# Patient Record
Sex: Male | Born: 1955
Health system: Southern US, Community
[De-identification: ages and names within clinical notes are randomized; demographics above are authoritative.]

## PROBLEM LIST (undated history)

## (undated) DIAGNOSIS — F32A Depression, unspecified: Secondary | ICD-10-CM

## (undated) DIAGNOSIS — R569 Unspecified convulsions: Secondary | ICD-10-CM

## (undated) DIAGNOSIS — I639 Cerebral infarction, unspecified: Secondary | ICD-10-CM

## (undated) DIAGNOSIS — G4730 Sleep apnea, unspecified: Secondary | ICD-10-CM

## (undated) DIAGNOSIS — F329 Major depressive disorder, single episode, unspecified: Secondary | ICD-10-CM

## (undated) DIAGNOSIS — I10 Essential (primary) hypertension: Secondary | ICD-10-CM

## (undated) DIAGNOSIS — M1990 Unspecified osteoarthritis, unspecified site: Secondary | ICD-10-CM

## (undated) DIAGNOSIS — E7800 Pure hypercholesterolemia, unspecified: Secondary | ICD-10-CM

## (undated) HISTORY — PX: BICEPS TENDON REPAIR: SHX566

## (undated) HISTORY — PX: KNEE ARTHROSCOPY: SUR90

## (undated) HISTORY — PX: ANTERIOR CRUCIATE LIGAMENT REPAIR: SHX115

## (undated) HISTORY — PX: NEPHRECTOMY: SHX65

## (undated) HISTORY — PX: CARPAL TUNNEL RELEASE: SHX101

---

## 2000-12-29 ENCOUNTER — Ambulatory Visit (HOSPITAL_BASED_OUTPATIENT_CLINIC_OR_DEPARTMENT_OTHER): Admission: RE | Admit: 2000-12-29 | Discharge: 2000-12-29 | Payer: Self-pay | Admitting: Orthopedic Surgery

## 2002-06-21 ENCOUNTER — Ambulatory Visit (HOSPITAL_BASED_OUTPATIENT_CLINIC_OR_DEPARTMENT_OTHER): Admission: RE | Admit: 2002-06-21 | Discharge: 2002-06-22 | Payer: Self-pay | Admitting: Orthopedic Surgery

## 2003-02-23 ENCOUNTER — Emergency Department (HOSPITAL_COMMUNITY): Admission: EM | Admit: 2003-02-23 | Discharge: 2003-02-23 | Payer: Self-pay | Admitting: Emergency Medicine

## 2004-05-21 ENCOUNTER — Emergency Department (HOSPITAL_COMMUNITY): Admission: EM | Admit: 2004-05-21 | Discharge: 2004-05-21 | Payer: Self-pay | Admitting: Emergency Medicine

## 2004-05-24 ENCOUNTER — Ambulatory Visit (HOSPITAL_COMMUNITY): Admission: RE | Admit: 2004-05-24 | Discharge: 2004-05-24 | Payer: Self-pay | Admitting: Urology

## 2005-01-07 ENCOUNTER — Encounter (INDEPENDENT_AMBULATORY_CARE_PROVIDER_SITE_OTHER): Payer: Self-pay | Admitting: *Deleted

## 2005-01-07 ENCOUNTER — Ambulatory Visit (HOSPITAL_COMMUNITY): Admission: RE | Admit: 2005-01-07 | Discharge: 2005-01-07 | Payer: Self-pay | Admitting: Gastroenterology

## 2007-12-30 ENCOUNTER — Inpatient Hospital Stay
Admission: EM | Admit: 2007-12-30 | Attending: Orthopaedic Surgery of the Spine | Admitting: Orthopaedic Surgery of the Spine

## 2007-12-30 DIAGNOSIS — S52509A Unspecified fracture of the lower end of unspecified radius, initial encounter for closed fracture: Secondary | ICD-10-CM | POA: Insufficient documentation

## 2007-12-30 MED ORDER — BISACODYL 10 MG RECTAL SUPPOSITORY
10.0000 mg | Freq: Two times a day (BID) | RECTAL | Status: DC | PRN
Start: 2007-12-30 — End: 2008-01-01

## 2007-12-30 MED ORDER — ACETAMINOPHEN 325 MG TABLET
650.0000 mg | ORAL_TABLET | ORAL | Status: DC | PRN
Start: 2007-12-30 — End: 2008-01-01

## 2007-12-30 MED ORDER — DOCUSATE SODIUM 100 MG CAPSULE
100.0000 mg | ORAL_CAPSULE | Freq: Two times a day (BID) | ORAL | Status: DC
Start: 2007-12-30 — End: 2008-01-01
  Administered 2007-12-31 – 2008-01-01 (×2): 100 mg via ORAL
  Filled 2007-12-30 (×2): qty 1

## 2007-12-30 MED ORDER — VANCOMYCIN 500 MG INTRAVENOUS SOLUTION
1000.0000 mg | INTRAVENOUS | Status: AC
Start: 2007-12-30 — End: 2007-12-31
  Administered 2007-12-31: 1000 mg via INTRAVENOUS

## 2007-12-30 MED ORDER — LORAZEPAM 2 MG/ML INJECTION SOLUTION
1.0000 mg | INTRAMUSCULAR | Status: DC | PRN
Start: 2007-12-30 — End: 2008-01-01

## 2007-12-30 MED ORDER — CEFAZOLIN 1 GRAM SOLUTION FOR INJECTION
1.0000 g | INTRAMUSCULAR | Status: AC
Start: 2007-12-30 — End: 2007-12-31
  Administered 2007-12-31: 1 g via INTRAVENOUS

## 2007-12-30 MED ORDER — THIAMINE HCL (VITAMIN B1) 100 MG/ML INJECTION SOLUTION
Freq: Once | INTRAMUSCULAR | Status: AC
Start: 2007-12-30 — End: 2007-12-30
  Administered 2007-12-30: 07:00:00 via INTRAVENOUS

## 2007-12-30 MED ORDER — THIAMINE HCL (VITAMIN B1) 100 MG/ML INJECTION SOLUTION
100.0000 mg | Freq: Once | INTRAMUSCULAR | Status: DC
Start: 2007-12-30 — End: 2007-12-30

## 2007-12-30 MED ORDER — MAGNESIUM HYDROXIDE 400 MG/5 ML ORAL SUSPENSION
30.0000 mL | Freq: Two times a day (BID) | ORAL | Status: DC | PRN
Start: 2007-12-30 — End: 2008-01-01

## 2007-12-30 MED ORDER — ONDANSETRON HCL (PF) 4 MG/2 ML INJECTION SOLUTION
4.0000 mg | Freq: Two times a day (BID) | INTRAMUSCULAR | Status: DC | PRN
Start: 2007-12-30 — End: 2008-01-01

## 2007-12-30 MED ORDER — MORPHINE 2 MG/ML INJECTION SYRINGE
4.0000 mg | INJECTION | Freq: Once | INTRAMUSCULAR | Status: AC
Start: 2007-12-30 — End: 2007-12-30
  Administered 2007-12-30: 4 mg via INTRAVENOUS

## 2007-12-30 MED ORDER — DIPHENHYDRAMINE 50 MG/ML INJECTION SOLUTION
25.0000 mg | Freq: Four times a day (QID) | INTRAMUSCULAR | Status: DC | PRN
Start: 2007-12-30 — End: 2008-01-01

## 2007-12-30 MED ORDER — MULTIVITAMIN INTRAVENOUS
10.0000 mL | INJECTION | Freq: Once | INTRAVENOUS | Status: DC
Start: 2007-12-30 — End: 2007-12-30

## 2007-12-30 MED ORDER — MORPHINE 2 MG/ML INJECTION SYRINGE
2.0000 mg | INJECTION | Freq: Once | INTRAMUSCULAR | Status: AC
Start: 2007-12-30 — End: 2007-12-30
  Administered 2007-12-30 (×2): 4 mg via INTRAVENOUS
  Administered 2007-12-30: 2 mg via INTRAVENOUS

## 2007-12-30 MED ORDER — HYDROMORPHONE (PF) 1 MG/ML INJECTION SYRINGE
0.4000 mg | INJECTION | INTRAMUSCULAR | Status: DC | PRN
Start: 2007-12-30 — End: 2008-01-01
  Administered 2007-12-30: 2 mg via INTRAVENOUS
  Administered 2007-12-31 (×4): 1 mg via INTRAVENOUS
  Filled 2007-12-30 (×5): qty 1

## 2007-12-30 MED ORDER — D5 / 0.45% NACL W KCL 20 MEQ/L IV SOLUTION
INTRAVENOUS | Status: DC
Start: 2007-12-30 — End: 2008-01-01
  Administered 2007-12-30 – 2007-12-31 (×3): via INTRAVENOUS

## 2007-12-30 MED ORDER — OXYCODONE-ACETAMINOPHEN 5 MG-325 MG TABLET
1.0000 | ORAL_TABLET | ORAL | Status: DC | PRN
Start: 2007-12-30 — End: 2008-01-01
  Administered 2007-12-30 – 2008-01-01 (×8): 2 via ORAL
  Filled 2007-12-30 (×8): qty 2

## 2007-12-30 MED ORDER — FOLIC ACID 5 MG/ML INJECTION SOLUTION
1.0000 mg | Freq: Once | INTRAMUSCULAR | Status: DC
Start: 2007-12-30 — End: 2007-12-30

## 2007-12-30 NOTE — ED Nursing Note (Signed)
Pt resting more comfortably at present, awaiting ortho consult.  Wife at [facility], cont to monitor, CSM rem intact.

## 2007-12-30 NOTE — Progress Notes (Signed)
PATIENT: Jeremiah Glover, Jeremiah Glover LOCATIONCoy [place]as  MR #: [id number] SEX: M AGE: 52  DATE OF SERVICE: 12/30/2007 DOB: February 15, 1956      EMERGENCY DEPARTMENT NOTE    This patient was seen and examined concurrently with Dr. Cyndra Numbers. The history, past medical history, review of systems, and physical exam were reviewed and discussed, and a care plan was developed together.     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 He had no loss of consciousness or any other injuries. He complains of severe, sharp, nonradiating, right wrist pain that hurts worse with any palpation or movement.    PAST MEDICAL HISTORY:    Hyperthyroidism. SURGICAL HISTORY: None. MEDICATIONS: None. ALLERGIES: None.    FAMILY HISTORY: Noncontributory.    SOCIAL HISTORY:    Positive alcohol.     REVIEW OF SYSTEMS:     A complete ROS is negative for everything except for right wrist pain.    PHYSICAL EXAM:     Vital signs: Heart rate 96, blood pressure 130/84, respirations 16, temperature 36.6. General: He is comfortable and conversational. HEENT: Conjunctivae pink, anicteric. Oropharynx normal. Neck: Supple. Chest: Clear. Heart: Regular rate and rhythm. Abdomen: Soft, nontender. Pelvis nontender. Extremities: Atraumatic, nontender except for the right wrist, which has a visible dorsal deformity and is tender over the entire wrist. His elbow and hand are nontender. He has no shoulder tenderness. He has intact distal pulses and compartments are soft. Neuro: GCS 15. Sensation: Motor exam normal all extremities, including median, radial, and ulnar nerves of the right hand.     ASSESSMENT AND PLAN:    Right wrist fracture. We will evaluate him with x-rays and consult orthopaedics for reduction and followup.    ED COURSE:    His x-ray showed a comminuted, intraarticular, distal radius fracture with significant dorsal displacement and angulation. Orthopaedics was consulted and initially did a hematoma  block and hung him for 15 minutes of traction. We then did procedural sedation with Brevital without complications and orthopaedics reduced the fracture and splinted him with a reverse sugar-tong splint. He was admitted to orthopaedics for further management and operative care.    FINAL ED DIAGNOSIS:    Right distal radius intraarticular, comminuted, displaced, and angulated fracture.            THIS WAS ELECTRONICALLY SIGNED - 12/30/2007 6:59 AM PST BY: Diego Cory, MD  ASSOCIATE CLINICAL [facility]  EMERGENCY MEDICINE DEPARTMENT              [id number])    D: 12/30/2007 06:26 AM  T: 12/30/2007 06:33 AM  C#: [id number]

## 2007-12-30 NOTE — ED Triage Note (Signed)
Rt wrist pain, fell while skating, edema noted. Strong pulse noted, fingers pink with 2 sec capillary refillm cms intact. Ice packed to area involved. Occurred 45 mins ago

## 2007-12-30 NOTE — ED Nursing Note (Signed)
Verbal order received from ortho to give morphine 4mg  ivp. Pt awake, c/o 10/10 right wrist pain.

## 2007-12-30 NOTE — ED Nursing Note (Signed)
Procedural sedation tolerated well.

## 2007-12-30 NOTE — ED Nursing Note (Signed)
Procedural sedation initiated. ED attending at bedside.

## 2007-12-30 NOTE — ED Nursing Note (Signed)
Pt BIB by wife s/p fall while roller skating. Pt w/ deformity, pain and swelling to right wrist, +CSM, hand is warm, cap refill brisk, pt in obvious discomfort, wife at [facility], no other injuries, no other complaints, no LOC.

## 2007-12-30 NOTE — ED Nursing Note (Signed)
Pt awake, foolows commands. To ct scan.

## 2007-12-30 NOTE — ED Nursing Note (Signed)
Ortho at bedside.

## 2007-12-30 NOTE — ED Nursing Note (Signed)
Pt back from xray in NAD at present, some relief noted after morphine.  CSM remains intact.  Ortho paged.

## 2007-12-30 NOTE — ED Nursing Note (Signed)
Back from xray. Unchanged assessments.

## 2007-12-30 NOTE — ED Nursing Note (Signed)
Medicated with 2 mg of morphine for 8/10 pain at right wrist.

## 2007-12-30 NOTE — ED Nursing Note (Signed)
Pt resting comfortably. Awaiting dispo

## 2007-12-31 MED ORDER — MEPERIDINE (PF) 50 MG/ML INJECTION SOLUTION
25.0000 mg | INTRAMUSCULAR | Status: DC | PRN
Start: 2007-12-31 — End: 2007-12-31

## 2007-12-31 MED ORDER — LACTATED RINGERS IV INFUSION
INTRAVENOUS | Status: DC
Start: 2007-12-31 — End: 2007-12-31
  Administered 2007-12-31: 10:00:00 via INTRAVENOUS

## 2007-12-31 MED ORDER — LACTATED RINGERS IV INFUSION
INTRAVENOUS | Status: DC
Start: 2007-12-31 — End: 2007-12-31

## 2007-12-31 MED ORDER — HEPARIN, PORCINE (PF) 5,000 UNIT/0.5 ML INJECTION SYRINGE
5000.0000 [IU] | INJECTION | Freq: Three times a day (TID) | INTRAMUSCULAR | Status: DC
Start: 2007-12-31 — End: 2008-01-01
  Administered 2007-12-31 – 2008-01-01 (×3): 5000 [IU] via SUBCUTANEOUS
  Filled 2007-12-31 (×3): qty 1

## 2007-12-31 MED ORDER — VANCOMYCIN 500 MG INTRAVENOUS SOLUTION
1000.0000 mg | Freq: Two times a day (BID) | INTRAVENOUS | Status: AC
Start: 2007-12-31 — End: 2007-12-31
  Administered 2007-12-31: 1000 mg via INTRAVENOUS
  Filled 2007-12-31: qty 20

## 2007-12-31 MED ORDER — ONDANSETRON HCL (PF) 4 MG/2 ML INJECTION SOLUTION
4.0000 mg | INTRAMUSCULAR | Status: DC | PRN
Start: 2007-12-31 — End: 2007-12-31

## 2007-12-31 MED ORDER — PROMETHAZINE 25 MG/ML INJECTION SOLUTION
12.5000 mg | INTRAMUSCULAR | Status: DC | PRN
Start: 2007-12-31 — End: 2007-12-31

## 2007-12-31 MED ORDER — VANCOMYCIN 1 GRAM/200 ML IN DEXTROSE 5 % INTRAVENOUS PIGGYBACK
INJECTION | INTRAVENOUS | Status: AC
Start: 2007-12-31 — End: ?
  Filled 2007-12-31: qty 200

## 2007-12-31 MED ORDER — ALBUTEROL SULFATE 2.5 MG/3 ML (0.083 %) SOLUTION FOR NEBULIZATION
2.5000 mg | INHALATION_SOLUTION | RESPIRATORY_TRACT | Status: DC | PRN
Start: 2007-12-31 — End: 2007-12-31

## 2007-12-31 MED ORDER — HYDROMORPHONE (PF) 1 MG/ML INJECTION SYRINGE
0.4000 mg | INJECTION | INTRAMUSCULAR | Status: DC | PRN
Start: 2007-12-31 — End: 2007-12-31

## 2007-12-31 MED ORDER — HYDROMORPHONE (PF) 1 MG/ML INJECTION SYRINGE
0.2000 mg | INJECTION | INTRAMUSCULAR | Status: DC | PRN
Start: 2007-12-31 — End: 2007-12-31

## 2007-12-31 MED ORDER — MEPERIDINE (PF) 50 MG/ML INJECTION SOLUTION
12.5000 mg | INTRAMUSCULAR | Status: DC | PRN
Start: 2007-12-31 — End: 2007-12-31

## 2007-12-31 MED ORDER — CEFAZOLIN 1 GRAM SOLUTION FOR INJECTION
1.0000 g | Freq: Three times a day (TID) | INTRAMUSCULAR | Status: AC
Start: 2007-12-31 — End: 2008-01-01
  Administered 2007-12-31 – 2008-01-01 (×2): 1 g via INTRAVENOUS
  Filled 2007-12-31 (×2): qty 1

## 2007-12-31 NOTE — Procedures (Addendum)
PATIENT: Jeremiah Glover, Jeremiah Glover LOCATION:   MR #: [id number] SEX: M AGE: 52  OPERATION DATE: 12/31/2007 DOB: [birth year]    INPATIENT OPERATION RECORD    PREOPERATIVE DIAGNOSIS:    Right intraarticular distal radius fracture.     POSTOPERATIVE DIAGNOSIS: Same.    PROCEDURE PERFORMED:    Open reduction and internal fixation of right distal radius fracture.    CPT CODE: 09811   ANESTHESIA: General.  FINDINGS: As above.  SPECIMENS REMOVED: None.  EBL: 50 mL.  DRAINS: None.  FLUIDS: Per anesthesia.  COMPLICATIONS: None.  OUTCOME: Satisfactory.    INDICATIONS:    The patient is a gentleman who is status post fall onto his right upper extremity. Sustained a right distal radius fracture. Seen in the emergency department and felt to be a good candidate for surgical stabilization.     DETAILS OF OPERATION:    After consent was obtained and preoperative antibiotics were given, the patient was taken to the operating room, where general anesthesia was induced. His right arm was then prepped and draped in the usual sterile fashion. Next, we then performed surgical pause, confirming surgical site, procedure, and the patient. This was confirmed by all surgical team members in attendance. Next, we used a standard FCR approach. Dissected sharply through the skin down to the level of the FCR. We dissected through the FCR tendon sheath, reflected the tendon laterally. We exposed our FDS fascia, incised the fascia longitudinally, and then reflected the FDS off of the distal radius down to the pronator space and identifying pronator quadratus. We then reflected the pronator quadratus with an L incision off the distal radius. We carefully dissected around the distal radius, releasing some of the brachioradialis to the tip of the radial styloid. We placed deep retractors, identified our fracture fragments. We cleaned out our fracture fragments with curette and irrigation. We then achieved manual reduction and placed provisional fixation with 1.6  K-wire fixation for the radial styloid tip into the radial shaft. We then prepared for plating of our distal radius fracture using a [employer] plate. We affixed the plate proximally with locking screws, checking length and screw positioning and making sure they were extraarticular and just below the subchondral surface on the AP and lateral fluoroscopic views. We then placed our proximal shaft screws, bicortical in nature, with three proximal screws. Once all the screws were placed, we checked fluoroscopy in a biplanar fashion. All screw lengths were appropriate. There was no evidence of intraarticular screw placement. The patient had good range of motion of the wrist, full supination and pronation. There was no evidence of DRUJ disruption. His radial pulse was intact. We then irrigated the deep wounds. We closed the PQ over the plate with 0 Vicryl. We then closed the subcu with 2-0 Vicryl in an inverted fashion. Closed with skin with 3-0 nylon in vertical mattress fashion. A sterile dressing was placed and a volar splint was applied. The patient was then awakened from anesthesia and transferred to the postanesthesia care unit in stable condition.     Postoperatively he will be nonweightbearing on the right upper extremity, but range of motion as tolerated once the splint comes down. He will be discharged to home when comfortable and follow up in orthopedic clinic in one week post discharge.         THIS WAS ELECTRONICALLY SIGNED - 01/17/2008 3:59 PM PST BY: MARK A LEE, MD  SURGEON      THIS WAS ELECTRONICALLY SIGNED - 01/11/2008  9:49 AM PST BY: Ann Held, MD  ASSISTANT SURGEON   DEPARTMENT OF ORTHOPAEDIC SURGERY      DR. Serita Kyle  ASSISTANT SURGEON       [id number])    D: 12/31/2007 12:55 PM  T: 12/31/2007 06:08 PM  C#: [id number]

## 2008-01-01 LAB — BASIC METABOLIC PANEL
Calcium: 8.6 mg/dL (ref 8.6–10.5)
Carbon Dioxide Total: 32 meq/L (ref 24–32)
Chloride: 102 meq/L (ref 95–110)
Creatinine Blood: 1.24 mg/dL (ref 0.44–1.27)
E-GFR, African American: >60 SEE NOTE (ref >60)
E-GFR, Non-African American: >60 SEE NOTE (ref >60)
Glucose: 104 mg/dL (ref 70–110)
Potassium: 3.8 meq/L (ref 3.3–5.0)
Sodium: 140 meq/L (ref 135–145)
Urea Nitrogen, Blood (BUN): 9 mg/dL (ref 8–22)

## 2008-01-01 LAB — CBC WITH DIFFERENTIAL
Basophils % Auto: 0.3 %
Basophils Abs Auto: 0 K/MM3 (ref 0–0.2)
Eosinophils % Auto: 0.3 %
Eosinophils Abs Auto: 0 K/MM3 (ref 0–0.5)
Hematocrit: 41 % (ref 41–53)
Hemoglobin: 13.2 g/dL — ABNORMAL LOW (ref 13.5–17.5)
Lymphocytes % Auto: 28.9 %
Lymphocytes Abs Auto: 3.5 K/MM3 (ref 1.0–4.8)
MCH: 24.3 pg — ABNORMAL LOW (ref 27–33)
MCHC: 32.2 % (ref 32–36)
MCV: 75.4 UM3 — ABNORMAL LOW (ref 80–100)
MPV: 9.2 UM3 (ref 6.8–10.0)
Monocytes % Auto: 7.9 %
Monocytes Abs Auto: 1 K/MM3 — ABNORMAL HIGH (ref 0.1–0.8)
Neutrophils % Auto: 62.6 %
Neutrophils Abs Auto: 7.7 K/MM3 (ref 1.80–7.70)
Platelet Count: 161 K/MM3 (ref 130–400)
RDW: 14.3 U (ref 0–14.7)
Red Blood Cell Count: 5.44 M/MM3 (ref 4.5–5.9)
White Blood Cell Count: 12.2 K/MM3 — ABNORMAL HIGH (ref 4.5–11.0)

## 2008-01-01 MED ORDER — OXYCODONE-ACETAMINOPHEN 5 MG-325 MG TABLET
ORAL_TABLET | ORAL | Status: AC
Start: 2008-01-01 — End: ?

## 2008-01-01 MED ORDER — DOCUSATE SODIUM 100 MG CAPSULE
ORAL_CAPSULE | ORAL | Status: AC
Start: 2008-01-01 — End: ?

## 2008-01-01 NOTE — Patient Instructions (Signed)
Keep your splint clean and dry at all times.  Elevate at the level of the heart while at rest.  Wear the sling for comfort while walking.  Remain non weight bearing on your right arm.    Should you develop increasing numbness or tingling in your hand or arm, please notify your orthopedic surgeon right away or go to emergency for evaluation.  Monitor for symptoms of infection:  Fever, warmth, redness, swelling of the hand or digits, any concerning symptoms, please call your orthopedic doctor right away or come to emergency for evaluation.

## 2008-01-01 NOTE — Discharge Summary (Addendum)
PATIENT: Jeremiah Glover, Jeremiah Glover LOCATION: [id number]  MR #: [id number] SEX: M AGE: 52   DOB: 10/15/1956  ADMISSION DATE: 12/30/2007 DISCHARGE DATE: 01/01/2008    INPATIENT DISCHARGE SUMMARY    ADMISSION DIAGNOSIS:    Right intra-articular distal radius fracture.    DISCHARGE DIAGNOSIS:    Same.    PROCEDURES:  12/31/07, ORIF Right distal radius, Dr. Laurell Roof.    PERTINENT IMAGINGS:    December 30, 2007, x-ray right wrist, comminuted intra-articular fracture, right distal radius; probable injury to the distal radioulnar joint. X-ray, right hand, with acute intra-articular comminuted fracture of the distal radius. X-ray elbow: No traumatic abnormalities, osteoarthritic changes. X-ray right forearm, acute comminuted intra-articular fracture of the distal radius. CT upper extremity: Status post closed reduction, intra-articular distal radius fracture.    December 31, 2007, x-ray right wrist, status post open reduction and internal fixation of the distal radius, near anatomic.    CONSULTATIONS: None.    COMPLICATIONS:    None identified.    HISTORY OF PRESENT ILLNESS:    Mr. Jeremiah Glover is a 52 year old man who fell onto a right outstretched hand on December 30, 2007, while roller skating. He was taken to the OR the following day for surgical stabilization.    PAST MEDICAL HISTORY:    Reveals no known drug allergies, history of hypothyroidism. No past surgery or medications were noted. Social habits notable for alcohol.    HOSPITAL COURSE:    Jeremiah Glover remained in the hospital overnight on January 26 after his surgery. He was recommended to be non-weight-bearing on his right upper extremity in a short arm splint with a sling for comfort. Estimated blood loss was 50 mL. He was recommended to follow up in the orthopedic clinic in approximately two weeks for evaluation. I discussed the signs of postoperative wound infection for which he should monitor himself for. I advised him to call the orthopedic clinic right away or come to the  emergency for any concerning symptoms. He was instructed to keep the splint clean and dry. He did report some numbness and tingling to the fourth and fifth fingers; however, he was able to move all fingers. I discussed with him the importance of finger range of motion after his hospital discharge to avoid stiffness.    Review of vital signs today shows that he is afebrile with temperature of 36.7, blood pressure 135/87, pulse 81, respirations 18, satting 96% on room air. Pain scores range from 4 to 7/10, which was well tolerated with oral pain medications. Jeremiah Glover was discharged home today accompanied by his wife in stable condition with pain adequately controlled. He was tolerating a regular diet, voiding adequately, and no complaints of nausea or vomiting.    PLAN:    Discharge home.    CONDITION: Stable.    ACTIVITY:    Non-weight-bearing right upper extremity, keeping the short-arm splint clean and dry, utilizing a sling for comfort.    MEDICATIONS PROVIDED AT DISCHARGE:    1. Percocet 5/325 mg tablets one to two p.o. q.4 hours p.r.n. pain, #80.  2. Colace 100 mg tablets p.o. b.i.d., #60.        THIS WAS ELECTRONICALLY SIGNED - 01/17/2008 3:57 PM PST BY: MARK A LEE, MD    THIS WAS ELECTRONICALLY SIGNED - 01/16/2008 3:17 PM PST BY: Leo Grosser, NP  NURSE PRACTITIONER  DEPARTMENT OF ORTHOPAEDIC SURGERY      [id number])    D: 01/01/2008  T: 01/01/2008 09:35 PM   C#: [id number]

## 2008-01-03 ENCOUNTER — Ambulatory Visit

## 2008-01-11 ENCOUNTER — Ambulatory Visit

## 2008-01-11 ENCOUNTER — Ambulatory Visit: Admitting: Orthopaedic Trauma

## 2008-01-11 NOTE — Progress Notes (Signed)
This patient was seen, evaluated, and care plan was developed with the resident. I agree with the findings and plan as outlined in the resident's note above.     Olevia Bowens

## 2008-01-11 NOTE — Nursing Note (Signed)
>>   Jeremiah Glover     Fri Jan 11, 2008 12:09 PM  Online order # [id number] for OOP exam.  Removed SAS in CR5 for exam.    Voice Order for suture removal,steri-strips and wrist splint.    Removed Sutures and applied steri-strips. Freedom wrist splint applied on wrist.  Return as scheduled.  Jeremiah Glover  Sr. Ortho Tech

## 2008-01-17 NOTE — Progress Notes (Addendum)
PATIENT: Jeremiah Glover, Jeremiah Glover LOCATIONManson Passey   MR #: [id number] SEX: M AGE: 52  DATE OF SERVICE: 01/11/2008 DOB: 1956/06/05    ORTHOPAEDICS CLINIC NOTE    ATTENDING PHYSICIAN: Dr. Laurell Roof.    ID: Jeremiah Glover is a 52 year old gentleman who sustained a right distal radius fracture. He underwent open reduction/internal fixation of his right distal radius on December 31, 2007. He is here for a wound check follow-up visit.    SUBJECTIVE: The patient states that he has been doing well since the time of his discharge. He has no complaints of fevers, sweats, or chills. He states that his pain has been well controlled with Percocet. He is also requesting a refill on Percocet.    OBJECTIVE: On physical examination, his incision is clean, dry, and intact, with sutures in place. There is no erythema. There is no warmth or drainage at the site of his incision. Distally he has good sensation to light touch in the median, ulnar, and radial nerve distributions and is able to wiggle his fingers. He has decreased range of motion in his wrist and fingers secondary to being immobilized.    IMAGING STUDIES: X-rays were not done today, as he is here for a postop check.    ASSESSMENT & PLAN: Jeremiah Glover is a 52 year old gentleman with a right distal radius fracture status post open reduction/internal fixation. He is now a week and a half out. At this time, we will start him on range of motion exercises. We will have him follow up in five weeks with x-rays. We feel like he is doing well at this time. We have instructed him that he should return if he has increased pain or swelling and erythema to his right wrist.        THIS WAS ELECTRONICALLY SIGNED - 01/16/2008 9:15 AM PST BY: Wilma Flavin, MD  RESIDENT  DEPARTMENT OF ORTHOPAEDIC SURGERY        THIS WAS ELECTRONICALLY SIGNED - 01/17/2008 3:57 PM PST BY: Elta Guadeloupe A LEE, MD      [id number])    D: 01/11/2008 09:37 AM  T: 01/11/2008 10:44 AM  C#: [id number]

## 2008-02-15 ENCOUNTER — Ambulatory Visit: Admitting: Orthopaedic Trauma

## 2008-02-15 ENCOUNTER — Other Ambulatory Visit: Payer: Self-pay | Admitting: Orthopaedic Trauma

## 2008-02-15 ENCOUNTER — Ambulatory Visit

## 2008-02-15 MED ORDER — VICODIN 5 MG-500 MG TABLET
ORAL_TABLET | ORAL | Status: AC
Start: 2008-02-15 — End: ?

## 2008-02-15 NOTE — Progress Notes (Addendum)
Addended by: Dina Rich on: 02/15/2008 10:07:01 AM     Modules accepted: Orders

## 2008-02-15 NOTE — Progress Notes (Addendum)
This patient was seen, evaluated, and care plan was developed with the resident. I agree with the findings and plan as outlined in the resident's note above.     Gautam Langhorst,  M.D.

## 2008-02-15 NOTE — Nursing Note (Signed)
>>   Jeremiah Glover     Fri Feb 15, 2008 11:26 AM  Written Order for new freedom wrist splint.  Splint applied.  Return as scheduled.  Jeremiah Glover  Sr. Ortho Tech    >> AMY CROWTHER, MA     Fri Feb 15, 2008  9:16 AM  Screened for pain, allergies reviewed, pharmacy info updated.  Cyndee Brightly MA

## 2008-02-26 NOTE — Progress Notes (Addendum)
PATIENT: Jeremiah Glover, Jeremiah Glover LOCATIONManson Passey   MR #: [id number] SEX: M AGE: 52  DATE OF SERVICE: 02/15/2008 DOB: 09/17/1956    ORTHOPAEDICS CLINIC NOTE    SUBJECTIVE: Mr. Lampman is a 52 year old man who sustained a right distal radius fracture and underwent an open reduction and internal fixation of a right distal radius fracture on 12/31/07. He is now here 8 weeks after this. He has been wearing a Freedom wrist splint and using his wrist minimally. He states his pain has been improving with time but he does have some mild achiness with increasing use.     OBJECTIVE: On exam the incision is well healed. He flexes and extends all of his fingers without evidence of tendon rupture. Sensation is intact to light touch to the superficial nerves of the hand. He is motor intact in median, ulnar, and radial nerves.    RADIOGRAPHS: Radiographs of the wrists show volar lock plate in good position. No interval loss of fixation. There has been interval healing of the fracture.    ASSESSMENT AND PLAN: Eight weeks after open reduction and internal fixation of right distal radius fracture. He also has approximately 10 degrees of dorsiflexion and 10 degrees or volar flexion. We will have him placed in hand therapy to work on wrist range of motion and strengthening. He should not lift more than five pounds with this extremity and we advised him not to return to golf at this time as his fracture is not stable enough to withstand that. We will have him come back in six weeks' time at which point we will repeat radiographs of his right wrist. If his range of motion, strength and fracture consolidation improve then he may consider advancing his weightbearing and activities at that time. This patient is seen and examined with Dr. Laurell Roof. He is also provided with a new Freedom wrist splint today.           THIS WAS ELECTRONICALLY SIGNED - 02/26/2008 11:53 AM PST BY: Serita Kyle, MD  RESIDENT  DEPARTMENT OF ORTHOPAEDIC SURGERY        THIS WAS  ELECTRONICALLY SIGNED - 02/26/2008 4:31 PM PST BY: MARK A LEE, MD      [id number])    D: 02/15/2008 02:25 PM  T: 02/17/2008 09:52 AM  C#: [id number]

## 2008-03-28 ENCOUNTER — Ambulatory Visit

## 2008-03-28 ENCOUNTER — Other Ambulatory Visit: Payer: Self-pay | Admitting: Orthopaedic Trauma

## 2008-03-28 ENCOUNTER — Ambulatory Visit: Admitting: Orthopaedic Trauma

## 2008-03-28 VITALS — BP 142/95 | HR 73 | Resp 18

## 2008-03-28 NOTE — Nursing Note (Signed)
>>   Elam Dutch, RN     Ludwig Clarks Mar 28, 2008  9:24 AM  Pharmacy/allergies verified, screened for pain.   Elam Dutch, RN

## 2008-04-10 NOTE — Progress Notes (Addendum)
PATIENT: Jeremiah Glover, Jeremiah Glover LOCATIONManson Passey   MR #: [id number] SEX: M AGE: 52  DATE OF SERVICE: 03/28/2008 DOB: 25-Sep-1956    ORTHOPAEDICS CLINIC NOTE    ATTENDING: Dr. Truman Hayward, [facility].    SUBJECTIVE: The patient is a 52 year old man who is right-handed. He had a right distal radius fracture. He is now three months out. He is doing great. He has no problems, no issues.    OBJECTIVE EXAM: His motion is almost identical to his contralateral side. He has no pain to palpation. His incisions have healed.    X-RAYS: X-rays show healing of his fracture. Hardware is in place. Joint looks fine.    ASSESSMENT AND PLAN: Healed distal radius fracture. Patient should be cleared for activities as tolerated. He may wean himself off of his Freedom wrist splint. We will see him back on a p.r.n. basis.          THIS WAS ELECTRONICALLY SIGNED - 03/28/2008 1:27 PM PST BY: Leticia Penna, MD  FELLOW  DIVISION OF ORTHOPAEDIC TRAUMA  DEPARTMENT OF ORTHOPAEDIC SURGERY        THIS WAS ELECTRONICALLY SIGNED - 04/10/2008 1:04 PM PST BY: MARK A LEE, MD      [id number])    D: 03/28/2008 10:20 AM  T: 03/28/2008 11:10 AM  C#: [id number]

## 2010-12-14 ENCOUNTER — Ambulatory Visit (HOSPITAL_COMMUNITY)
Admission: RE | Admit: 2010-12-14 | Discharge: 2010-12-14 | Payer: Self-pay | Source: Home / Self Care | Attending: Urology | Admitting: Urology

## 2010-12-22 ENCOUNTER — Inpatient Hospital Stay (HOSPITAL_COMMUNITY)
Admission: RE | Admit: 2010-12-22 | Discharge: 2010-12-25 | Payer: Self-pay | Source: Home / Self Care | Attending: Urology | Admitting: Urology

## 2010-12-22 ENCOUNTER — Encounter: Payer: Self-pay | Admitting: Urology

## 2010-12-22 LAB — TYPE AND SCREEN
ABO/RH(D): O POS
Antibody Screen: NEGATIVE

## 2010-12-22 LAB — CBC
HCT: 39.6 % (ref 39.0–52.0)
Hemoglobin: 13.1 g/dL (ref 13.0–17.0)
MCH: 28.3 pg (ref 26.0–34.0)
MCHC: 33.1 g/dL (ref 30.0–36.0)
MCV: 85.5 fL (ref 78.0–100.0)
Platelets: 223 10*3/uL (ref 150–400)
RBC: 4.63 MIL/uL (ref 4.22–5.81)
RDW: 13.7 % (ref 11.5–15.5)
WBC: 6.9 10*3/uL (ref 4.0–10.5)

## 2010-12-22 LAB — COMPREHENSIVE METABOLIC PANEL
ALT: 25 U/L (ref 0–53)
AST: 22 U/L (ref 0–37)
Albumin: 4.3 g/dL (ref 3.5–5.2)
Alkaline Phosphatase: 37 U/L — ABNORMAL LOW (ref 39–117)
BUN: 19 mg/dL (ref 6–23)
CO2: 27 mEq/L (ref 19–32)
Calcium: 9.7 mg/dL (ref 8.4–10.5)
Chloride: 108 mEq/L (ref 96–112)
Creatinine, Ser: 1.23 mg/dL (ref 0.4–1.5)
GFR calc Af Amer: >60 mL/min (ref >60)
GFR calc non Af Amer: >60 mL/min (ref >60)
Glucose, Bld: 97 mg/dL (ref 70–99)
Potassium: 4.4 mEq/L (ref 3.5–5.1)
Sodium: 142 mEq/L (ref 135–145)
Total Bilirubin: 0.6 mg/dL (ref 0.3–1.2)
Total Protein: 7.1 g/dL (ref 6.0–8.3)

## 2010-12-22 LAB — ABO/RH: ABO/RH(D): O POS

## 2010-12-22 LAB — SURGICAL PCR SCREEN
MRSA, PCR: NEGATIVE
Staphylococcus aureus: NEGATIVE

## 2010-12-27 LAB — CBC
HCT: 32.1 % — ABNORMAL LOW (ref 39.0–52.0)
HCT: 30 % — ABNORMAL LOW (ref 39.0–52.0)
Hemoglobin: 10.7 g/dL — ABNORMAL LOW (ref 13.0–17.0)
Hemoglobin: 10.1 g/dL — ABNORMAL LOW (ref 13.0–17.0)
MCH: 28.3 pg (ref 26.0–34.0)
MCH: 28.1 pg (ref 26.0–34.0)
MCHC: 33.3 g/dL (ref 30.0–36.0)
MCHC: 33.7 g/dL (ref 30.0–36.0)
MCV: 84.9 fL (ref 78.0–100.0)
MCV: 83.6 fL (ref 78.0–100.0)
Platelets: 190 10*3/uL (ref 150–400)
Platelets: 207 10*3/uL (ref 150–400)
RBC: 3.78 MIL/uL — ABNORMAL LOW (ref 4.22–5.81)
RBC: 3.59 MIL/uL — ABNORMAL LOW (ref 4.22–5.81)
RDW: 13.3 % (ref 11.5–15.5)
RDW: 13.1 % (ref 11.5–15.5)
WBC: 12.8 10*3/uL — ABNORMAL HIGH (ref 4.0–10.5)
WBC: 13.3 10*3/uL — ABNORMAL HIGH (ref 4.0–10.5)

## 2010-12-27 LAB — BASIC METABOLIC PANEL
BUN: 14 mg/dL (ref 6–23)
BUN: 16 mg/dL (ref 6–23)
BUN: 19 mg/dL (ref 6–23)
CO2: 24 mEq/L (ref 19–32)
CO2: 26 mEq/L (ref 19–32)
CO2: 25 mEq/L (ref 19–32)
Calcium: 8.4 mg/dL (ref 8.4–10.5)
Calcium: 8.2 mg/dL — ABNORMAL LOW (ref 8.4–10.5)
Calcium: 8.3 mg/dL — ABNORMAL LOW (ref 8.4–10.5)
Chloride: 108 mEq/L (ref 96–112)
Chloride: 105 mEq/L (ref 96–112)
Chloride: 102 mEq/L (ref 96–112)
Creatinine, Ser: 1.69 mg/dL — ABNORMAL HIGH (ref 0.4–1.5)
Creatinine, Ser: 1.81 mg/dL — ABNORMAL HIGH (ref 0.4–1.5)
Creatinine, Ser: 1.78 mg/dL — ABNORMAL HIGH (ref 0.4–1.5)
GFR calc Af Amer: 52 mL/min — ABNORMAL LOW (ref >60)
GFR calc Af Amer: 48 mL/min — ABNORMAL LOW (ref >60)
GFR calc Af Amer: 49 mL/min — ABNORMAL LOW (ref >60)
GFR calc non Af Amer: 43 mL/min — ABNORMAL LOW (ref >60)
GFR calc non Af Amer: 39 mL/min — ABNORMAL LOW (ref >60)
GFR calc non Af Amer: 40 mL/min — ABNORMAL LOW (ref >60)
Glucose, Bld: 144 mg/dL — ABNORMAL HIGH (ref 70–99)
Glucose, Bld: 131 mg/dL — ABNORMAL HIGH (ref 70–99)
Glucose, Bld: 110 mg/dL — ABNORMAL HIGH (ref 70–99)
Potassium: 3.6 mEq/L (ref 3.5–5.1)
Potassium: 4.3 mEq/L (ref 3.5–5.1)
Potassium: 4 mEq/L (ref 3.5–5.1)
Sodium: 139 mEq/L (ref 135–145)
Sodium: 137 mEq/L (ref 135–145)
Sodium: 133 mEq/L — ABNORMAL LOW (ref 135–145)

## 2011-01-05 NOTE — Op Note (Signed)
NAME:  [name], [name] NO.:  [id number]  MEDICAL RECORD NO.:  [id number]          PATIENT TYPE:  INP  LOCATION:  1403                         FACILITY:  [facility]  PHYSICIAN:  Bertram Millard. Parul Porcelli, M.D.DATE OF BIRTH:  23-Sep-1956  DATE OF PROCEDURE:  12/22/2010 DATE OF DISCHARGE:                              OPERATIVE REPORT   POSTOPERATIVE DIAGNOSIS:  A 6-cm right renal mass with hematuria.  POSTOPERATIVE DIAGNOSIS:  A 6-cm right renal mass with hematuria.  PRINCIPAL PROCEDURE:  Open right radical nephrectomy, intra-aortic caval lymph node sampling.  SURGEON:  Bertram Millard. Meeyah Ovitt, M.D.  FIRST ASSISTANT:  Delia Chimes, NP  SPECIMENS: 1. Right kidney. 2. Intra-aortic caval nodes.  ANESTHESIA:  General endotracheal.  ESTIMATED BLOOD LOSS:  1100 cc.  FLUID REPLACEMENT:  500 mL Hextend.  BRIEF HISTORY:  [name] is a 55 year old gentleman who recently presented to me with a several month history of intermittent gross hematuria.  Evaluation revealed the patient to have a right renal mass that was approximately 6 cm in size.  Additionally, he had some borderline intra-aortic caval nodes, proximally too, noted on staging CT of the abdomen and pelvis.  Chest x-ray and comprehensive metabolic panel were normal, i.e. normal alkaline phosphatase.  It was recommended that he undergo radical nephrectomy.  Because of the possibility of node sampling in this intra-aortic caval node area, it recommended that he undergo this open rather than laparoscopically.  Risks and complications of the procedure have been discussed with the patient and his the wife. They understand these and desire to proceed.  DESCRIPTION OF PROCEDURE:  The patient was identified and marked in the holding area.  He received preoperative IV antibiotics.  He was taken to the operating room where general endotracheal anesthetic was administered.  An orogastric tube was placed.  He was placed in  the supine position with a bump under the right flank.  His bladder was drained with a Foley catheter which was left indwelling.  His abdomen was prepped with DuraPrep and then draped.  Time-out was then performed.  A subcostal incision was performed on the right side and carried down into the peritoneal cavity with electrocautery.  Small bleeders were electrocoagulated.  Bookwalter retractor was placed.  Prior to placement of the retractor, inspection of the liver manually was normal.  No abnormalities were seen within the abdominal cavity on brief pressure examination on cursory examination.  The right colon was then mobilized and the duodenum kocherized.  The solitary renal vein was identified.  I could palpate an artery just inferior to this.  I then dissected the inferior most border of the kidney, and the gonadal vein was evident. It was doubly clipped proximally and distally and divided.  Dissecting lateral to the vena cava, I then sharply and bluntly dissected the inferior attachments of the kidney.  The ureter was identified and divided along with the rest of the inferior attachments.  Dissection was then carried superior along the medial border of the kidney.  The solitary renal artery was doubly clipped medially with Hem-o-Lok and singly clipped laterally and then divided.  After that the renal vein,  which was a bit short, was circumferentially dissected with a right angle.  Two silk ties were placed medially and one laterally, and the vein was then divided in between the lateral and 2 medial ties.  I then dissected lateral to the kidney which was easily freed up with electrocautery dissection.  There were no significant parasitic vessels at this point.  The superior limits of the kidney or superior attachments of the kidney to the retroperitoneum were dissected with electrocautery.  The adrenal was dissected medially, with one small adrenal vessel, clipped with Hem-o-Lok.   The adrenal was then taken with the specimen.  Very small amount of ooze was present in the adrenal bed. This was stopped with electrocautery.  I did place a small amount of FloSeal in this area as well.  Hemostasis was excellent.  At this point, the kidney was freed up and passed from the table.  During this part of dissection, when using a lap sponge, the 2 ties on the renal vein were pulled off.  The bleeding was controlled using sponge sticks and I oversewed the stump of the renal vein with a 2-0 Prolene.  We then turned our attention to the intra-aortic caval area inferior to the left renal vein, where it came off the cava.  I dissected the medial edge of the vena cava and small perforating vessels were clipped with metal clips.  The medial edge of the aorta was also dissected down to the area of the anterior spinous ligament.  There were multiple small lymphatics in this area, all were controlled with multiple Hem-o-Lok.  There was one noticeable node in this area and this was dissected free.  I took great caution to use Hem-o-Lok on all the dissected tissue.  The first bit of tissue was approximately 2 cm long by 1.5 cm wide.  It was mainly fat but I believe there was node in this area.  This was sent as a specimen.  I then continued my dissection somewhat inferiorly, again using Hem-o-Lok for small vessels.  The dissection was carried down towards the anterior spinous ligament.  This second bit of tissue was sent with the first tissue labeled intra-aortic caval nodes.  The dissected area was inspected for hemostasis which was excellent at this point.  The nephrectomy bed was also hemostatic.  At this point, the colon was replaced and the omentum was placed underneath the abdominal wall and extent of the incision.  Sponges were removed, and at this point preliminary sponge count was correct.  I then closed the subcostal wound anatomically in 2 separate fascial layers using 0 Prolene  placed in a simple running fashion.  I put the On-Q pump tubing through the trocars which had been placed on the lateral aspect of the wound just overlying the rectus fascial level.  Two separate pump tubes were placed.  These were fashioned to the skin with Steri-Strips and Tegaderm.  The skin edges were approximated using staples.  Dry sterile dressings were placed.  At this point, sponge, needle, instrument counts were correct x2.  The patient was awakened, extubated, and taken to PACU in stable condition.     Bertram Millard. Retta Diones, M.D.     SMD/MEDQ  D:  12/23/2010  T:  12/23/2010  Job:  [id number]  Electronically Signed by Marcine Matar M.D. on 01/05/2011 06:03:42 PM

## 2011-01-24 NOTE — Discharge Summary (Signed)
NAME:  [name] NO.:  [id number]  MEDICAL RECORD NO.:  [id number]          PATIENT TYPE:  INP  LOCATION:  1403                         FACILITY:  [facility]  PHYSICIAN:  Bertram Millard. Christop Hippert, M.D.DATE OF BIRTH:  1956-09-15  DATE OF ADMISSION:  12/22/2010 DATE OF DISCHARGE:  12/25/2010                              DISCHARGE SUMMARY   ADMISSION DIAGNOSES: 1. Six centimeter right renal mass. 2. Hematuria. 3. Periaortic lymphadenopathy.  DISCHARGE DIAGNOSES: 1. Six centimeter right renal mass. 2. Hematuria. 3. Periaortic lymphadenopathy.  PROCEDURES: 1. Open right radical nephrectomy. 2. Intra-aortic caval lymph node sampling.  HISTORY AND PHYSICAL:  For full details, please see Admission History and Physical.  Briefly, [name] is a 55 year old gentleman who recently presented to Dr. Retta Diones with a several month history of intermittent gross hematuria.  Evaluation revealed the patient to have a right renal mass that was approximately 6 cm in size.  Additionally, he had some borderline intra-aortic caval nodes, approximately two, noted on staging CT of the abdomen and the pelvis.  Chest x-ray and comprehensive metabolic panel were normal, i.e., normal alkaline phosphatase.  It was recommended that he undergo a radical nephrectomy. Because of the possibility of node sampling in the intra-aortic caval node area, it was recommended that he undergo this open rather than laparoscopically.  Therefore, after consideration regarding these management options for treatment, he did elect to proceed with surgical therapy and open radical nephrectomy along with an intra-aortic caval node dissection.  HOSPITAL COURSE:  On December 22, 2010, he was taken to the operating room where he underwent the above-named procedures in which he tolerated well and without complications.  Postoperatively, he was able to be transferred to a regular hospital room following  recovery from anesthesia.  He was found to be hemodynamically stable as noted by a postoperative hemoglobin of 10.7.  He was also found to be hemodynamically stable on postoperative day #1 as noted by a hemoglobin of 10.1.  His serum creatinine was found to be slightly elevated at 1.69 postoperatively and continued to increase to 1.81 on postoperative day #1.  It did subsequently start to decline on postoperative day #2 as noted at 1.78.  Postoperatively, he was able to begin a clear liquid diet that evening.  He was able to be transitioned to a regular diet over the course of the next 24-48 hours with some nausea, but no vomiting.  He was able to begin ambulation on postoperative day #1 after being maintained on strict bedrest for 24 hours.  On postoperative day #2, he was able to be transitioned to oral pain medications.  By postoperative day #3, he was ambulating without difficulty, his pain was well managed and he was tolerating a regular diet without further complaints of nausea.  Therefore, he was felt to be stable for discharge home as he had met all discharge criteria.  DISPOSITION:  Home.  DISCHARGE INSTRUCTIONS:  He was instructed to be ambulatory, but specifically told to refrain from any heavy lifting, strenuous activity or driving.  He was instructed to resume a regular diet.  DISCHARGE MEDICATIONS:  He was instructed to  resume all home medications consisting of Crestor and Trilipix.  In addition, he was told to hold his herbal supplements consisting of glucosamine and fish oil as well as his aspirin for 10 days.  He was to restart those on January 30.  He was provided a prescription for Percocet to take as needed for pain and told to use Colace as a stool softener.  FOLLOWUP:  He will follow up in 10-14 days for further postoperative evaluation as well as removal of skin staples.  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  Fuhrman nuclear grade 2 of 5 clear cell type pT1b, pN0.     Delia Chimes, NP   ______________________________ Bertram Millard. Wil Slape, M.D.    MA/MEDQ  D:  01/04/2011  T:  01/04/2011  Job:  [id number]  Electronically Signed by Delia Chimes NP on 01/23/2011 08:17:58 PM Electronically Signed by Marcine Matar M.D. on 01/24/2011 10:46:10 AM

## 2011-01-24 NOTE — H&P (Signed)
NAME:  [name], [name] NO.:  [id number]  MEDICAL RECORD NO.:  [id number]           PATIENT TYPE:  LOCATION:                                 FACILITY:  PHYSICIAN:  Bertram Millard. Chadwick Reiswig, M.D.DATE OF BIRTH:  08/13/1956  DATE OF ADMISSION: DATE OF DISCHARGE:                             HISTORY & PHYSICAL   HISTORY OF PRESENT ILLNESS:  This is a 55 year old gentleman who presented to Dr. Marcine Matar with complaints of intermittent gross hematuria.  He first noticed this hematuria in October 2011.  That was only one episode.  He had another episode or 2 about a month later and then again in late December 2011 noticed several episodes.  He has some vague right chest and shoulder discomfort associated with this.  He denies any significant back or abdominal pain and has had no specific colic.  He denies dysuria, significant frequency, urgency, or pressure to urinate.  He has some vague testicular discomfort.  He does have a history of passing a right-sided ureteral stone in 2005 and was scheduled for lithotripsy but the stone was not evident on his preoperative KUB.  He has had no recurring episodes of renal calculi or ureteral calculus since that time.  A noncontrasted abdominal CT scan was obtained, which revealed a possible mass within the right kidney.  A contrasted study followed which revealed a 6 cm right renal mass.  There were some borderline enlarged nodes in his intraaortic caval area.  A cystoscopy was performed to evaluate for his recent complaints of hematuria. Visualization of the bladder was clear as well as the ureteral orifices. A systematic survey of the bladder demonstrated no bladder tumors or stones.  PAST MEDICAL HISTORY: 1. Arthritis. 2. Esophageal reflux. 3. Nephrolithiasis.  PAST SURGICAL HISTORY: 1. Distal reinsertion of ruptured biceps tendon on the left. 2. Left anterior cruciate ligament repair.  MEDICATIONS: 1.  Aspirin. 2. Crestor. 3. Fish oil. 4. Trilipix.  ALLERGIES:  There are no known drug allergies.  FAMILY HISTORY:  There is a family history of prostate cancer on his paternal side.  SOCIAL HISTORY:  He is married with one son and one daughter.  His wife is an Producer, television/film/video at [employer].  He works as a Emergency planning/management officer.  He uses alcohol occasionally as well as drinks caffeinated beverages.  He denies any use of tobacco.  REVIEW OF SYSTEMS:  All systems were reviewed and negative except for positive complaints of urinary frequency and hematuria as well as abdominal pain, back pain, and occasional testicular pain.  PHYSICAL EXAMINATION:  VITAL SIGNS:  Temperature 97.9, pulse 60, respiration 18, blood pressure 146/83. CONSTITUTIONAL:  He is a well-developed, well-nourished gentleman in no acute distress. HEENT:  Atraumatic, normocephalic.  Oropharynx is clear. CARDIOVASCULAR:  Regular rate and rhythm with no peripheral edema. PULMONARY:  There is no respiratory distress and normal respiratory rhythm and effort. ABDOMEN:  Rounded, soft, and nontender.  No masses are palpated.  No CVA tenderness.  No hernias are palpable.  No hepatosplenomegaly. RECTAL:  Rectal exam demonstrates normal sphincter tone.  Anus is normal on inspection.  No  tenderness and no masses.  Estimated prostate size is 1+.  Normal rectal tone.  No rectal masses.  Prostate is smooth, symmetric, and nontender.  Prostate has no nodularity and is nontender. The left seminal vesicle is not palpable.  The right seminal vesicle is also nonpalpable.  The perineum is normal on inspection. GENITOURINARY:  Examination of the penis demonstrates no discharge, masses, or lesions and a normal meatus.  Scrotum is normal in appearance without lesions.  The right and left epididymis is palpably normal and nontender. LYMPHATICS:  The anterior cervical, supraclavicular, femoral and inguinal nodes are not  enlarged or tender. SKIN:  Normal skin turgor.  No visible rash and no visible skin lesions. NEUROPSYCHIATRIC:  Mood and affect are appropriate.  Remote and recent memory are intact.  RADIOLOGY: 1. CT of abdomen and pelvis shows heterogeneous enhancing solid mass     involving the mid pole region of the right kidney which is     suspicious for renal cell carcinoma. 2. Small periaortic lymph nodes.  PROCEDURE:  Cystoscopy, urethral meatus without abnormalities. Anteriorly urethra is without abnormality.  Prostatic urethra is without abnormalities.  Bladder visualization is clear.  The ureteral orifices are in normal anatomic position with clear efflux of urine.  Systematic survey of the bladder demonstrates no bladder tumors or stones.  Mucosa is smooth without abnormalities.  LABORATORY DATA:  BUN is 19, creatinine 1.30.  Sodium 143, potassium 4.2, chloride 107, CO2 of 25, calcium 10, protein 6.9, albumin 4.7, AST 22, ALT 25, alkaline phosphatase is 33.  ASSESSMENT: 1. Gross hematuria. 2. Probable right renal cell carcinoma. 3. Nephrolithiasis. 4. Enlarged intra-aortic caval nodes.  There is a 6 cm right renal     cell mass consistent with renal cell carcinoma.  He has also     presented with gross hematuria.  Gross hematuria was evaluated per     cystoscopy and found to be negative.  After evaluating the patient     and thoroughly discussing the findings with the patient and his     wife, Dr. Retta Diones conveyed to them the need for surgical removal     of this kidney.  The risks and complications of nephrectomy were     discussed with the patient including but not limited to DVT,     pulmonary embolus,     pulmonary and wound infection, injury to surrounding organs, blood     loss, need for transfusion.  After their verbalization of     understanding, they elected to proceed with surgical therapy.     Therefore, his surgical procedure was scheduled for December 22, 2010.     Delia Chimes, NP   ______________________________ Bertram Millard. Nitara Szczerba, M.D.    MA/MEDQ  D:  01/04/2011  T:  01/04/2011  Job:  [id number]  Electronically Signed by Delia Chimes NP on 01/07/2011 10:04:15 AM Electronically Signed by Marcine Matar M.D. on 01/24/2011 10:45:55 AM

## 2011-04-22 NOTE — Op Note (Signed)
NAME:  [name], [name]             ACCOUNT NO.:  [id number]   MEDICAL RECORD NO.:  [id number]          PATIENT TYPE:  AMB   LOCATION:  ENDO                         FACILITY:  [facility]   PHYSICIAN:  Petra Kuba, M.D.    DATE OF BIRTH:  August 14, 1956   DATE OF PROCEDURE:  01/07/2005  DATE OF DISCHARGE:                                 OPERATIVE REPORT   PROCEDURE:  EGD with biopsy.   INDICATIONS FOR PROCEDURE:  Esophageal ulcer, hiatal hernia. Evaluate  endoscopically.  Consent was signed after risks, benefits, methods, and  options were thoroughly discussed in the office.   MEDICATIONS:  Demerol 100, Versed 8.   DESCRIPTION OF PROCEDURE:  The video endoscope was inserted by direct  vision.  Esophagus was normal.  There were no signs of any residual ulcer.  He did have a small hiatal hernia and a thin widely patent ring.  The scope  passed easily into the stomach and advanced to the antrum pertinent for some  mild to moderate antritis, advanced through a normal pyloris into a normal  duodenal bulb and around the C-loop to a normal second portion of the  duodenum.  The scope was withdrawn back to the bulb and a good look there  ruled out abnormalities in that location.  The scope was withdrawn back to  the stomach and retroflexed.  High in the cardia, the hiatal hernia was  confirmed.  The angularis, lesser and greater curve, and fundus were all  evaluated on retroflexed visualization without additional findings.  Straight visualization in the stomach confirmed the antritis and very  minimal gastritis, but no other lesions were seen.  A few biopsies of the  antrum and a few of the proximal stomach were obtained to rule out  Helicobacter.  Air was suctioned and the scope slowly withdrawn.  Again a  good look at the esophagus was normal without signs of Barrett's or residual  ulcer.  The scope was removed.  The patient tolerated the procedure well.  There was no obvious immediate  complications.   ENDOSCOPIC ASSESSMENT:  1.  Small hiatal hernia with a thin, widely patent ring.  2.  Healed esophageal ulcer.  3.  Antritis greater than gastritis, status post biopsy.  4.  Otherwise normal EGD.   PLAN:  Await pathology.  Treat Helicobacter probably if positive since he  will be on nonsteroidals for a while.  Continue pump inhibitors indefinitely  unless we elect to do hiatal hernia surgery or more aggressive weight loss,  etc.  I will be happy to see back p.r.n. or in three months just to check  symptoms.     MEM/MEDQ  D:  01/07/2005  T:  01/07/2005  Job:  [id number]   cc:   Warrick Parisian, M.D.  [place]

## 2011-04-22 NOTE — Op Note (Signed)
[place]  Patient:    [name], [name]                    MRN: [id number] Proc. Date: 12/29/00 Adm. Date:  [id number] Attending:  Twana First                           Operative Report  PREOPERATIVE DIAGNOSES: 1. Left knee loose body. 2. Left knee post-traumatic degenerative joint disease.  POSTOPERATIVE DIAGNOSES: 1. Left knee loose body. 2. Left knee medial and lateral meniscal tears. 3. Left knee post-traumatic degenerative joint disease.  PROCEDURE: 1. Left knee EUA followed by arthroscopic excision of loose body. 2. Left knee partial medial and lateral meniscectomies. 3. Left knee chondroplasty.  SURGEON:  Elana Alm. Thurston Hole, M.D.  ASSISTANT:  Kirstin A. Shepperson, P.A.  ANESTHESIA:  General.  OPERATIVE TIME:  40 minutes.  COMPLICATIONS:  None.  INDICATIONS FOR PROCEDURE:  [name] is a 55 year old who has sustained the acute onset of locking and catching in his left knee over the past two weeks with signs, symptoms, and x-ray documenting a large bony loose body in the anterior compartment of the knee, and he is now to undergo arthroscopy.  DESCRIPTION OF PROCEDURE:  [name] was brought to the operating room on December 29, 2000, placed on the operative table in the supine position.  After an adequate level of general anesthesia was obtained, his left knee was examined under anesthesia, range of motion 0 to 125 degrees, 1 to 2+ griptation, knee stable to ligamentous exam.  The portal sites were sterilely injected with 0.25% Marcaine with epinephrine.  The left leg was then prepped using sterile Betadine and draped using sterile technique.  Initially, through an anterolateral portal, the arthroscope with the pump attached was placed into an anteromedial portal, and arthroscopic probe was placed.  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  He had grade 4 changes over 20% of his medial tibial plateau, and grade 3 changes over another 50% of his medial tibial plateau which was debrided.  His medial meniscus was probed, and he had a partial tear of the posterior and medial horn, of which 25 to 30% was resected back to a stable rim.  The intercondylar notch inspected.  There was a large loose body in the anterior notch, and this was removed.  It measured approximately 1 x 1 cm.  Underneath this, the anterior cruciate ligament graft was intact.  There was 3 to 4 mm of anterior laxity, but there was a good endpoint.  The posterior cruciate was intact and stable.  The lateral compartment inspected.  Articular cartilage, the lateral femoral condyle, and tibial plateau showed mild grade 1 and 2 chondromalacia. Lateral meniscus showed tear of the anterior horn, 25 to 30% which was resected.  The rest of the latera meniscus was intact.  Patellofemoral joint showed grade 3 chondromalacia over 50% of the femoral groove which was debrided, as well as the patella.  The patella tracked normally.  Moderate synovitis was debrided in the medial and lateral gutters.  Otherwise, they were free of pathology.  After this was done, it was felt that all pathology had been satisfactorily addressed.  The instruments were removed.  Portals closed with 3-0 nylon suture and injected with 0.25% Marcaine with epinephrine and 5  mg of morphine.  Sterile dressing was applied. The patient awakened and taken to recovery room in stable condition.  FOLLOWUP CARE:  [name] will be followed as an outpatient on Vicodin and Naprosyn.  See me in the office in a week for sutures out and followup. DD:  12/29/00 TD:  12/29/00 Job: [id number]

## 2011-04-22 NOTE — Op Note (Signed)
. [facility]  Patient:    [name], [name] Visit Number: [id number] MRN: [id number]          Service Type: DSU Location: [facility] Attending Physician:  Twana First Dictated by:   Elana Alm Thurston Hole, M.D. Proc. Date: 06/21/02 Admit Date:  06/21/2002 Discharge Date: 06/21/2002                             Operative Report  PREOPERATIVE DIAGNOSIS:  Left distal biceps tendon rupture.  POSTOPERATIVE DIAGNOSIS:  Left distal biceps tendon rupture.  PROCEDURE:  Left distal biceps tendon repair.  SURGEON:  Elana Alm. Thurston Hole, M.D.  ASSISTANT:  Julien Girt, P.A.  ANESTHESIA:  General.  OPERATIVE TIME:  One hour and 30 minutes.  COMPLICATIONS:  None.  INDICATION FOR PROCEDURE:  The patient is a 55 year old gentleman who sustained a left distal biceps tendon rupture approximately five days ago, significant disability secondary to this and is now to undergo primary biceps tendon repair.  DESCRIPTION OF PROCEDURE:  The patient was brought to the operating room on June 21, 2002 and placed on the operating table in supine position.  After an adequate level of general anesthesia was obtained, his left arm was prepped using sterile DuraPrep and draped using sterile technique.  He received Ancef 1 g IV preoperatively for prophylaxis.  The arm was exsanguinated and a tourniquet elevated to 300 mm.  Initially through a 10-cm S-shaped incision in the antecubital fossa, initial exposure was made.  Underlying subcutaneous tissues were incised in line with the skin incision.  The biceps tendon rupture was easily found under the antebrachial fascia and the end of the tendon was brought out into the wound and it was debrided back to healthy tissue and then a #2 Fibrewire whipstitch was used to place in this tendon to be used at the end of the case to secure the tendon.  After this was done, a careful dissection was then brought down to the region of the  radial biciptal tuberosity on the proximal radius, carefully dissecting and protecting the median nerve and the radial artery and the antecubital artery as well, recurrent radial artery also carefully protected as well.  After this exposure was made, then a 7.0-mm drill was used to drill a hole in the bicipital tuberosity on the radial head.  After this was done, then the Arthrex biceps tenodesis screw and set were used and the biceps tendon was passed down into its tunnel and then into the hole in the biciptal tuberosity and then the interference screw -- a 7.0 x 15.0-mm interference -- was then screwed into position as an interference screw, very tightly held in the tunnel; this secured the biceps tendon in place, back into its anatomic position.  After this was done, the wound was copiously irrigated.  The tourniquet was released, small venous bleeders were cauterized, good radial pulse was noted, the wound then closed with interrupted 2-0 Vicryl suture and skin staples. Sterile dressings were applied and a long arm splint was applied with the elbow at 90 degrees and supinated, the patient then awakened and taken to recovery room in stable condition.  Needle and sponge counts were correct x2 at the end of the case.  FOLLOWUP CARE:  The patient will be followed overnight in the recovery care center for IV pain control and neurovascular monitoring.  He will be discharged tomorrow on Percocet and Naprosyn.  I will see  him back in the office in a week for sutures out and followup. Dictated by:   Elana Alm Thurston Hole, M.D. Attending Physician:  Twana First DD:  06/21/02 TD:  06/26/02 Job: [id number]

## 2011-07-05 ENCOUNTER — Ambulatory Visit (HOSPITAL_COMMUNITY)
Admission: RE | Admit: 2011-07-05 | Discharge: 2011-07-05 | Disposition: A | Discharge status: Home / Self Care | Payer: Managed Care, Other (non HMO) | Source: Ambulatory Visit | Attending: Urology | Admitting: Urology

## 2011-07-05 ENCOUNTER — Other Ambulatory Visit: Payer: Self-pay | Admitting: Urology

## 2011-07-05 DIAGNOSIS — I10 Essential (primary) hypertension: Secondary | ICD-10-CM | POA: Insufficient documentation

## 2011-07-05 DIAGNOSIS — C649 Malignant neoplasm of unspecified kidney, except renal pelvis: Secondary | ICD-10-CM | POA: Insufficient documentation

## 2011-11-02 ENCOUNTER — Emergency Department (HOSPITAL_COMMUNITY): Payer: Managed Care, Other (non HMO)

## 2011-11-02 ENCOUNTER — Encounter: Payer: Self-pay | Admitting: *Deleted

## 2011-11-02 ENCOUNTER — Inpatient Hospital Stay (HOSPITAL_COMMUNITY)
Admission: EM | Admit: 2011-11-02 | Discharge: 2011-11-05 | DRG: 069 | Disposition: A | Discharge status: Home / Self Care | Payer: Managed Care, Other (non HMO) | Attending: Neurology | Admitting: Neurology

## 2011-11-02 DIAGNOSIS — E781 Pure hyperglyceridemia: Secondary | ICD-10-CM | POA: Diagnosis present

## 2011-11-02 DIAGNOSIS — E785 Hyperlipidemia, unspecified: Secondary | ICD-10-CM | POA: Diagnosis present

## 2011-11-02 DIAGNOSIS — Z7902 Long term (current) use of antithrombotics/antiplatelets: Secondary | ICD-10-CM

## 2011-11-02 DIAGNOSIS — G459 Transient cerebral ischemic attack, unspecified: Principal | ICD-10-CM | POA: Diagnosis present

## 2011-11-02 DIAGNOSIS — Z85528 Personal history of other malignant neoplasm of kidney: Secondary | ICD-10-CM

## 2011-11-02 DIAGNOSIS — N183 Chronic kidney disease, stage 3 unspecified: Secondary | ICD-10-CM | POA: Diagnosis present

## 2011-11-02 DIAGNOSIS — E669 Obesity, unspecified: Secondary | ICD-10-CM | POA: Diagnosis present

## 2011-11-02 DIAGNOSIS — R209 Unspecified disturbances of skin sensation: Secondary | ICD-10-CM | POA: Diagnosis not present

## 2011-11-02 DIAGNOSIS — I129 Hypertensive chronic kidney disease with stage 1 through stage 4 chronic kidney disease, or unspecified chronic kidney disease: Secondary | ICD-10-CM | POA: Diagnosis present

## 2011-11-02 DIAGNOSIS — I672 Cerebral atherosclerosis: Secondary | ICD-10-CM | POA: Diagnosis present

## 2011-11-02 DIAGNOSIS — I6609 Occlusion and stenosis of unspecified middle cerebral artery: Secondary | ICD-10-CM | POA: Diagnosis present

## 2011-11-02 DIAGNOSIS — Z7982 Long term (current) use of aspirin: Secondary | ICD-10-CM

## 2011-11-02 DIAGNOSIS — G40209 Localization-related (focal) (partial) symptomatic epilepsy and epileptic syndromes with complex partial seizures, not intractable, without status epilepticus: Secondary | ICD-10-CM | POA: Diagnosis not present

## 2011-11-02 DIAGNOSIS — G40109 Localization-related (focal) (partial) symptomatic epilepsy and epileptic syndromes with simple partial seizures, not intractable, without status epilepticus: Secondary | ICD-10-CM | POA: Diagnosis present

## 2011-11-02 HISTORY — DX: Unspecified osteoarthritis, unspecified site: M19.90

## 2011-11-02 HISTORY — DX: Essential (primary) hypertension: I10

## 2011-11-02 HISTORY — DX: Pure hypercholesterolemia, unspecified: E78.00

## 2011-11-02 HISTORY — DX: Cerebral infarction, unspecified: I63.9

## 2011-11-02 HISTORY — DX: Sleep apnea, unspecified: G47.30

## 2011-11-02 LAB — CBC
HCT: 38.4 % — ABNORMAL LOW (ref 39.0–52.0)
Hemoglobin: 13.3 g/dL (ref 13.0–17.0)
MCH: 29.4 pg (ref 26.0–34.0)
MCHC: 34.6 g/dL (ref 30.0–36.0)
MCV: 84.8 fL (ref 78.0–100.0)
Platelets: 234 10*3/uL (ref 150–400)
RBC: 4.53 MIL/uL (ref 4.22–5.81)
RDW: 13.7 % (ref 11.5–15.5)
WBC: 8.8 10*3/uL (ref 4.0–10.5)

## 2011-11-02 LAB — BASIC METABOLIC PANEL
BUN: 18 mg/dL (ref 6–23)
CO2: 25 mEq/L (ref 19–32)
Calcium: 9.6 mg/dL (ref 8.4–10.5)
Chloride: 104 mEq/L (ref 96–112)
Creatinine, Ser: 1.42 mg/dL — ABNORMAL HIGH (ref 0.50–1.35)
GFR calc Af Amer: 63 mL/min — ABNORMAL LOW (ref >90)
GFR calc non Af Amer: 55 mL/min — ABNORMAL LOW (ref >90)
Glucose, Bld: 100 mg/dL — ABNORMAL HIGH (ref 70–99)
Potassium: 4.1 mEq/L (ref 3.5–5.1)
Sodium: 140 mEq/L (ref 135–145)

## 2011-11-02 LAB — URINALYSIS, ROUTINE W REFLEX MICROSCOPIC
Bilirubin Urine: NEGATIVE
Glucose, UA: NEGATIVE mg/dL
Hgb urine dipstick: NEGATIVE
Ketones, ur: NEGATIVE mg/dL
Leukocytes, UA: NEGATIVE
Nitrite: NEGATIVE
Protein, ur: NEGATIVE mg/dL
Specific Gravity, Urine: 1.018 (ref 1.005–1.030)
Urobilinogen, UA: 0.2 mg/dL (ref 0.0–1.0)
pH: 6 (ref 5.0–8.0)

## 2011-11-02 MED ORDER — ACETAMINOPHEN 325 MG PO TABS: 650.0000 mg | ORAL_TABLET | ORAL | Status: DC | PRN

## 2011-11-02 MED ORDER — ASPIRIN 325 MG PO TABS
325.0000 mg | ORAL_TABLET | Freq: Every day | ORAL | Status: DC
  Administered 2011-11-03 – 2011-11-04 (×2): 325 mg via ORAL
  Filled 2011-11-02 (×2): qty 1

## 2011-11-02 NOTE — ED Provider Notes (Signed)
History     CSN: [id number] Arrival date & time: 11/02/2011  8:20 PM   First MD Initiated Contact with Patient 11/02/11 2053      Chief Complaint  Patient presents with  . Weakness    (Consider location/radiation/quality/duration/timing/severity/associated sxs/prior treatment) HPI History provided by pt and his wife.  Pt developed numbness and tingling of RUE 6 days ago.  Was unable to hold his razor.  Sx gradually improved but have been intermittent ever since.  Mild paresthesias of RLE as well.  He has also had intermittent difficulty w/ expressing himself.  Unable to formulate sentences.  Today he could not understand what his wife was asking him and she had to repeat herself several times.  Had a mild headache 2-3 days ago and has had mild loss of balance.  Denies blurred vision, slurred speech, dizziness, dysphagia, extremity weakness.  No FH CVA.  Denies head trauma.  No recent illnesses.   Past Medical History  Diagnosis Date  . Cancer   . Hypertension   . Sleep apnea   . High cholesterol   . Arthritis     Past Surgical History  Procedure Date  . Nephrectomy   . Carpal tunnel release     History reviewed. No pertinent family history.  History  Substance Use Topics  . Smoking status: Not on file  . Smokeless tobacco: Not on file  . Alcohol Use: Yes     socially      Review of Systems  All other systems reviewed and are negative.    Allergies  Review of patient's allergies indicates no known allergies.  Home Medications   Current Outpatient Rx  Name Route Sig Dispense Refill  . AMLODIPINE-OLMESARTAN 5-40 MG PO TABS Oral Take 1 tablet by mouth daily.      . ASPIRIN EC 81 MG PO TBEC Oral Take 81 mg by mouth daily.      . COLESEVELAM HCL 3.75 G PO PACK Oral Take 3.75 g by mouth every morning.      Marland Kitchen OMEGA-3 FATTY ACIDS 1000 MG PO CAPS Oral Take 1 g by mouth daily.      Marland Kitchen PYRIDOXINE HCL 200 MG PO TABS Oral Take 200 mg by mouth daily.        BP 148/91   Pulse 63  Temp(Src) 98.1 F (36.7 C) (Oral)  Resp 16  Ht 5\' 10"  (1.778 m)  Wt 230 lb (104.327 kg)  BMI 33.00 kg/m2  SpO2 98%  Physical Exam  Nursing note and vitals reviewed. Constitutional: He is oriented to person, place, and time. He appears well-developed and well-nourished. No distress.  HENT:  Head: Normocephalic and atraumatic.  Eyes:       Normal appearance  Neck: Normal range of motion.  Cardiovascular: Normal rate, regular rhythm and intact distal pulses.   Pulmonary/Chest: Effort normal and breath sounds normal.  Musculoskeletal: Normal range of motion.  Neurological: He is alert and oriented to person, place, and time. He has normal reflexes. He displays no tremor. No cranial nerve deficit or sensory deficit. He displays a negative Romberg sign. Coordination and gait normal.       5/5 and equal upper and lower extremity strength.  No past pointing.  No pronator drift.  No romberg sign.  Nml gait.   Skin: Skin is warm and dry. No rash noted.  Psychiatric: He has a normal mood and affect. His behavior is normal.    ED Course  Procedures (including critical care time)  Date: 11/03/2011  Rate: 52  Rhythm: sinus bradycardia  QRS Axis: normal  Intervals: PR prolonged  ST/T Wave abnormalities: nonspecific T wave changes  Conduction Disutrbances:none  Narrative Interpretation:   Old EKG Reviewed: changes noted  (T wave changes lateral leads)    Labs Reviewed  CBC - Abnormal; Notable for the following:    HCT 38.4 (*)    All other components within normal limits  BASIC METABOLIC PANEL - Abnormal; Notable for the following:    Glucose, Bld 100 (*)    Creatinine, Ser 1.42 (*)    GFR calc non Af Amer 55 (*)    GFR calc Af Amer 63 (*)    All other components within normal limits  URINALYSIS, ROUTINE W REFLEX MICROSCOPIC  GLUCOSE, POCT (MANUAL RESULT ENTRY)  GLUCOSE, POCT (MANUAL RESULT ENTRY)  PROTIME-INR  APTT  HEMOGLOBIN A1C  CARDIAC PANEL(CRET  KIN+CKTOT+MB+TROPI)  LIPID PANEL  URINALYSIS   Ct Head Wo Contrast  11/02/2011  *RADIOLOGY REPORT*  Clinical Data: Weakness and right arm tingling; confusion. Hypertension.  CT HEAD WITHOUT CONTRAST  Technique:  Contiguous axial images were obtained from the base of the skull through the vertex without contrast.  Comparison: None.  Findings: There is no evidence of acute infarction, mass lesion, or intra- or extra-axial hemorrhage on CT.  The posterior fossa, including the cerebellum, brainstem and fourth ventricle, is within normal limits.  The third and lateral ventricles, and basal ganglia are unremarkable in appearance.  The cerebral hemispheres are symmetric in appearance, with normal gray- white differentiation.  No mass effect or midline shift is seen.  There is no evidence of fracture; visualized osseous structures are unremarkable in appearance.  The visualized portions of the orbits are within normal limits.  The paranasal sinuses and mastoid air cells are well-aerated.  No significant soft tissue abnormalities are seen.  IMPRESSION: Unremarkable noncontrast CT of the head.  Original Report Authenticated By: Tonia Ghent, M.D.     1. Transient ischemic attack       MDM  Pt presents w/ intermittent right-sided paresthesias as well as "difficulty expressing himself" x 6 days.  RF for stroke include HTN and hyperlipidemia.  No focal neuro deficits on exam.  CT head neg and labs unremarkable.  Pt placed on TIA protocol and moved to the CDU.          Otilio Miu, PA 11/02/11 2355  Otilio Miu, PA 11/03/11 [phone]

## 2011-11-02 NOTE — ED Notes (Signed)
Patient currently sitting up in bed; no respiratory or acute distress noted.  Wife present at bedside.  Patient has no questions or concerns at this time.  Updated patient on plan of care; informed patient that we are waiting for CT to come and transport patient.  Will continue to monitor.

## 2011-11-02 NOTE — ED Notes (Signed)
Patient currently sitting up in bed; no respiratory or acute distress noted.  Wife present at bedside.  Updated patient on plan of care; informed patient that we are waiting on x-ray tech to transport to CT.  Patient has no other questions or concerns at this time.  Will continue to monitor.

## 2011-11-02 NOTE — ED Notes (Signed)
Patient's wife noticed that patient was acting differently on Thanksgiving (different affect, could not answer questions, had to continuously repeat things to patient, etc)..  Patient's wife states that this started on Thanksgiving morning (these symptoms have been intermittent since then).  Patient states that when he woke up Thanksgiving morning, he reported right arm numbness/tingling, could not hold on to things (such as razor), etc.  Patient states that he was able to get ready for work this morning, but that his right hand "would not work very good".  Patient felt confused throughout the day.  On Friday, patient states that he "could not get his thoughts processed"; states that these symptoms improved the next day.  Around 4 pm today, patient states that he started to get a "funny, tingling" feeling in his right arm and leg again.  States that he cannot articulate what he wants to say one minute, and then can be clear/can talk the next.  Patient currently alert and oriented x4; PERRL present.  Wife present at bedside.  Will continue to monitor.

## 2011-11-02 NOTE — ED Notes (Signed)
Pt woke up on Thursday at 4am with numbness in right side.  Pt continues to have numbness and weakness in his right arm (and mildly in right leg) since.  These symptoms have been intermittent.  Pt has also been having trouble "formulating thoughts" and his wife notes that his speech is "not normal".  Pt has been working his regular hours but feels that the symptoms have been worse today.  Pt has been having ha with this.  Pt does not have any facial droop, slurred speech, no weakness noted on exam, no arm drift.  Pt is ambulatory

## 2011-11-02 NOTE — ED Notes (Signed)
Called CT about delay; CT states that they will send someone to transport patient shortly.

## 2011-11-03 ENCOUNTER — Encounter (HOSPITAL_COMMUNITY): Payer: Self-pay | Admitting: Anesthesiology

## 2011-11-03 ENCOUNTER — Other Ambulatory Visit: Payer: Self-pay

## 2011-11-03 ENCOUNTER — Emergency Department (HOSPITAL_COMMUNITY): Payer: Managed Care, Other (non HMO)

## 2011-11-03 ENCOUNTER — Encounter (HOSPITAL_COMMUNITY): Payer: Self-pay | Admitting: Physician Assistant

## 2011-11-03 DIAGNOSIS — I517 Cardiomegaly: Secondary | ICD-10-CM

## 2011-11-03 DIAGNOSIS — G459 Transient cerebral ischemic attack, unspecified: Secondary | ICD-10-CM | POA: Diagnosis present

## 2011-11-03 DIAGNOSIS — I639 Cerebral infarction, unspecified: Secondary | ICD-10-CM

## 2011-11-03 DIAGNOSIS — I6609 Occlusion and stenosis of unspecified middle cerebral artery: Secondary | ICD-10-CM | POA: Diagnosis present

## 2011-11-03 HISTORY — DX: Cerebral infarction, unspecified: I63.9

## 2011-11-03 LAB — COMPREHENSIVE METABOLIC PANEL
Albumin: 3.8 g/dL (ref 3.5–5.2)
BUN: 19 mg/dL (ref 6–23)
Calcium: 9.3 mg/dL (ref 8.4–10.5)
Chloride: 105 mEq/L (ref 96–112)
Creatinine, Ser: 1.43 mg/dL — ABNORMAL HIGH (ref 0.50–1.35)
GFR calc non Af Amer: 54 mL/min — ABNORMAL LOW (ref >90)
Total Bilirubin: 0.3 mg/dL (ref 0.3–1.2)

## 2011-11-03 LAB — PROTIME-INR
INR: 1.1 (ref 0.00–1.49)
Prothrombin Time: 14.4 seconds (ref 11.6–15.2)

## 2011-11-03 LAB — HEMOGLOBIN A1C
Hgb A1c MFr Bld: 5.3 % (ref <5.7)
Mean Plasma Glucose: 105 mg/dL (ref <117)

## 2011-11-03 LAB — LIPID PANEL
Cholesterol: 216 mg/dL — ABNORMAL HIGH (ref 0–200)
HDL: 20 mg/dL — ABNORMAL LOW (ref >39)
Triglycerides: 712 mg/dL — ABNORMAL HIGH (ref <150)
VLDL: UNDETERMINED mg/dL (ref 0–40)

## 2011-11-03 LAB — HEPARIN LEVEL (UNFRACTIONATED): Heparin Unfractionated: <0.1 IU/mL — ABNORMAL LOW (ref 0.30–0.70)

## 2011-11-03 LAB — CARDIAC PANEL(CRET KIN+CKTOT+MB+TROPI): Relative Index: 1.9 (ref 0.0–2.5)

## 2011-11-03 MED ORDER — HEPARIN SOD (PORCINE) IN D5W 100 UNIT/ML IV SOLN
1600.0000 [IU]/h | INTRAVENOUS | Status: DC
  Administered 2011-11-03 (×2): 1200 [IU]/h via INTRAVENOUS
  Filled 2011-11-03 (×2): qty 250

## 2011-11-03 MED ORDER — MIDAZOLAM HCL 5 MG/5ML IJ SOLN
INTRAMUSCULAR | Status: DC | PRN
  Administered 2011-11-03 (×2): 1 mg via INTRAVENOUS

## 2011-11-03 MED ORDER — ONDANSETRON HCL 4 MG/2ML IJ SOLN: 4.0000 mg | Freq: Four times a day (QID) | INTRAMUSCULAR | Status: DC | PRN

## 2011-11-03 MED ORDER — HEPARIN SOD (PORCINE) IN D5W 100 UNIT/ML IV SOLN: 1200.0000 [IU]/h | INTRAVENOUS | Status: DC

## 2011-11-03 MED ORDER — HEPARIN SOD (PORK) LOCK FLUSH 100 UNIT/ML IV SOLN: 500.0000 [IU] | Freq: Once | INTRAVENOUS | Status: DC

## 2011-11-03 MED ORDER — SODIUM CHLORIDE 0.9 % IV SOLN
INTRAVENOUS | Status: AC
  Administered 2011-11-03: 20:00:00 via INTRAVENOUS

## 2011-11-03 MED ORDER — FENTANYL CITRATE 0.05 MG/ML IJ SOLN
INTRAMUSCULAR | Status: DC | PRN
  Administered 2011-11-03 (×3): 25 ug via INTRAVENOUS

## 2011-11-03 MED ORDER — ZOLPIDEM TARTRATE 5 MG PO TABS: 5.0000 mg | ORAL_TABLET | Freq: Every evening | ORAL | Status: DC | PRN

## 2011-11-03 MED ORDER — ACETAMINOPHEN 325 MG PO TABS: 650.0000 mg | ORAL_TABLET | ORAL | Status: DC | PRN

## 2011-11-03 MED ORDER — IOHEXOL 300 MG/ML  SOLN
150.0000 mL | Freq: Once | INTRAMUSCULAR | Status: AC | PRN
  Administered 2011-11-03: 60 mL via INTRAVENOUS

## 2011-11-03 MED ORDER — ASPIRIN EC 325 MG PO TBEC
DELAYED_RELEASE_TABLET | ORAL | Status: AC
  Administered 2011-11-03: 325 mg
  Filled 2011-11-03: qty 1

## 2011-11-03 MED ORDER — LACTATED RINGERS IV SOLN: INTRAVENOUS | Status: AC

## 2011-11-03 NOTE — Procedures (Signed)
S/P 4 vessel cerebral arteriogram. Rt CFA approach. No acute complications. Full report to follow.

## 2011-11-03 NOTE — H&P (Signed)
[name] is an 55 y.o. male.   Chief Complaint: Intermittent right sided headache, right arm and leg numbness, weakness and expressive aphasia since Thanksgiving. HPI: Intermittent right sided weakness, numbness and heaviness with intermittent expressive aphasia.  Denies memory loss, visual changes or syncopal events.   Past Medical History  Diagnosis Date  . Cancer   . Hypertension   . Sleep apnea   . High cholesterol   . Arthritis     Past Surgical History  Procedure Date  . Nephrectomy   . Carpal tunnel release     History reviewed. No pertinent family history. Social History:  does not have a smoking history on file. He does not have any smokeless tobacco history on file. He reports that he drinks alcohol. He reports that he does not use illicit drugs.  Allergies: No Known Allergies  Medications Prior to Admission  Medication Dose Route Frequency Provider Last Rate Last Dose  . acetaminophen (TYLENOL) tablet 650 mg  650 mg Oral Q4H PRN Otilio Miu, PA      . aspirin EC 325 MG tablet        325 mg at 11/03/11 0008  . aspirin tablet 325 mg  325 mg Oral Daily Otilio Miu, PA       No current outpatient prescriptions on file as of 11/02/2011.    Results for orders placed during the hospital encounter of 11/02/11 (from the past 48 hour(s))  CBC     Status: Abnormal   Collection Time   11/02/11  9:36 PM      Component Value Range Comment   WBC 8.8  4.0 - 10.5 (K/uL)    RBC 4.53  4.22 - 5.81 (MIL/uL)    Hemoglobin 13.3  13.0 - 17.0 (g/dL)    HCT 95.6 (*) 21.3 - 52.0 (%)    MCV 84.8  78.0 - 100.0 (fL)    MCH 29.4  26.0 - 34.0 (pg)    MCHC 34.6  30.0 - 36.0 (g/dL)    RDW 08.6  57.8 - 46.9 (%)    Platelets 234  150 - 400 (K/uL)   BASIC METABOLIC PANEL     Status: Abnormal   Collection Time   11/02/11  9:36 PM      Component Value Range Comment   Sodium 140  135 - 145 (mEq/L)    Potassium 4.1  3.5 - 5.1 (mEq/L)    Chloride 104  96 - 112  (mEq/L)    CO2 25  19 - 32 (mEq/L)    Glucose, Bld 100 (*) 70 - 99 (mg/dL)    BUN 18  6 - 23 (mg/dL)    Creatinine, Ser 6.29 (*) 0.50 - 1.35 (mg/dL)    Calcium 9.6  8.4 - 10.5 (mg/dL)    GFR calc non Af Amer 55 (*) >90 (mL/min)    GFR calc Af Amer 63 (*) >90 (mL/min)   URINALYSIS, ROUTINE W REFLEX MICROSCOPIC     Status: Normal   Collection Time   11/02/11  9:37 PM      Component Value Range Comment   Color, Urine YELLOW  YELLOW     APPearance CLEAR  CLEAR     Specific Gravity, Urine 1.018  1.005 - 1.030     pH 6.0  5.0 - 8.0     Glucose, UA NEGATIVE  NEGATIVE (mg/dL)    Hgb urine dipstick NEGATIVE  NEGATIVE     Bilirubin Urine NEGATIVE  NEGATIVE  Ketones, ur NEGATIVE  NEGATIVE (mg/dL)    Protein, ur NEGATIVE  NEGATIVE (mg/dL)    Urobilinogen, UA 0.2  0.0 - 1.0 (mg/dL)    Nitrite NEGATIVE  NEGATIVE     Leukocytes, UA NEGATIVE  NEGATIVE  MICROSCOPIC NOT DONE ON URINES WITH NEGATIVE PROTEIN, BLOOD, LEUKOCYTES, NITRITE, OR GLUCOSE <1000 mg/dL.  PROTIME-INR     Status: Normal   Collection Time   11/03/11 12:27 AM      Component Value Range Comment   Prothrombin Time 14.4  11.6 - 15.2 (seconds)    INR 1.10  0.00 - 1.49    APTT     Status: Normal   Collection Time   11/03/11 12:27 AM      Component Value Range Comment   aPTT 35  24 - 37 (seconds)   CARDIAC PANEL(CRET KIN+CKTOT+MB+TROPI)     Status: Normal   Collection Time   11/03/11 12:28 AM      Component Value Range Comment   Total CK 168  7 - 232 (U/L)    CK, MB 3.2  0.3 - 4.0 (ng/mL)    Troponin I <0.30  <0.30 (ng/mL)    Relative Index 1.9  0.0 - 2.5    COMPREHENSIVE METABOLIC PANEL     Status: Abnormal   Collection Time   11/03/11  4:40 AM      Component Value Range Comment   Sodium 140  135 - 145 (mEq/L)    Potassium 3.5  3.5 - 5.1 (mEq/L)    Chloride 105  96 - 112 (mEq/L)    CO2 25  19 - 32 (mEq/L)    Glucose, Bld 116 (*) 70 - 99 (mg/dL)    BUN 19  6 - 23 (mg/dL)    Creatinine, Ser 2.95 (*) 0.50 - 1.35  (mg/dL)    Calcium 9.3  8.4 - 10.5 (mg/dL)    Total Protein 6.4  6.0 - 8.3 (g/dL)    Albumin 3.8  3.5 - 5.2 (g/dL)    AST 17  0 - 37 (U/L)    ALT 21  0 - 53 (U/L)    Alkaline Phosphatase 41  39 - 117 (U/L)    Total Bilirubin 0.3  0.3 - 1.2 (mg/dL)    GFR calc non Af Amer 54 (*) >90 (mL/min)    GFR calc Af Amer 63 (*) >90 (mL/min)   LIPID PANEL     Status: Abnormal   Collection Time   11/03/11  4:41 AM      Component Value Range Comment   Cholesterol 216 (*) 0 - 200 (mg/dL)    Triglycerides 621 (*) <150 (mg/dL) LIPEMIC SPECIMEN   HDL 20 (*) >39 (mg/dL)    Total CHOL/HDL Ratio 10.8      VLDL UNABLE TO CALCULATE IF TRIGLYCERIDE OVER 400 mg/dL  0 - 40 (mg/dL)    LDL Cholesterol UNABLE TO CALCULATE IF TRIGLYCERIDE OVER 400 mg/dL  0 - 99 (mg/dL)    Ct Head Wo Contrast  11/02/2011  *RADIOLOGY REPORT*  Clinical Data: Weakness and right arm tingling; confusion. Hypertension.  CT HEAD WITHOUT CONTRAST  Technique:  Contiguous axial images were obtained from the base of the skull through the vertex without contrast.  Comparison: None.  Findings: There is no evidence of acute infarction, mass lesion, or intra- or extra-axial hemorrhage on CT.  The posterior fossa, including the cerebellum, brainstem and fourth ventricle, is within normal limits.  The third and lateral ventricles, and basal ganglia are unremarkable  in appearance.  The cerebral hemispheres are symmetric in appearance, with normal gray- white differentiation.  No mass effect or midline shift is seen.  There is no evidence of fracture; visualized osseous structures are unremarkable in appearance.  The visualized portions of the orbits are within normal limits.  The paranasal sinuses and mastoid air cells are well-aerated.  No significant soft tissue abnormalities are seen.  IMPRESSION: Unremarkable noncontrast CT of the head.  Original Report Authenticated By: Tonia Ghent, M.D.   Mri Brain Without Contrast  11/03/2011  *RADIOLOGY REPORT*   Clinical Data:  Episode of right-sided weakness 1 week ago.  Now expressive aphasia.  MRI HEAD WITHOUT CONTRAST MRA HEAD WITHOUT CONTRAST  Technique:  Multiplanar, multiecho pulse sequences of the brain and surrounding structures were obtained without intravenous contrast. Angiographic images of the head were obtained using MRA technique without contrast.  Comparison:  CT head without contrast 11/02/2011 at [facility].  MRI HEAD  Findings:  The diffusion weighted images demonstrate no evidence for acute or subacute infarction.  No hemorrhage or mass lesion is present.  The ventricles are of normal size.  No significant extra-axial fluid collection is present.  Mild diffuse mucosal thickening is seen throughout the paranasal sinuses.  No air-fluid levels are present.  The mastoid air cells are clear.  IMPRESSION:  1.  Normal MRI appearance of the brain. 2.  Mild mucosal thickening throughout the sinuses.  MRA HEAD  Findings: There is an infundibulum of the left posterior communicating artery.  Minimal irregularity is present within the cavernous carotid arteries bilaterally.  There is signal loss at the left ICA terminus and within the proximal left A1 and M1 segments with flow in the more distal left middle cerebral artery. The MCA bifurcations are within normal limits.  There is decreased signal within the left MCA branches compared to the right, suggesting decreased flow.  Both A2 segments fill from the left. The anterior communicating artery is patent.  The right A1 segment is aplastic.  The vertebral arteries are codominant.  The right PICA origin is visualized and normal.  The left AICA is the dominant.  The basilar artery is normal.  The left posterior cerebral artery originates from the basilar tip.  The right posterior cerebral artery is of fetal type with only a small P1 segment.  The PCA branch vessels are within normal limits bilaterally.  IMPRESSION:  1.  Signal loss within the terminal left  internal carotid artery and proximal left M1 and A1 segments with more normal appearing flow distal.  This may represent an incomplete occlusion or significant flow disturbance.  Conventional angiography may be useful for further evaluation. 2.  Decreased signal intensity within the left MCA branch vessels. This suggests decreased flow to the left MCA vessels. 3.  There is some signal loss in the proximal left A1 segment as well, suggesting partial occlusion.  This takes on increased relevance as there is no right A1 segment and both anterior cerebral arteries are fed from the left.  These results were called by telephone on 11/03/2011  at  8:15 am to  Fayrene Helper, PA in the CDU., who verbally acknowledged these results.  Original Report Authenticated By: Jamesetta Orleans. MATTERN, M.D.   Mr Maxine Glenn Head/brain Wo Cm  11/03/2011  *RADIOLOGY REPORT*  Clinical Data:  Episode of right-sided weakness 1 week ago.  Now expressive aphasia.  MRI HEAD WITHOUT CONTRAST MRA HEAD WITHOUT CONTRAST  Technique:  Multiplanar, multiecho pulse  sequences of the brain and surrounding structures were obtained without intravenous contrast. Angiographic images of the head were obtained using MRA technique without contrast.  Comparison:  CT head without contrast 11/02/2011 at [facility].  MRI HEAD  Findings:  The diffusion weighted images demonstrate no evidence for acute or subacute infarction.  No hemorrhage or mass lesion is present.  The ventricles are of normal size.  No significant extra-axial fluid collection is present.  Mild diffuse mucosal thickening is seen throughout the paranasal sinuses.  No air-fluid levels are present.  The mastoid air cells are clear.  IMPRESSION:  1.  Normal MRI appearance of the brain. 2.  Mild mucosal thickening throughout the sinuses.  MRA HEAD  Findings: There is an infundibulum of the left posterior communicating artery.  Minimal irregularity is present within the cavernous carotid arteries  bilaterally.  There is signal loss at the left ICA terminus and within the proximal left A1 and M1 segments with flow in the more distal left middle cerebral artery. The MCA bifurcations are within normal limits.  There is decreased signal within the left MCA branches compared to the right, suggesting decreased flow.  Both A2 segments fill from the left. The anterior communicating artery is patent.  The right A1 segment is aplastic.  The vertebral arteries are codominant.  The right PICA origin is visualized and normal.  The left AICA is the dominant.  The basilar artery is normal.  The left posterior cerebral artery originates from the basilar tip.  The right posterior cerebral artery is of fetal type with only a small P1 segment.  The PCA branch vessels are within normal limits bilaterally.  IMPRESSION:  1.  Signal loss within the terminal left internal carotid artery and proximal left M1 and A1 segments with more normal appearing flow distal.  This may represent an incomplete occlusion or significant flow disturbance.  Conventional angiography may be useful for further evaluation. 2.  Decreased signal intensity within the left MCA branch vessels. This suggests decreased flow to the left MCA vessels. 3.  There is some signal loss in the proximal left A1 segment as well, suggesting partial occlusion.  This takes on increased relevance as there is no right A1 segment and both anterior cerebral arteries are fed from the left.  These results were called by telephone on 11/03/2011  at  8:15 am to  Fayrene Helper, PA in the CDU., who verbally acknowledged these results.  Original Report Authenticated By: Jamesetta Orleans. MATTERN, M.D.    Review of Systems  Constitutional: Negative for fever and chills.  Eyes: Negative.   Respiratory: Negative for cough and hemoptysis.   Cardiovascular: Negative for chest pain, palpitations, claudication, leg swelling and PND.  Gastrointestinal: Negative for heartburn and nausea.    Skin: Negative.   Neurological: Positive for sensory change, speech change, focal weakness and headaches. Negative for dizziness, tingling, tremors, seizures and loss of consciousness.       Expressive aphasia  Endo/Heme/Allergies: Negative.   Psychiatric/Behavioral: Negative for depression, memory loss and substance abuse. The patient is not nervous/anxious and does not have insomnia.     Blood pressure 136/87, pulse 65, temperature 97.9 F (36.6 C), temperature source Oral, resp. rate 20, height 5\' 10"  (1.778 m), weight 230 lb (104.327 kg), SpO2 99.00%. Physical Exam  Constitutional: He is oriented to person, place, and time. He appears well-developed and well-nourished. No distress.  HENT:  Head: Normocephalic and atraumatic.  Cardiovascular: Normal rate, regular  rhythm, normal heart sounds and intact distal pulses.  Exam reveals no gallop and no friction rub.   No murmur heard. Respiratory: Effort normal and breath sounds normal. He has no rales. He exhibits no tenderness.  GI: Soft. Bowel sounds are normal.  Musculoskeletal: Normal range of motion. He exhibits no edema and no tenderness.       No strength deficits noted of upper or lower extremities.   Neurological: He is oriented to person, place, and time.  Skin: Skin is warm and dry.  Psychiatric: He has a normal mood and affect. His behavior is normal. Judgment and thought content normal.    Assessment/Plan Intracranial stenosis with patient experiencing symptoms.  Discussed with patient and spouse necessity for cerebral arteriogram to better evaluate vascular flow and possibilities of intracranial intervention.  Risks of the arteriogram discussed were that of bleeding, infection, worsening renal insufficiency, contrast reaction, complications with moderate sedation and possible stroke.  They are in agreement to proceed at this time with only angiogram.  They would like to speak with Dr. Pearlean Brownie first before deciding on any  intervention if necessary.  Dr. Thad Ranger with neurology made aware.  Written consent obtained to proceed with angiogram.    Shiela Bruns D 11/03/2011, 10:53 AM

## 2011-11-03 NOTE — Progress Notes (Signed)
Preminary  Findings. 1 Severe pro occlusive stenosis of Left Middle cerebral artery at its origin.

## 2011-11-03 NOTE — ED Notes (Signed)
Report called to linda on yellow team.

## 2011-11-03 NOTE — ED Notes (Signed)
Patient currently sitting up in bed; no respiratory or acute distress noted.  Wife present at bedside.  Updated patient on plan of care; patient aware that he be sent to CDU for observation.  Patient has no other questions or concerns at this time.  Will continue to monitor.

## 2011-11-03 NOTE — ED Notes (Signed)
Gave report to Ramon, RN. 

## 2011-11-03 NOTE — ED Provider Notes (Signed)
Medical screening examination/treatment/procedure(s) were performed by non-physician practitioner and as supervising physician I was immediately available for consultation/collaboration.   Johnross Nabozny L Camiya Vinal, MD 11/03/11 1459 

## 2011-11-03 NOTE — ED Notes (Signed)
Family at bedside. 

## 2011-11-03 NOTE — Plan of Care (Signed)
Problem: Consults Goal: Stroke - Ischemic/TIA Patient Education See Patient Education Module for education specifics. Outcome: Progressing Discussed s/s of CVA/TIA and need to call 911 with any further symptoms.

## 2011-11-03 NOTE — ED Notes (Signed)
Gave pt . aurinal  To use forsample.

## 2011-11-03 NOTE — ED Provider Notes (Signed)
This is a 55 year old male presenting to the ED with chief complaints of intermittent right sided numbness and weakness with slurred speech and trouble forming thoughts. Symptoms started about 1 week ago. He does have a mild headache that lasted for a day and ended yesterday. He was placed in the CDU under a TIA protocol. Patient states he is back to his normal baseline and this symptom has resolved. Radiologist, Dr. Lavona Mound has called and report that the MRI result shows signs worrisome for a possible clots, that is causing a near occlusion to the ICA termination on the left side. I have talked to my attending in regards to this finding. I will be consult to neurology for further management. Patient will likely need to be admitted. Patient is currently in no acute distress, vital signs stable, no focal neuro deficit.  8:32 AM I have spoken with Dr. Thad Ranger from neurology in regards to the positive finding. Dr. Thad Ranger will consult with the neurology team, for further management. I discussed with the patient in regards to the positive finding. Patient is aware, and agree with plan. He is to be n.p.o. at this time.  9:06 AM Dr. Thad Ranger, neurologist, will see patient in the ED. She also requests for patient to be admitted with triad hospitalist. Patient will be admitted to triad hospitalist, team 3. He will be on a telemetry bed. He will be admitted for a TIA versus stroke.  Fayrene Helper, PA 11/03/11 1108

## 2011-11-03 NOTE — Progress Notes (Signed)
*  PRELIMINARY RESULTS*   Carotid Dopplers have been performed.  No significant ICA stenosis noted.  Vertebral artery flow is antegrade.  [name] 11/03/2011, 9:29 AM

## 2011-11-03 NOTE — Progress Notes (Signed)
  Echocardiogram 2D Echocardiogram has been performed.  Mercy Moore 11/03/2011, 10:39 AM

## 2011-11-03 NOTE — Consult Note (Signed)
Referring Physician: Irene Limbo    Chief Complaint: Difficulty with speech, right sided weakness  HPI: [name] is an 55 y.o. male who reports that on Thanksgiving he noted difficulty using his right hand before going to work.  Had difficulty showering and shaving.  Symptoms improved with the day but on Friday noted difficulty getting his words out and some mild right upper extremity weakness.  This improved as well and did well over the weekend.  On yesterday when he got off work at about 4am noted numbness in his right upper and lower extremity.  By 6pm was having difficulty with speech again as well. Wife prompted patient to present  LSN: [id number] on 11/28 tPA Given: No: Outside time window  Past Medical History  Diagnosis Date  . Cancer   . Hypertension   . Sleep apnea   . High cholesterol   . Arthritis     Past Surgical History  Procedure Date  . Nephrectomy   . Carpal tunnel release     History reviewed. No pertinent family history. Social History:  does not have a smoking history on file. He does not have any smokeless tobacco history on file. He reports that he drinks alcohol. He reports that he does not use illicit drugs.  Allergies: No Known Allergies  Medications: I have reviewed the patient's current medications. Amlodipine, ASA, Colesevelam, Fish oil, B6  ROS: History obtained from the patient  General ROS: negative for - chills, fatigue, fever, night sweats, weight gain or weight loss Psychological ROS: negative for - behavioral disorder, hallucinations, memory difficulties, mood swings or suicidal ideation Ophthalmic ROS: negative for - blurry vision, double vision, eye pain or loss of vision ENT ROS: negative for - epistaxis, nasal discharge, oral lesions, sore throat, tinnitus or vertigo Allergy and Immunology ROS: negative for - hives or itchy/watery eyes Hematological and Lymphatic ROS: negative for - bleeding problems, bruising or swollen lymph  nodes Endocrine ROS: negative for - galactorrhea, hair pattern changes, polydipsia/polyuria or temperature intolerance Respiratory ROS: negative for - cough, hemoptysis, shortness of breath or wheezing Cardiovascular ROS: negative for - chest pain, dyspnea on exertion, edema or irregular heartbeat Gastrointestinal ROS: negative for - abdominal pain, diarrhea, hematemesis, nausea/vomiting or stool incontinence Genito-Urinary ROS: negative for - dysuria, hematuria, incontinence or urinary frequency/urgency Musculoskeletal ROS: negative for - joint swelling or muscular weakness Neurological ROS: as noted in HPI Dermatological ROS: negative for rash and skin lesion changes  Physical Examination: Blood pressure 125/73, pulse 61, temperature 97.9 F (36.6 C), temperature source Oral, resp. rate 13, height 5\' 10"  (1.778 m), weight 104.327 kg (230 lb), SpO2 98.00%.  Neurologic Examination: Mental Status: Alert, oriented, thought content appropriate.  Speech fluent without evidence of aphasia.  Able to follow 3 step commands without difficulty. Cranial Nerves: II: visual fields grossly normal, pupils equal, round, reactive to light and accommodation III,IV, VI: ptosis not present, extraocular muscles extra-ocular motions intact bilaterally V,VII: smile symmetric, facial light touch sensation normal bilaterally VIII: hearing normal bilaterally IX,X: gag reflex present XI: trapezius strength/neck flexion strength normal bilaterally XII: tongue strength normal  Motor: Right : Upper extremity    Left:     Upper extremity 5/5 deltoid       5/5 deltoid 5/5 biceps      5/5 biceps  5/5 triceps      5/5 triceps 5/5wrist flexion     5/5 wrist flexion 5/5 wrist extension     5/5 wrist extension 5/5 hand grip  5/5 hand grip  Lower extremity     Lower extremity 5/5 hip flexor      5/5 hip flexor 5/5 hip adductors     5/5 hip adductors 5/5 hip abductors     5/5 hip abductors 5/5 quadricep      5/5  quadriceps  5/5 hamstrings     5/5 hamstrings 5/5 plantar flexion       5/5 plantar flexion 5/5 plantar extension     5/5 plantar extension Tone and bulk:normal tone throughout; no atrophy noted Sensory: Pinprick and light touch intact throughout, bilaterally Deep Tendon Reflexes: 2+ and symmetric throughout Plantars: Right: downgoing   Left: downgoing Cerebellar: normal finger-to-nose, normal rapid alternating movements and normal heel-to-shin test   Results for orders placed during the hospital encounter of 11/02/11 (from the past 48 hour(s))  CBC     Status: Abnormal   Collection Time   11/02/11  9:36 PM      Component Value Range Comment   WBC 8.8  4.0 - 10.5 (K/uL)    RBC 4.53  4.22 - 5.81 (MIL/uL)    Hemoglobin 13.3  13.0 - 17.0 (g/dL)    HCT 16.1 (*) 09.6 - 52.0 (%)    MCV 84.8  78.0 - 100.0 (fL)    MCH 29.4  26.0 - 34.0 (pg)    MCHC 34.6  30.0 - 36.0 (g/dL)    RDW 04.5  40.9 - 81.1 (%)    Platelets 234  150 - 400 (K/uL)   BASIC METABOLIC PANEL     Status: Abnormal   Collection Time   11/02/11  9:36 PM      Component Value Range Comment   Sodium 140  135 - 145 (mEq/L)    Potassium 4.1  3.5 - 5.1 (mEq/L)    Chloride 104  96 - 112 (mEq/L)    CO2 25  19 - 32 (mEq/L)    Glucose, Bld 100 (*) 70 - 99 (mg/dL)    BUN 18  6 - 23 (mg/dL)    Creatinine, Ser 9.14 (*) 0.50 - 1.35 (mg/dL)    Calcium 9.6  8.4 - 10.5 (mg/dL)    GFR calc non Af Amer 55 (*) >90 (mL/min)    GFR calc Af Amer 63 (*) >90 (mL/min)   URINALYSIS, ROUTINE W REFLEX MICROSCOPIC     Status: Normal   Collection Time   11/02/11  9:37 PM      Component Value Range Comment   Color, Urine YELLOW  YELLOW     APPearance CLEAR  CLEAR     Specific Gravity, Urine 1.018  1.005 - 1.030     pH 6.0  5.0 - 8.0     Glucose, UA NEGATIVE  NEGATIVE (mg/dL)    Hgb urine dipstick NEGATIVE  NEGATIVE     Bilirubin Urine NEGATIVE  NEGATIVE     Ketones, ur NEGATIVE  NEGATIVE (mg/dL)    Protein, ur NEGATIVE  NEGATIVE (mg/dL)      Urobilinogen, UA 0.2  0.0 - 1.0 (mg/dL)    Nitrite NEGATIVE  NEGATIVE     Leukocytes, UA NEGATIVE  NEGATIVE  MICROSCOPIC NOT DONE ON URINES WITH NEGATIVE PROTEIN, BLOOD, LEUKOCYTES, NITRITE, OR GLUCOSE <1000 mg/dL.  PROTIME-INR     Status: Normal   Collection Time   11/03/11 12:27 AM      Component Value Range Comment   Prothrombin Time 14.4  11.6 - 15.2 (seconds)    INR 1.10  0.00 - 1.49  APTT     Status: Normal   Collection Time   11/03/11 12:27 AM      Component Value Range Comment   aPTT 35  24 - 37 (seconds)   HEMOGLOBIN A1C     Status: Normal   Collection Time   11/03/11 12:27 AM      Component Value Range Comment   Hemoglobin A1C 5.3  <5.7 (%)    Mean Plasma Glucose 105  <117 (mg/dL)   CARDIAC PANEL(CRET KIN+CKTOT+MB+TROPI)     Status: Normal   Collection Time   11/03/11 12:28 AM      Component Value Range Comment   Total CK 168  7 - 232 (U/L)    CK, MB 3.2  0.3 - 4.0 (ng/mL)    Troponin I <0.30  <0.30 (ng/mL)    Relative Index 1.9  0.0 - 2.5    COMPREHENSIVE METABOLIC PANEL     Status: Abnormal   Collection Time   11/03/11  4:40 AM      Component Value Range Comment   Sodium 140  135 - 145 (mEq/L)    Potassium 3.5  3.5 - 5.1 (mEq/L)    Chloride 105  96 - 112 (mEq/L)    CO2 25  19 - 32 (mEq/L)    Glucose, Bld 116 (*) 70 - 99 (mg/dL)    BUN 19  6 - 23 (mg/dL)    Creatinine, Ser 1.61 (*) 0.50 - 1.35 (mg/dL)    Calcium 9.3  8.4 - 10.5 (mg/dL)    Total Protein 6.4  6.0 - 8.3 (g/dL)    Albumin 3.8  3.5 - 5.2 (g/dL)    AST 17  0 - 37 (U/L)    ALT 21  0 - 53 (U/L)    Alkaline Phosphatase 41  39 - 117 (U/L)    Total Bilirubin 0.3  0.3 - 1.2 (mg/dL)    GFR calc non Af Amer 54 (*) >90 (mL/min)    GFR calc Af Amer 63 (*) >90 (mL/min)   LIPID PANEL     Status: Abnormal   Collection Time   11/03/11  4:41 AM      Component Value Range Comment   Cholesterol 216 (*) 0 - 200 (mg/dL)    Triglycerides 096 (*) <150 (mg/dL) LIPEMIC SPECIMEN   HDL 20 (*) >39 (mg/dL)     Total CHOL/HDL Ratio 10.8      VLDL UNABLE TO CALCULATE IF TRIGLYCERIDE OVER 400 mg/dL  0 - 40 (mg/dL)    LDL Cholesterol UNABLE TO CALCULATE IF TRIGLYCERIDE OVER 400 mg/dL  0 - 99 (mg/dL)    Ct Head Wo Contrast  11/02/2011  *RADIOLOGY REPORT*  Clinical Data: Weakness and right arm tingling; confusion. Hypertension.  CT HEAD WITHOUT CONTRAST  Technique:  Contiguous axial images were obtained from the base of the skull through the vertex without contrast.  Comparison: None.  Findings: There is no evidence of acute infarction, mass lesion, or intra- or extra-axial hemorrhage on CT.  The posterior fossa, including the cerebellum, brainstem and fourth ventricle, is within normal limits.  The third and lateral ventricles, and basal ganglia are unremarkable in appearance.  The cerebral hemispheres are symmetric in appearance, with normal gray- white differentiation.  No mass effect or midline shift is seen.  There is no evidence of fracture; visualized osseous structures are unremarkable in appearance.  The visualized portions of the orbits are within normal limits.  The paranasal sinuses and mastoid air cells are well-aerated.  No significant soft tissue abnormalities are seen.  IMPRESSION: Unremarkable noncontrast CT of the head.  Original Report Authenticated By: Tonia Ghent, M.D.   Mri Brain Without Contrast  11/03/2011  *RADIOLOGY REPORT*  Clinical Data:  Episode of right-sided weakness 1 week ago.  Now expressive aphasia.  MRI HEAD WITHOUT CONTRAST MRA HEAD WITHOUT CONTRAST  Technique:  Multiplanar, multiecho pulse sequences of the brain and surrounding structures were obtained without intravenous contrast. Angiographic images of the head were obtained using MRA technique without contrast.  Comparison:  CT head without contrast 11/02/2011 at [facility].  MRI HEAD  Findings:  The diffusion weighted images demonstrate no evidence for acute or subacute infarction.  No hemorrhage or mass lesion is  present.  The ventricles are of normal size.  No significant extra-axial fluid collection is present.  Mild diffuse mucosal thickening is seen throughout the paranasal sinuses.  No air-fluid levels are present.  The mastoid air cells are clear.  IMPRESSION:  1.  Normal MRI appearance of the brain. 2.  Mild mucosal thickening throughout the sinuses.  MRA HEAD  Findings: There is an infundibulum of the left posterior communicating artery.  Minimal irregularity is present within the cavernous carotid arteries bilaterally.  There is signal loss at the left ICA terminus and within the proximal left A1 and M1 segments with flow in the more distal left middle cerebral artery. The MCA bifurcations are within normal limits.  There is decreased signal within the left MCA branches compared to the right, suggesting decreased flow.  Both A2 segments fill from the left. The anterior communicating artery is patent.  The right A1 segment is aplastic.  The vertebral arteries are codominant.  The right PICA origin is visualized and normal.  The left AICA is the dominant.  The basilar artery is normal.  The left posterior cerebral artery originates from the basilar tip.  The right posterior cerebral artery is of fetal type with only a small P1 segment.  The PCA branch vessels are within normal limits bilaterally.  IMPRESSION:  1.  Signal loss within the terminal left internal carotid artery and proximal left M1 and A1 segments with more normal appearing flow distal.  This may represent an incomplete occlusion or significant flow disturbance.  Conventional angiography may be useful for further evaluation. 2.  Decreased signal intensity within the left MCA branch vessels. This suggests decreased flow to the left MCA vessels. 3.  There is some signal loss in the proximal left A1 segment as well, suggesting partial occlusion.  This takes on increased relevance as there is no right A1 segment and both anterior cerebral arteries are fed  from the left.  These results were called by telephone on 11/03/2011  at  8:15 am to  Fayrene Helper, PA in the [facility]., who verbally acknowledged these results.  Original Report Authenticated By: Jamesetta Orleans. MATTERN, M.D.   Mr Maxine Glenn Head/brain Wo Cm  11/03/2011  *RADIOLOGY REPORT*  Clinical Data:  Episode of right-sided weakness 1 week ago.  Now expressive aphasia.  MRI HEAD WITHOUT CONTRAST MRA HEAD WITHOUT CONTRAST  Technique:  Multiplanar, multiecho pulse sequences of the brain and surrounding structures were obtained without intravenous contrast. Angiographic images of the head were obtained using MRA technique without contrast.  Comparison:  CT head without contrast 11/02/2011 at [facility].  MRI HEAD  Findings:  The diffusion weighted images demonstrate no evidence for acute or subacute infarction.  No hemorrhage or mass lesion  is present.  The ventricles are of normal size.  No significant extra-axial fluid collection is present.  Mild diffuse mucosal thickening is seen throughout the paranasal sinuses.  No air-fluid levels are present.  The mastoid air cells are clear.  IMPRESSION:  1.  Normal MRI appearance of the brain. 2.  Mild mucosal thickening throughout the sinuses.  MRA HEAD  Findings: There is an infundibulum of the left posterior communicating artery.  Minimal irregularity is present within the cavernous carotid arteries bilaterally.  There is signal loss at the left ICA terminus and within the proximal left A1 and M1 segments with flow in the more distal left middle cerebral artery. The MCA bifurcations are within normal limits.  There is decreased signal within the left MCA branches compared to the right, suggesting decreased flow.  Both A2 segments fill from the left. The anterior communicating artery is patent.  The right A1 segment is aplastic.  The vertebral arteries are codominant.  The right PICA origin is visualized and normal.  The left AICA is the dominant.  The basilar artery  is normal.  The left posterior cerebral artery originates from the basilar tip.  The right posterior cerebral artery is of fetal type with only a small P1 segment.  The PCA branch vessels are within normal limits bilaterally.  IMPRESSION:  1.  Signal loss within the terminal left internal carotid artery and proximal left M1 and A1 segments with more normal appearing flow distal.  This may represent an incomplete occlusion or significant flow disturbance.  Conventional angiography may be useful for further evaluation. 2.  Decreased signal intensity within the left MCA branch vessels. This suggests decreased flow to the left MCA vessels. 3.  There is some signal loss in the proximal left A1 segment as well, suggesting partial occlusion.  This takes on increased relevance as there is no right A1 segment and both anterior cerebral arteries are fed from the left.  These results were called by telephone on 11/03/2011  at  8:15 am to  Fayrene Helper, PA in the [facility]., who verbally acknowledged these results.  Original Report Authenticated By: Jamesetta Orleans. MATTERN, M.D.    Assessment: 55 y.o. male presenting with left hemispheric TIA's.  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  No evidence of acute stroke on MR imaging.  No focality on exam at this time.  Further work up and intervention recommended.  Stroke Risk Factors - hyperlipidemia and hypertension  Plan: 1. HgbA1c, fasting lipid panel 2. Echocardiogram 3. Patient to have a catheter angiogram.  Dr. Corliss Skains contacted and patient is being scheduled today 6. IV Heparin to be started without bolus.  Pharmacy to oversee 5. Vascular surgery consult-[facility] contacted 7. Risk factor modification     Thana Farr, MD [facility] [phone] 11/03/2011, 12:13 PM

## 2011-11-03 NOTE — Progress Notes (Signed)
May resume IV heparin at 7.30 pm.tonight.

## 2011-11-03 NOTE — Progress Notes (Addendum)
Vascular surgery note:  This is a 55 year old gentleman who was admitted with a left hemispheric TIA. On his MRA of the brain it was noted that there was significant flow disturbance or incomplete occlusion of the terminal left internal carotid artery. The patient underwent cerebral arteriography. If the patient had significant carotid disease, vascular surgery was to be consulted for consideration of carotid endarterectomy. I have reviewed the cerebral arteriogram with Dr. Corliss Skains. The patient has a significant intracranial stenosis on the left which could potentially explain his symptoms. However, there is no significant extracranial carotid artery occlusive disease. I discussed this with the patient and his wife. Given that there is no role for vascular surgery involvement in this case, they are agreeable that we did not formally consult.  Di Kindle. Edilia Bo, MD, FACS Beeper [phone] 11/03/2011

## 2011-11-03 NOTE — H&P (Signed)
History and Physical  [name] BJY:[id number] DOB: March 16, 1956 DOA: 11/02/2011  Referring physician: [id number] Fayrene Helper, PA PCP: Warrick Parisian, MD  Urologist: Marcine Matar  Chief Complaint: Weakness  HPI:  55 year old man awoke yesterday morning at 4 AM with numbness as well as weakness in the right arm and mildly in the right leg. His wife noted that his speech was "not normal". He could not understand what his wife was saying today.  Per his wife he was noted to be acting differently on Thanksgiving (different affect, could not answer questions, had to continuously repeat things to patient, etc). Symptoms have been intermittent since that time. Patient has been able to continue to work at the [employer] department.  Dr. Thad Ranger of neurology evaluated the emergency department. Currently the patient's symptoms have resolved.  Review of Systems:  Negative for fever, changes to his vision, sore throat, rash, muscle aches, chest pain, shortness of breath, dysuria, bleeding, nausea, vomiting, abdominal pain, diarrhea.  Past Medical History  Diagnosis Date  . Renal cell carcinoma   . Hypertension   . Sleep apnea     CPAP  . High cholesterol   . Arthritis    Past Surgical History  Procedure Date  . Nephrectomy   . Carpal tunnel release   . Knee arthroscopy   . Anterior cruciate ligament repair   . Biceps tendon repair    Social History:  reports that he has quit smoking. He does not have any smokeless tobacco history on file. He reports that he drinks about one ounce of alcohol per week. He reports that he does not use illicit drugs.  No Known Allergies  Family History  Problem Relation Age of Onset  . Coronary artery disease Father     Prior to Admission medications   Medication Sig Start Date End Date Taking? Authorizing Provider  amLODipine-olmesartan (AZOR) 5-40 MG per tablet Take 1 tablet by mouth daily.     Yes Historical Provider, MD  aspirin EC 81 MG tablet  Take 81 mg by mouth daily.     Yes Historical Provider, MD  Colesevelam HCl 3.75 G PACK Take 3.75 g by mouth every morning.     Yes Historical Provider, MD  fish oil-omega-3 fatty acids 1000 MG capsule Take 1 g by mouth daily.     Yes Historical Provider, MD  pyridoxine (B-6) 200 MG tablet Take 200 mg by mouth daily.     Yes Historical Provider, MD   Physical Exam: Blood pressure 136/87, pulse 65, temperature 97.9 F (36.6 C), temperature source Oral, resp. rate 20, height 5\' 10"  (1.778 m), weight 104.327 kg (230 lb), SpO2 99.00%.   General:  Well-appearing. Sitting up in stretcher in the emergency department.  Eyes: Pupils equal, round and react to. Normal lids, irises and conjunctivae.  ENT: Grossly normal hearing. Lips and tongue appear unremarkable.  Neck: No lymphadenopathy or masses. No thyromegaly.  Cardiovascular: Regular rate and rhythm. No murmur, rub or gallop. No lower short edema.  Respiratory: Clear to auscultation bilaterally. No wheezes, rales or rhonchi. Normal respiratory effort.  Abdomen: Soft, nontender nondistended.  Skin: Appears grossly unremarkable.  Musculoskeletal: Tone and strength symmetric in the upper and lower extremities 5/5.  Psychiatric: Grossly normal mood and affect. Speech fluent and appropriate.  Neurologic: Cranial nerves 2-12 intact.  Labs on Admission:  Basic Metabolic Panel:  Lab 11/03/11 8469 11/02/11 2136  NA 140 140  K 3.5 4.1  CL 105 104  CO2 25 25  GLUCOSE 116*  100*  BUN 19 18  CREATININE 1.43* 1.42*  CALCIUM 9.3 9.6  MG -- --  PHOS -- --   Liver Function Tests:  Lab 11/03/11 0440  AST 17  ALT 21  ALKPHOS 41  BILITOT 0.3  PROT 6.4  ALBUMIN 3.8   No results found for this basename: LIPASE:5,AMYLASE:5 in the last 168 hours No results found for this basename: AMMONIA:5 in the last 168 hours CBC:  Lab 11/02/11 2136  WBC 8.8  NEUTROABS --  HGB 13.3  HCT 38.4*  MCV 84.8  PLT 234   Cardiac Enzymes:  Lab  11/03/11 0028  CKTOTAL 168  CKMB 3.2  CKMBINDEX --  TROPONINI <0.30   BNP: No results found for this basename: POCBNP:5 in the last 168 hours CBG: No results found for this basename: GLUCAP:5 in the last 168 hours   Radiological Exams on Admission: Ct Head Wo Contrast  11/02/2011  *RADIOLOGY REPORT*  Clinical Data: Weakness and right arm tingling; confusion. Hypertension.  CT HEAD WITHOUT CONTRAST  Technique:  Contiguous axial images were obtained from the base of the skull through the vertex without contrast.  Comparison: None.  Findings: There is no evidence of acute infarction, mass lesion, or intra- or extra-axial hemorrhage on CT.  The posterior fossa, including the cerebellum, brainstem and fourth ventricle, is within normal limits.  The third and lateral ventricles, and basal ganglia are unremarkable in appearance.  The cerebral hemispheres are symmetric in appearance, with normal gray- white differentiation.  No mass effect or midline shift is seen.  There is no evidence of fracture; visualized osseous structures are unremarkable in appearance.  The visualized portions of the orbits are within normal limits.  The paranasal sinuses and mastoid air cells are well-aerated.  No significant soft tissue abnormalities are seen.  IMPRESSION: Unremarkable noncontrast CT of the head.  Original Report Authenticated By: Tonia Ghent, M.D.   Mri Brain Without Contrast  11/03/2011  *RADIOLOGY REPORT*  Clinical Data:  Episode of right-sided weakness 1 week ago.  Now expressive aphasia.  MRI HEAD WITHOUT CONTRAST MRA HEAD WITHOUT CONTRAST  Technique:  Multiplanar, multiecho pulse sequences of the brain and surrounding structures were obtained without intravenous contrast. Angiographic images of the head were obtained using MRA technique without contrast.  Comparison:  CT head without contrast 11/02/2011 at [facility].  MRI HEAD  Findings:  The diffusion weighted images demonstrate no evidence  for acute or subacute infarction.  No hemorrhage or mass lesion is present.  The ventricles are of normal size.  No significant extra-axial fluid collection is present.  Mild diffuse mucosal thickening is seen throughout the paranasal sinuses.  No air-fluid levels are present.  The mastoid air cells are clear.  IMPRESSION:  1.  Normal MRI appearance of the brain. 2.  Mild mucosal thickening throughout the sinuses.  MRA HEAD  Findings: There is an infundibulum of the left posterior communicating artery.  Minimal irregularity is present within the cavernous carotid arteries bilaterally.  There is signal loss at the left ICA terminus and within the proximal left A1 and M1 segments with flow in the more distal left middle cerebral artery. The MCA bifurcations are within normal limits.  There is decreased signal within the left MCA branches compared to the right, suggesting decreased flow.  Both A2 segments fill from the left. The anterior communicating artery is patent.  The right A1 segment is aplastic.  The vertebral arteries are codominant.  The right PICA origin is visualized  and normal.  The left AICA is the dominant.  The basilar artery is normal.  The left posterior cerebral artery originates from the basilar tip.  The right posterior cerebral artery is of fetal type with only a small P1 segment.  The PCA branch vessels are within normal limits bilaterally.  IMPRESSION:  1.  Signal loss within the terminal left internal carotid artery and proximal left M1 and A1 segments with more normal appearing flow distal.  This may represent an incomplete occlusion or significant flow disturbance.  Conventional angiography may be useful for further evaluation. 2.  Decreased signal intensity within the left MCA branch vessels. This suggests decreased flow to the left MCA vessels. 3.  There is some signal loss in the proximal left A1 segment as well, suggesting partial occlusion.  This takes on increased relevance as there is  no right A1 segment and both anterior cerebral arteries are fed from the left.  These results were called by telephone on 11/03/2011  at  8:15 am to  Fayrene Helper, PA in the CDU., who verbally acknowledged these results.  Original Report Authenticated By: Jamesetta Orleans. MATTERN, M.D.   Mr Maxine Glenn Head/brain Wo Cm  11/03/2011  *RADIOLOGY REPORT*  Clinical Data:  Episode of right-sided weakness 1 week ago.  Now expressive aphasia.  MRI HEAD WITHOUT CONTRAST MRA HEAD WITHOUT CONTRAST  Technique:  Multiplanar, multiecho pulse sequences of the brain and surrounding structures were obtained without intravenous contrast. Angiographic images of the head were obtained using MRA technique without contrast.  Comparison:  CT head without contrast 11/02/2011 at [facility].  MRI HEAD  Findings:  The diffusion weighted images demonstrate no evidence for acute or subacute infarction.  No hemorrhage or mass lesion is present.  The ventricles are of normal size.  No significant extra-axial fluid collection is present.  Mild diffuse mucosal thickening is seen throughout the paranasal sinuses.  No air-fluid levels are present.  The mastoid air cells are clear.  IMPRESSION:  1.  Normal MRI appearance of the brain. 2.  Mild mucosal thickening throughout the sinuses.  MRA HEAD  Findings: There is an infundibulum of the left posterior communicating artery.  Minimal irregularity is present within the cavernous carotid arteries bilaterally.  There is signal loss at the left ICA terminus and within the proximal left A1 and M1 segments with flow in the more distal left middle cerebral artery. The MCA bifurcations are within normal limits.  There is decreased signal within the left MCA branches compared to the right, suggesting decreased flow.  Both A2 segments fill from the left. The anterior communicating artery is patent.  The right A1 segment is aplastic.  The vertebral arteries are codominant.  The right PICA origin is visualized  and normal.  The left AICA is the dominant.  The basilar artery is normal.  The left posterior cerebral artery originates from the basilar tip.  The right posterior cerebral artery is of fetal type with only a small P1 segment.  The PCA branch vessels are within normal limits bilaterally.  IMPRESSION:  1.  Signal loss within the terminal left internal carotid artery and proximal left M1 and A1 segments with more normal appearing flow distal.  This may represent an incomplete occlusion or significant flow disturbance.  Conventional angiography may be useful for further evaluation. 2.  Decreased signal intensity within the left MCA branch vessels. This suggests decreased flow to the left MCA vessels. 3.  There is some  signal loss in the proximal left A1 segment as well, suggesting partial occlusion.  This takes on increased relevance as there is no right A1 segment and both anterior cerebral arteries are fed from the left.  These results were called by telephone on 11/03/2011  at  8:15 am to  Fayrene Helper, PA in the CDU., who verbally acknowledged these results.  Original Report Authenticated By: Jamesetta Orleans. MATTERN, M.D.    EKG: Independently reviewed. Poor quality. Sinus rhythm, no acute changes.  Procedures:  2-D echocardiogram: Pending  Bilateral carotid ultrasound: Preliminary report: No evidence of ICA stenosis.  Consultants:  Interventional radiology:  Neurology:  Vascular surgery:  Assessment/Plan 1. Left hemispheric TIAs: Management as per neurology. 2. High-grade stenosis of cerebral artery: Management as per neurology. Interventional radiology and vascular surgery consulted by neurology. 3. Chronic kidney disease stage III: Appears to be at baseline.  Code Status: Full Family Communication: Discussed with wife at bedside. Disposition Plan: Pending further investigation.  Brendia Sacks, MD  [facility] Pager [phone] 11/03/2011, 9:27 AM

## 2011-11-03 NOTE — Progress Notes (Signed)
ANTICOAGULATION CONSULT NOTE - Follow Up Consult  Pharmacy Consult for Heparin  Indication: Critical carotid stenosis  No Known Allergies  Patient Measurements: Height: 5\' 10"  (177.8 cm) Weight: 230 lb (104.327 kg) IBW/kg (Calculated) : 73  Adjusted Body Weight:   Vital Signs: Temp: 97.7 F (36.5 C) (11/29 1956) Temp src: Oral (11/29 1956) BP: 138/81 mmHg (11/29 1900) Pulse Rate: 61  (11/29 1900)  Labs:  Alvira Philips 11/03/11 1938 11/03/11 0440 11/03/11 0028 11/03/11 0027 11/02/11 2136  HGB -- -- -- -- 13.3  HCT -- -- -- -- 38.4*  PLT -- -- -- -- 234  APTT -- -- -- 35 --  LABPROT -- -- -- 14.4 --  INR -- -- -- 1.10 --  HEPARINUNFRC <0.10* -- -- -- --  CREATININE -- 1.43* -- -- 1.42*  CKTOTAL -- -- 168 -- --  CKMB -- -- 3.2 -- --  TROPONINI -- -- <0.30 -- --   Estimated Creatinine Clearance: 71.4 ml/min (by C-G formula based on Cr of 1.43).   Medications:  Prescriptions prior to admission  Medication Sig Dispense Refill  . amLODipine-olmesartan (AZOR) 5-40 MG per tablet Take 1 tablet by mouth daily.        Marland Kitchen aspirin EC 81 MG tablet Take 81 mg by mouth daily.        . Colesevelam HCl 3.75 G PACK Take 3.75 g by mouth every morning.        . fish oil-omega-3 fatty acids 1000 MG capsule Take 1 g by mouth daily.        Marland Kitchen pyridoxine (B-6) 200 MG tablet Take 200 mg by mouth daily.          Assessment: 72 yom with new onset weakness/numbness presented to the ED with critical carotid stenosis.  Pt is now s/p cerebral arteriogram. Pt with severe occlusive stenosis of L MCA. Noted heparin level undetectable. RN confirms that heparin infusion was off until ~8pm for procedure, then resumed per Dr.Devshwar's orders.   Goal of Therapy:  Heparin level 0.3-0.5 iu/ml   Plan:  1. Continue heparin at 1200 units/hr 2. Check heparin level at 0200  Maxden Naji K. Allena Katz, PharmD, BCPS.  Clinical Pharmacist Pager [phone]. 11/03/2011 8:35 PM

## 2011-11-03 NOTE — ED Notes (Signed)
Patient transported to MRI 

## 2011-11-03 NOTE — ED Notes (Signed)
Pt has been in interventional radiology. Report called to 3100 for adm to ICU bed 9 to [place], [place]

## 2011-11-03 NOTE — Progress Notes (Signed)
ANTICOAGULATION CONSULT NOTE - Initial Consult  Pharmacy Consult for heparin Indication: critical carotid stenosis  No Known Allergies  Patient Measurements: Height: 5\' 10"  (177.8 cm) Weight: 230 lb (104.327 kg) IBW/kg (Calculated) : 73   Vital Signs: Temp: 97.9 F (36.6 C) (11/29 1059) Temp src: Oral (11/29 1059) BP: 125/73 mmHg (11/29 1059) Pulse Rate: 61  (11/29 1059)  Labs:  Alvira Philips 11/03/11 0440 11/03/11 0028 11/03/11 0027 11/02/11 2136  HGB -- -- -- 13.3  HCT -- -- -- 38.4*  PLT -- -- -- 234  APTT -- -- 35 --  LABPROT -- -- 14.4 --  INR -- -- 1.10 --  HEPARINUNFRC -- -- -- --  CREATININE 1.43* -- -- 1.42*  CKTOTAL -- 168 -- --  CKMB -- 3.2 -- --  TROPONINI -- <0.30 -- --   Estimated Creatinine Clearance: 71.4 ml/min (by C-G formula based on Cr of 1.43).  Medical History: Past Medical History  Diagnosis Date  . Cancer   . Hypertension   . Sleep apnea   . High cholesterol   . Arthritis     Medications:  PTA meds pending  Assessment: 56 yom with new onset weakness/numbness presented to the ED with critical carotid stenosis. Starting IV heparin without bolus per neurology. Baseline labs WNL.   Goal of Therapy:  Heparin level 0.3-0.5   Plan:  Heparin drip 1200units/hr (46ml/hr) Check a 6 hour heparin level Daily heparin level and CBC  Kenyetta Wimbish, Drake Leach 11/03/2011,12:27 PM

## 2011-11-04 ENCOUNTER — Encounter (HOSPITAL_COMMUNITY)
Admission: EM | Disposition: A | Discharge status: Home / Self Care | Payer: Self-pay | Source: Home / Self Care | Attending: Family Medicine

## 2011-11-04 LAB — CBC
Hemoglobin: 12.9 g/dL — ABNORMAL LOW (ref 13.0–17.0)
MCH: 30.1 pg (ref 26.0–34.0)
MCHC: 35.3 g/dL (ref 30.0–36.0)
Platelets: 221 10*3/uL (ref 150–400)
RDW: 13.7 % (ref 11.5–15.5)

## 2011-11-04 LAB — HEMOGLOBIN A1C
Hgb A1c MFr Bld: 5.4 % (ref <5.7)
Mean Plasma Glucose: 108 mg/dL (ref <117)

## 2011-11-04 LAB — GLUCOSE, CAPILLARY: Glucose-Capillary: 109 mg/dL — ABNORMAL HIGH (ref 70–99)

## 2011-11-04 LAB — HEPARIN LEVEL (UNFRACTIONATED)
Heparin Unfractionated: <0.1 IU/mL — ABNORMAL LOW (ref 0.30–0.70)
Heparin Unfractionated: <0.1 IU/mL — ABNORMAL LOW (ref 0.30–0.70)

## 2011-11-04 LAB — MRSA PCR SCREENING: MRSA by PCR: NEGATIVE

## 2011-11-04 SURGERY — ENDARTERECTOMY, CAROTID
Anesthesia: General | Site: Neck | Laterality: Left

## 2011-11-04 MED ORDER — CLOPIDOGREL BISULFATE 75 MG PO TABS
75.0000 mg | ORAL_TABLET | Freq: Every day | ORAL | Status: DC
  Administered 2011-11-04 – 2011-11-05 (×2): 75 mg via ORAL
  Filled 2011-11-04 (×3): qty 1

## 2011-11-04 MED ORDER — SODIUM CHLORIDE 0.9 % IV SOLN
INTRAVENOUS | Status: DC
  Administered 2011-11-04: 16:00:00 via INTRAVENOUS
  Administered 2011-11-05: 70 mL/h via INTRAVENOUS

## 2011-11-04 MED ORDER — CLOPIDOGREL BISULFATE 75 MG PO TABS
75.0000 mg | ORAL_TABLET | Freq: Every day | ORAL | Status: DC
  Filled 2011-11-04: qty 1

## 2011-11-04 MED ORDER — HEPARIN SOD (PORCINE) IN D5W 100 UNIT/ML IV SOLN
1900.0000 [IU]/h | INTRAVENOUS | Status: DC
  Administered 2011-11-04: 1600 [IU]/h via INTRAVENOUS
  Filled 2011-11-04 (×3): qty 250

## 2011-11-04 MED ORDER — ASPIRIN EC 81 MG PO TBEC
81.0000 mg | DELAYED_RELEASE_TABLET | Freq: Every day | ORAL | Status: DC
  Administered 2011-11-05: 81 mg via ORAL
  Filled 2011-11-04 (×2): qty 1

## 2011-11-04 MED ORDER — LORAZEPAM 2 MG/ML IJ SOLN: 0.5000 mg | Freq: Four times a day (QID) | INTRAMUSCULAR | Status: DC | PRN

## 2011-11-04 NOTE — Progress Notes (Signed)
11/04/2011 [name] SPARKS Case Management Note [phone]       Utilization review completed.  

## 2011-11-04 NOTE — Progress Notes (Signed)
ANTICOAGULATION CONSULT NOTE - Follow Up Consult  Pharmacy Consult for Heparin  Indication: Critical carotid stenosis  No Known Allergies  Patient Measurements: Height: 5\' 10"  (177.8 cm) Weight: 230 lb (104.327 kg) IBW/kg (Calculated) : 73  Adjusted Body Weight:   Vital Signs: Temp: 97.3 F (36.3 C) (11/30 0300) Temp src: Oral (11/29 1956) BP: 120/65 mmHg (11/30 0200) Pulse Rate: 58  (11/30 0200)  Labs:  Alvira Philips 11/04/11 0258 11/03/11 1938 11/03/11 0440 11/03/11 0028 11/03/11 0027 11/02/11 2136  HGB 12.9* -- -- -- -- 13.3  HCT 36.5* -- -- -- -- 38.4*  PLT 221 -- -- -- -- 234  APTT -- -- -- -- 35 --  LABPROT -- -- -- -- 14.4 --  INR -- -- -- -- 1.10 --  HEPARINUNFRC <0.10* <0.10* -- -- -- --  CREATININE -- -- 1.43* -- -- 1.42*  CKTOTAL -- -- -- 168 -- --  CKMB -- -- -- 3.2 -- --  TROPONINI -- -- -- <0.30 -- --   Estimated Creatinine Clearance: 71.4 ml/min (by C-G formula based on Cr of 1.43).   Medications:  Prescriptions prior to admission  Medication Sig Dispense Refill  . amLODipine-olmesartan (AZOR) 5-40 MG per tablet Take 1 tablet by mouth daily.        Marland Kitchen aspirin EC 81 MG tablet Take 81 mg by mouth daily.        . Colesevelam HCl 3.75 G PACK Take 3.75 g by mouth every morning.        . fish oil-omega-3 fatty acids 1000 MG capsule Take 1 g by mouth daily.        Marland Kitchen pyridoxine (B-6) 200 MG tablet Take 200 mg by mouth daily.          Assessment: 68 yom with new onset weakness/numbness presented to the ED with critical carotid stenosis.  Pt is now s/p cerebral arteriogram. Pt with severe occlusive stenosis of L MCA. Noted heparin level undetectable.   Goal of Therapy:  Heparin level 0.3-0.5 iu/ml   Plan: Increase Heparin 1600 units/hr  F/U 8 hr level.  Eddie Candle 11/04/2011

## 2011-11-04 NOTE — Progress Notes (Signed)
ANTICOAGULATION CONSULT NOTE - Initial Consult  Pharmacy Consult for Heparin Indication: recurrent stroke symptoms, high grade L MCA stenosis    No Known Allergies  Patient Measurements: Height: 5\' 10"  (177.8 cm) Weight: 230 lb (104.327 kg) IBW/kg (Calculated) : 73  Heparin weight: 95.2 kg  Vital Signs: BP: 122/76 mmHg (11/30 0900) Pulse Rate: 57  (11/30 0800)  Labs:  Alvira Philips 11/04/11 0258 11/03/11 1938 11/03/11 0440 11/03/11 0028 11/03/11 0027 11/02/11 2136  HGB 12.9* -- -- -- -- 13.3  HCT 36.5* -- -- -- -- 38.4*  PLT 221 -- -- -- -- 234  APTT -- -- -- -- 35 --  LABPROT -- -- -- -- 14.4 --  INR -- -- -- -- 1.10 --  HEPARINUNFRC <0.10* <0.10* -- -- -- --  CREATININE -- -- 1.43* -- -- 1.42*  CKTOTAL -- -- -- 168 -- --  CKMB -- -- -- 3.2 -- --  TROPONINI -- -- -- <0.30 -- --   Estimated Creatinine Clearance: 71.4 ml/min (by C-G formula based on Cr of 1.43).  Medical History: Past Medical History  Diagnosis Date  . Renal cell carcinoma   . Hypertension   . Sleep apnea     CPAP  . High cholesterol   . Arthritis   . Shortness of breath     uses cpap  . Stroke 11/03/2011    TIA    Medications:  Scheduled:    . aspirin EC  81 mg Oral Daily  . clopidogrel  75 mg Oral Q breakfast  . DISCONTD: aspirin  325 mg Oral Daily  . DISCONTD: clopidogrel  75 mg Oral Q breakfast  . DISCONTD: heparin lock flush  500 Units Intravenous Once    Assessment: 55 yo M known to pharmacy from previous heparin dosing. Spoke with Annie Main and patient is having recurring stroke symptoms and to resume heparin. Last heparin rate was 1600 units/hr but was discontinued prior to obtaining a level.  Goal of Therapy:  heparin level 0.3 - 0.5   Plan:  1. Resume heparin at 1600 units/hr (16 ml/hr) - No bolus 2. Check heparin level 6 hrs after resumed 3. Daily heparin level and CBC  Carson, Shiann Kam Danielle 11/04/2011,3:19 PM

## 2011-11-04 NOTE — Progress Notes (Signed)
RN was in room charting when patient complained of right cheek/jaw numbness. Dr. Pearlean Brownie was notified. He stated to continue to monitor. Dr. Irene Limbo was also notified. Within minutes of getting off the phone. The patient could not speak and presented with right sided facial droop. Dr. Pearlean Brownie was called again and while on the phone the patient's symptoms resolved. Dr. Pearlean Brownie stated to keep the patient over night and restart heparin. RN put in an order for pharmacy to consult for heparin.   Patient and family are very alarmed by event. They had many questions regarding the plan of care. Annie Main was called and she spoke with family over the phone.

## 2011-11-04 NOTE — Progress Notes (Signed)
ANTICOAGULATION CONSULT NOTE - Initial Consult  Pharmacy Consult for Heparin Indication: recurrent stroke symptoms, high grade L MCA stenosis    No Known Allergies  Patient Measurements: Height: 5\' 10"  (177.8 cm) Weight: 230 lb (104.327 kg) IBW/kg (Calculated) : 73  Heparin weight: 95.2 kg  Vital Signs: Temp: 98.6 F (37 C) (11/30 1943) Temp src: Oral (11/30 1943) BP: 127/69 mmHg (11/30 2200) Pulse Rate: 53  (11/30 2200)  Labs:  Alvira Philips 11/04/11 2206 11/04/11 0258 11/03/11 1938 11/03/11 0440 11/03/11 0028 11/03/11 0027 11/02/11 2136  HGB -- 12.9* -- -- -- -- 13.3  HCT -- 36.5* -- -- -- -- 38.4*  PLT -- 221 -- -- -- -- 234  APTT -- -- -- -- -- 35 --  LABPROT -- -- -- -- -- 14.4 --  INR -- -- -- -- -- 1.10 --  HEPARINUNFRC <0.10* <0.10* <0.10* -- -- -- --  CREATININE -- -- -- 1.43* -- -- 1.42*  CKTOTAL -- -- -- -- 168 -- --  CKMB -- -- -- -- 3.2 -- --  TROPONINI -- -- -- -- <0.30 -- --   Estimated Creatinine Clearance: 71.4 ml/min (by C-G formula based on Cr of 1.43).  Medical History: Past Medical History  Diagnosis Date  . Renal cell carcinoma   . Hypertension   . Sleep apnea     CPAP  . High cholesterol   . Arthritis   . Shortness of breath     uses cpap  . Stroke 11/03/2011    TIA    Medications:  Scheduled:     . aspirin EC  81 mg Oral Daily  . clopidogrel  75 mg Oral Q breakfast  . DISCONTD: aspirin  325 mg Oral Daily  . DISCONTD: clopidogrel  75 mg Oral Q breakfast  . DISCONTD: heparin lock flush  500 Units Intravenous Once    Assessment: 55 yo M known to pharmacy from previous heparin dosing. Heparin level is suptherapeutic on 1600 units/hr. RN reports no issues with line and no overt bleeding. Stroke-like symptoms earlier have resolved per RN.   Goal of Therapy:  heparin level 0.3 - 0.5   Plan:  1. Increase heparin to 1900 units/hr (52ml/hr)- No bolus.  2. Check heparin level 6 hrs after rate change (will be daily level).    Fayne Norrie 11/04/2011,10:58 PM

## 2011-11-04 NOTE — ED Provider Notes (Signed)
Medical screening examination/treatment/procedure(s) were performed by non-physician practitioner and as supervising physician I was immediately available for consultation/collaboration. Devoria Albe, MD, Armando Gang   Ward Givens, MD 11/04/11 [phone]

## 2011-11-04 NOTE — Progress Notes (Signed)
Pt c/o of left foot 3rd toe pain. Toe is purple but able to move. Patient states it was not there at home so it must have occurred while in the hospital however he is unaware of how/when it happened. MD is aware.

## 2011-11-04 NOTE — Progress Notes (Signed)
Stroke Team Progress Note  SUBJECTIVE  [name] is a 55 y.o. male whose stroke symptoms occurred on Thanksgiving day.  He presented with symptoms of recurrent transient aphasia, confusion and extremity weakness . Symptoms have been waxing and waning. He is in neuro ICU post cerebral angiogram yesterday.  His wife is at the bedside. Overall he feels his condition is completely resolved. He is on IV heparin drip. He has vascular risk factors of hypertension, hyperlipidemia, obesity and sleep apnea.MR angiogram can set up a catheter angiogram yesterday showed 80-85% proximal left middle cerebral artery stenosis  OBJECTIVE Most recent Vital Signs: Temp: 97.3 F (36.3 C) (11/30 0300) BP: 123/67 mmHg (11/30 0800) Pulse Rate: 57  (11/30 0800) Respiratory Rate: 7 O2 Saturdation: 98%  Intake/Output from previous day: 11/29 0701 - 11/30 0700 In: 825 [I.V.:825] Out: 650 [Urine:650]  IV Fluid Intake:     . sodium chloride 75 mL/hr at 11/03/11 2007  . heparin 1,600 Units/hr (11/04/11 0800)  . lactated ringers    . DISCONTD: heparin     Diet:  Cardiac thin liquids  Activity:  Up with assistance  DVT Prophylaxis:  IV heparin  Studies: CBC    Component Value Date/Time   WBC 7.6 11/04/2011 0258   RBC 4.29 11/04/2011 0258   HGB 12.9* 11/04/2011 0258   HCT 36.5* 11/04/2011 0258   PLT 221 11/04/2011 0258   MCV 85.1 11/04/2011 0258   MCH 30.1 11/04/2011 0258   MCHC 35.3 11/04/2011 0258   RDW 13.7 11/04/2011 0258   CMP    Component Value Date/Time   NA 140 11/03/2011 0440   K 3.5 11/03/2011 0440   CL 105 11/03/2011 0440   CO2 25 11/03/2011 0440   GLUCOSE 116* 11/03/2011 0440   BUN 19 11/03/2011 0440   CREATININE 1.43* 11/03/2011 0440   CALCIUM 9.3 11/03/2011 0440   PROT 6.4 11/03/2011 0440   ALBUMIN 3.8 11/03/2011 0440   AST 17 11/03/2011 0440   ALT 21 11/03/2011 0440   ALKPHOS 41 11/03/2011 0440   BILITOT 0.3 11/03/2011 0440   GFRNONAA 54* 11/03/2011 0440   GFRAA  63* 11/03/2011 0440   COAGS Lab Results  Component Value Date   INR 1.10 11/03/2011   Lipid Panel    Component Value Date/Time   CHOL 216* 11/03/2011 0441   TRIG 712* 11/03/2011 0441   HDL 20* 11/03/2011 0441   CHOLHDL 10.8 11/03/2011 0441   VLDL UNABLE TO CALCULATE IF TRIGLYCERIDE OVER 400 mg/dL 16/09/9603 5409   LDLCALC UNABLE TO CALCULATE IF TRIGLYCERIDE OVER 400 mg/dL 81/19/1478 2956   OZHY8M  Lab Results  Component Value Date   HGBA1C 5.4 11/03/2011   Urine Drug Screen  No results found for this basename: labopia, cocainscrnur, labbenz, amphetmu, thcu, labbarb    Alcohol Level No results found for this basename: eth   CT of the brain  Unremarkable noncontrast CT of the head.  Angio  85% high grade stenosis of left MCA.  MRI of the brain    1.  Normal MRI appearance of the brain. 2.  Mild mucosal thickening throughout the sinuses.   MRA of the brain   1.  Signal loss within the terminal left internal carotid artery and proximal left M1 and A1 segments with more normal appearing flow distal.  This may represent an incomplete occlusion or significant flow disturbance.  Conventional angiography may be useful for further evaluation. 2.  Decreased signal intensity within the left MCA branch vessels. This suggests  decreased flow to the left MCA vessels. 3.  There is some signal loss in the proximal left A1 segment as well, suggesting partial occlusion.  This takes on increased relevance as there is no right A1 segment and both anterior cerebral arteries are fed from the left.    2D Echocardiogram  EF 55-60% with no source of embolus.   Carotid Doppler  Canceled. Angio performed.  CXR  07/05/11 no acute disease  EKG  normal sinus rhythm.   Physical Exam  Patient alert and oriented x 3 Heart rate regular. Breath sounds clear. Speech clear.  Neurological exam : AO x 3. No aphasia. No dysarthria. Extraoccular movements intact. Visual fields full. Face symmetric. Tongue midline.  Moves all extremities x 4. Strength normal. Coordination normal. Sensation intact..  ASSESSMENT [name] is a 55 y.o. male with high grade left MCA stenosis, secondary to atherosclerosis , on aspirin 325 mg orally every day and heparin for secondary stroke prevention.  Stroke risk factors:  hyperlipidemia, hypertension and obstructive sleep apnea hypertrigclyeridemia, obesity  Obesity  Body mass index is 33.00 kg/(m^2).  Hypertriglyceridemia - on welchol at home. Had been on tricor for years, then adverse sx precluded continuing.  Hospital day # 2  TREATMENT/PLAN Continue aspirin 81 mg orally every day and clopidogrel 75 mg orally every day together x 3 months then plavix alone for secondary stroke prevention. Patient considering stent versus medical management. Avoid heavy physical activity. Check TCD prior to discharge. ? Add lovaza. Outpatient nutrition counseling recommended.I had a long discussion with the patient and his wife with regards to the progress from high-grade intracranial stenosis and and risk reduction with aggressive medical therapy versus intracranial angioplasty and stenting. I discussed the results of the recent  SAMMPRIS trial which suggested higher rate of complications and stroke risk in patients who underwent  stenting. Hence I would recommend aggressive medical management at present time. If he has recurrent symptoms despite aggressive medical management may  Reconsider.Follow up with me in 3 months.  Joaquin Music, ANP-BC, GNP-BC [facility] Pager: [phone] 11/04/2011 8:23 AM  Dr. Delia Heady, Stroke Center Medical Director, has personally reviewed chart, pertinent data, examined the patient and developed the plan of care.

## 2011-11-04 NOTE — Progress Notes (Addendum)
PROGRESS NOTE  [name] WUJ:[id number] DOB: 1956-08-10 DOA: 11/02/2011 PCP: Warrick Parisian, MD  Brief narrative: 55 year old man with one week history of intermittent right arm numbness and clumsiness as well as difficulty comprehending speech. MRI of the brain was negative for acute event however MRA of the head was grossly abnormal. Patient was admitted for interventional radiology and vascular surgery evaluation as well as neurology consultation. He was started on IV heparin infusion.  Past medical history: Renal cell carcinoma, hypertension, obstructive sleep apnea.  Consultants:  Neurology:  Vascular surgery: There was no significant extracranial carotid artery occlusive disease and therefore no role for vascular surgery.  Interventional radiology: Consulted for arteriogram.  Pharmacy: Heparin infusion.  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  November 29: 2-D echocardiogram: Left ventricular ejection fraction 55-60%. Moderate diastolic dysfunction (grade 2).  Interim History: Underwent cerebral arteriogram yesterday. No issues overnight. Subjective: No recurrent neurologic symptoms. He does note bruising to his left third toe that occurred sometime yesterday.  Objective: Filed Vitals:   11/04/11 0700  BP: 110/75  Pulse: 53  Temp:   Resp: 25   Blood pressure 110/75, pulse 53, temperature 97.3 F (36.3 C), temperature source Oral, resp. rate 25, height 5\' 10"  (1.778 m), weight 104.327 kg (230 lb), SpO2 99.00%.   Intake/Output Summary (Last 24 hours) at 11/04/11 0734 Last data filed at 11/04/11 0600  Gross per 24 hour  Intake    825 ml  Output    650 ml  Net    175 ml    Exam: General: Well-appearing. Cardiovascular: Regular rate and rhythm. No murmur, rub or gallop. No lower extremity edema. Respiratory: Clear to auscultation bilaterally. No wheezes, rales or  rhonchi. Normal respiratory effort Neurologic: Exam appears unchanged. Musculoskeletal: The left third toe has bruising on the dorsal aspect.  Data Reviewed: Basic Metabolic Panel:  Lab 11/03/11 9562 11/02/11 2136  NA 140 140  K 3.5 4.1  CL 105 104  CO2 25 25  GLUCOSE 116* 100*  BUN 19 18  CREATININE 1.43* 1.42*  CALCIUM 9.3 9.6  MG -- --  PHOS -- --   Liver Function Tests:  Lab 11/03/11 0440  AST 17  ALT 21  ALKPHOS 41  BILITOT 0.3  PROT 6.4  ALBUMIN 3.8   CBC:  Lab 11/04/11 0258 11/02/11 2136  WBC 7.6 8.8  NEUTROABS -- --  HGB 12.9* 13.3  HCT 36.5* 38.4*  MCV 85.1 84.8  PLT 221 234   Cardiac Enzymes:  Lab 11/03/11 0028  CKTOTAL 168  CKMB 3.2  CKMBINDEX --  TROPONINI <0.30   Recent Results (from the past 240 hour(s))  MRSA PCR SCREENING     Status: Normal   Collection Time   11/04/11  4:03 AM      Component Value Range Status Comment   MRSA by PCR NEGATIVE  NEGATIVE  Final     Studies: Ct Head Wo Contrast  11/02/2011  *RADIOLOGY REPORT*  Clinical Data: Weakness and right arm tingling; confusion. Hypertension.  CT HEAD WITHOUT CONTRAST  Technique:  Contiguous axial images were obtained from the base of the skull through the vertex without contrast.  Comparison: None.  Findings: There is no evidence of acute infarction, mass lesion, or intra- or extra-axial hemorrhage on CT.  The posterior fossa, including the cerebellum, brainstem and fourth ventricle, is within normal limits.  The third and lateral ventricles, and basal ganglia are unremarkable in appearance.  The cerebral hemispheres are symmetric in appearance, with normal gray- white differentiation.  No mass effect or midline shift is seen.  There is no evidence of fracture; visualized osseous structures are unremarkable in appearance.  The visualized portions of the orbits are within normal limits.  The paranasal sinuses and mastoid air cells are well-aerated.  No significant soft tissue abnormalities  are seen.  IMPRESSION: Unremarkable noncontrast CT of the head.  Original Report Authenticated By: Tonia Ghent, M.D.   Mri Brain Without Contrast  11/03/2011  *RADIOLOGY REPORT*  Clinical Data:  Episode of right-sided weakness 1 week ago.  Now expressive aphasia.  MRI HEAD WITHOUT CONTRAST MRA HEAD WITHOUT CONTRAST  Technique:  Multiplanar, multiecho pulse sequences of the brain and surrounding structures were obtained without intravenous contrast. Angiographic images of the head were obtained using MRA technique without contrast.  Comparison:  CT head without contrast 11/02/2011 at [facility].  MRI HEAD  Findings:  The diffusion weighted images demonstrate no evidence for acute or subacute infarction.  No hemorrhage or mass lesion is present.  The ventricles are of normal size.  No significant extra-axial fluid collection is present.  Mild diffuse mucosal thickening is seen throughout the paranasal sinuses.  No air-fluid levels are present.  The mastoid air cells are clear.  IMPRESSION:  1.  Normal MRI appearance of the brain. 2.  Mild mucosal thickening throughout the sinuses.  MRA HEAD  Findings: There is an infundibulum of the left posterior communicating artery.  Minimal irregularity is present within the cavernous carotid arteries bilaterally.  There is signal loss at the left ICA terminus and within the proximal left A1 and M1 segments with flow in the more distal left middle cerebral artery. The MCA bifurcations are within normal limits.  There is decreased signal within the left MCA branches compared to the right, suggesting decreased flow.  Both A2 segments fill from the left. The anterior communicating artery is patent.  The right A1 segment is aplastic.  The vertebral arteries are codominant.  The right PICA origin is visualized and normal.  The left AICA is the dominant.  The basilar artery is normal.  The left posterior cerebral artery originates from the basilar tip.  The right  posterior cerebral artery is of fetal type with only a small P1 segment.  The PCA branch vessels are within normal limits bilaterally.  IMPRESSION:  1.  Signal loss within the terminal left internal carotid artery and proximal left M1 and A1 segments with more normal appearing flow distal.  This may represent an incomplete occlusion or significant flow disturbance.  Conventional angiography may be useful for further evaluation. 2.  Decreased signal intensity within the left MCA branch vessels. This suggests decreased flow to the left MCA vessels. 3.  There is some signal loss in the proximal left A1 segment as well, suggesting partial occlusion.  This takes on increased relevance as there is no right A1 segment and both anterior cerebral arteries are fed from the left.  These results were called by telephone on 11/03/2011  at  8:15 am to  Fayrene Helper, PA in the CDU., who verbally acknowledged these results.  Original Report Authenticated By: Jamesetta Orleans. MATTERN, M.D.   Mr Maxine Glenn Head/brain Wo Cm  11/03/2011  *RADIOLOGY REPORT*  Clinical Data:  Episode of right-sided weakness 1 week ago.  Now expressive aphasia.  MRI HEAD WITHOUT CONTRAST MRA HEAD WITHOUT CONTRAST  Technique:  Multiplanar, multiecho pulse sequences of the  brain and surrounding structures were obtained without intravenous contrast. Angiographic images of the head were obtained using MRA technique without contrast.  Comparison:  CT head without contrast 11/02/2011 at [facility].  MRI HEAD  Findings:  The diffusion weighted images demonstrate no evidence for acute or subacute infarction.  No hemorrhage or mass lesion is present.  The ventricles are of normal size.  No significant extra-axial fluid collection is present.  Mild diffuse mucosal thickening is seen throughout the paranasal sinuses.  No air-fluid levels are present.  The mastoid air cells are clear.  IMPRESSION:  1.  Normal MRI appearance of the brain. 2.  Mild mucosal  thickening throughout the sinuses.  MRA HEAD  Findings: There is an infundibulum of the left posterior communicating artery.  Minimal irregularity is present within the cavernous carotid arteries bilaterally.  There is signal loss at the left ICA terminus and within the proximal left A1 and M1 segments with flow in the more distal left middle cerebral artery. The MCA bifurcations are within normal limits.  There is decreased signal within the left MCA branches compared to the right, suggesting decreased flow.  Both A2 segments fill from the left. The anterior communicating artery is patent.  The right A1 segment is aplastic.  The vertebral arteries are codominant.  The right PICA origin is visualized and normal.  The left AICA is the dominant.  The basilar artery is normal.  The left posterior cerebral artery originates from the basilar tip.  The right posterior cerebral artery is of fetal type with only a small P1 segment.  The PCA branch vessels are within normal limits bilaterally.  IMPRESSION:  1.  Signal loss within the terminal left internal carotid artery and proximal left M1 and A1 segments with more normal appearing flow distal.  This may represent an incomplete occlusion or significant flow disturbance.  Conventional angiography may be useful for further evaluation. 2.  Decreased signal intensity within the left MCA branch vessels. This suggests decreased flow to the left MCA vessels. 3.  There is some signal loss in the proximal left A1 segment as well, suggesting partial occlusion.  This takes on increased relevance as there is no right A1 segment and both anterior cerebral arteries are fed from the left.  These results were called by telephone on 11/03/2011  at  8:15 am to  Fayrene Helper, PA in the CDU., who verbally acknowledged these results.  Original Report Authenticated By: Jamesetta Orleans. MATTERN, M.D.    Scheduled Meds:   . aspirin  325 mg Oral Daily  . heparin lock flush  500 Units  Intravenous Once   Continuous Infusions:   . sodium chloride 75 mL/hr at 11/03/11 2007  . heparin 16 mL/hr (11/04/11 0600)  . lactated ringers    . DISCONTD: heparin       Assessment/Plan: 1. Left hemispheric TIAs: Further evaluation and treatment per neurology and interventional radiology. Hemoglobin A1c 5.4 2. Partial occlusion left middle cerebral vessels: Further treatment per neurology and interventional radiology. 3. Hypertriglyceridemia/hyperlipidemia: Colesevelam. 4. Chronic kidney disease stage III: Stable. 5. History of renal cell carcinoma.  Code Status: Full  Disposition Plan: Home when cleared by neurology and interventional radiology.  Nurse notified me of recurrent symptoms this afternoon. Spoke with Dr. Pearlean Brownie who is award. He recommended IV heparin (no bolus) and continuing Plavix. He will see again in the morning.  Brendia Sacks, MD  [facility] Pager [phone] 11/04/2011, 7:34 AM  LOS: 2 days

## 2011-11-05 DIAGNOSIS — G40209 Localization-related (focal) (partial) symptomatic epilepsy and epileptic syndromes with complex partial seizures, not intractable, without status epilepticus: Secondary | ICD-10-CM | POA: Diagnosis not present

## 2011-11-05 DIAGNOSIS — G459 Transient cerebral ischemic attack, unspecified: Secondary | ICD-10-CM

## 2011-11-05 LAB — CBC
HCT: 36.7 % — ABNORMAL LOW (ref 39.0–52.0)
Hemoglobin: 12.7 g/dL — ABNORMAL LOW (ref 13.0–17.0)
MCH: 29.3 pg (ref 26.0–34.0)
MCHC: 34.6 g/dL (ref 30.0–36.0)
MCV: 84.8 fL (ref 78.0–100.0)
RBC: 4.33 MIL/uL (ref 4.22–5.81)

## 2011-11-05 LAB — GLUCOSE, CAPILLARY
Glucose-Capillary: 104 mg/dL — ABNORMAL HIGH (ref 70–99)
Glucose-Capillary: 94 mg/dL (ref 70–99)
Glucose-Capillary: 97 mg/dL (ref 70–99)

## 2011-11-05 MED ORDER — LEVETIRACETAM 250 MG PO TABS: 250.0000 mg | ORAL_TABLET | Freq: Two times a day (BID) | ORAL | Status: DC

## 2011-11-05 MED ORDER — LEVETIRACETAM ER 500 MG PO TB24
500.0000 mg | ORAL_TABLET | Freq: Every day | ORAL | Status: DC
  Administered 2011-11-05: 500 mg via ORAL
  Filled 2011-11-05: qty 1

## 2011-11-05 MED ORDER — HEPARIN SOD (PORCINE) IN D5W 100 UNIT/ML IV SOLN
2200.0000 [IU]/h | INTRAVENOUS | Status: DC
  Administered 2011-11-05: 2200 [IU]/h via INTRAVENOUS
  Filled 2011-11-05 (×2): qty 250

## 2011-11-05 MED ORDER — CLOPIDOGREL BISULFATE 75 MG PO TABS: 75.0000 mg | ORAL_TABLET | Freq: Every day | ORAL | Status: AC

## 2011-11-05 NOTE — Progress Notes (Signed)
Nurses note:  Pt and wife given discharge pamphlet and instructions gone through.  Discussed about new prescriptions ordered.  All questions answered.  Information given on stroke/TIA and seizure.  PIV removed, catheter intact-pt tolerated well.  Pt alert and oriented bp 126/72, 100% on RA .  Pt brought out to via wheelchair

## 2011-11-05 NOTE — Discharge Summary (Signed)
Patient ID: ARMAS MCBEE [id number] 55 y.o. [birth year]  Admit date: 11/02/2011  Discharge date and time: 11/05/2011, 12:01 PM  Admitting Physician: Pandora Leiter   Discharge Physician: Delia Heady  Admission Diagnoses: Transient ischemic attack [435.9] TIA  Discharge Diagnoses: 1. Recurrent left hemispheric TIAs secondary to high-grade symptomatic proximal left middle cerebral artery stenosis. #2 simple partial seizures. #3 hyperlipidemia. #4 obesity #5 hypertension Admission Condition: serious  Discharged Condition:  fair  Indication for Admission: TIA  Hospital Course:    Kindly see Adm H & P for details of presentation. He presented with recurrent symptoms of transient speech difficulty his right hand parasthesias which were fluctuating lasting 10-15 minutes. CT head on admission was unremarkable. MRI scan showed no acute infarct. MR angiogram of the brain showed focal area of high-grade stenosis in the proximal left middle cerebral artery. Patient underwent diagnostic catheter angiogram by Dr Corliss Skains which confirmed 80-85% proximal left middle cerebral artery stenosis. Cardiac echo showed normal ejection fraction. Lipid profile was significant for elevated triglycerides. Carotid ultrasound was not done as patient had cerebral angiogram. Patient was started on IV heparin drip because of fluctuating symptoms. He remained stable. He was initially started on aspirin. Plavix was added later. Initial plan was to discharge the patient on 11/04/11  but an hour after the heparin was discontinued  he was about to be discharged he complained of transient right facial parasthesias which gradually spread over a few minutes to involve the whole face and arm as well as he had transient speech symptoms. Hence discharge was cancelled and he was put back on IV heparin. He was kept in the intensive care unit and remained stable overnight. On the day of discharge he had no recurrent symptoms.  After his history was reviewed with his family it was obvious that in several of these episodes he had transient flitting parasthesias and speech difficulties and possibility of simple partial seizures was entertained hence he was started on Keppra 500 mg loading dose followed by 250 mg twice a day for presumed simple partial seizures. There was extensive discussion with the patient and his wife with the results of the SAMMPRIS Trial and treatment options for symptomatic intracranial stenosis, risk and benefits of angioplasty stenting versus maximum medical therapy was discussed at length. Patient and family had several questions which were answered. It was decided to try maximal medical therapy first but can in case of recurrent left hemispheric symptoms Lt MCA  stenting may be considered. Patient was clearly instructed to come back to the hospital in case he had recurrent symptoms lasting more than 10-15 minutes or new left hemispheric ischemic symptoms. He was advised followup with Dr. Pearlean Brownie in his office in 2 months and also have outpatient EEG and transcranial Doppler studies for followup. He was advised to follow up with primary care physician within a week. Consults: [facility]  Significant Diagnostic Studies:  As stated above Treatments: anticoagulation: ASA and Plavix  Discharge Exam:  Awake  Alert oriented x 3. Normal speech and language.eye movements full without nystagmus. Face symmetric. Tongue midline. Normal strength, tone, reflexes and coordination. Normal sensation. Gait deferred.   Disposition: home  Patient Instructions:  Current Discharge Medication List    CONTINUE these medications which have NOT CHANGED   Details  amLODipine-olmesartan (AZOR) 5-40 MG per tablet Take 1 tablet by mouth daily.      aspirin EC 81 MG tablet Take 81 mg by mouth daily.      Colesevelam HCl  3.75 G PACK Take 3.75 g by mouth every morning.      fish oil-omega-3 fatty acids 1000 MG capsule  Take 1 g by mouth daily.      pyridoxine (B-6) 200 MG tablet Take 200 mg by mouth daily.        Keppra 250 mg twice daily  LDL>100 - No If yes, discharged on statin? - No:  Afib? - No  If yes, discharged on Coumadin? - Yes Discharge stroke prophylaxis - Antiplatelet med: Aspirin - dose 81 mg and Plavix  75 mg  Rehab Referral Recommendation: PT - No:  OT - No:   Speech Therapy - No:    Activity: activity as tolerated and activity as tolerated and no driving for today Diet: cardiac diet Wound Care: none needed      11/05/2011 12:01 PM

## 2011-11-05 NOTE — Progress Notes (Signed)
Lab contacted twice once at 0530 and then again around 0600 about a heparin level scheduled for 0500 this morning. The patients heparin level has still not been drawn. Will pass on to oncoming nurse to follow up and contact lab once again.

## 2011-11-05 NOTE — Progress Notes (Addendum)
PROGRESS NOTE  [name] BJY:[id number] DOB: 19-Jan-1956 DOA: 11/02/2011 PCP: Warrick Parisian, MD  Brief narrative: 55 year old man with one week history of intermittent right arm numbness and clumsiness as well as difficulty comprehending speech. MRI of the brain was negative for acute event however MRA of the head was grossly abnormal. Patient was admitted for interventional radiology and vascular surgery evaluation as well as neurology consultation. He was started on IV heparin infusion.  Past medical history: Renal cell carcinoma, hypertension, obstructive sleep apnea.  Consultants:  Neurology  Vascular surgery: There was no significant extracranial carotid artery occlusive disease and therefore no role for vascular surgery.  Interventional radiology: Consulted for arteriogram.  Pharmacy: Heparin infusion.  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  November 29: 2-D echocardiogram: Left ventricular ejection fraction 55-60%. Moderate diastolic dysfunction (grade 2).  Interim History: Had recurrent neurologic symptoms yesterday. The case was discussed with Dr. Pearlean Brownie who recommended restarting heparin without bolus. He will follow up again with the patient today. Subjective: No recurrent neurologic symptoms.   Objective: Filed Vitals:   11/05/11 1000  BP: 132/75  Pulse:   Temp:   Resp: 15   Blood pressure 132/75, pulse 58, temperature 98.1 F (36.7 C), temperature source Oral, resp. rate 15, height 5\' 10"  (1.778 m), weight 109.2 kg (240 lb 11.9 oz), SpO2 99.00%.   Intake/Output Summary (Last 24 hours) at 11/05/11 1037 Last data filed at 11/05/11 1015  Gross per 24 hour  Intake 2255.41 ml  Output   1001 ml  Net 1254.41 ml    Exam: General: Well-appearing. Cardiovascular: Regular rate and rhythm. No murmur, rub or gallop. No lower extremity edema. Respiratory: Clear to  auscultation bilaterally. No wheezes, rales or rhonchi. Normal respiratory effort Neurologic: Exam appears unchanged.  Data Reviewed: Basic Metabolic Panel:  Lab 11/03/11 0865 11/02/11 2136  NA 140 140  K 3.5 4.1  CL 105 104  CO2 25 25  GLUCOSE 116* 100*  BUN 19 18  CREATININE 1.43* 1.42*  CALCIUM 9.3 9.6  MG -- --  PHOS -- --   Liver Function Tests:  Lab 11/03/11 0440  AST 17  ALT 21  ALKPHOS 41  BILITOT 0.3  PROT 6.4  ALBUMIN 3.8   CBC:  Lab 11/05/11 0500 11/04/11 0258 11/02/11 2136  WBC 8.3 7.6 8.8  NEUTROABS -- -- --  HGB 12.7* 12.9* 13.3  HCT 36.7* 36.5* 38.4*  MCV 84.8 85.1 84.8  PLT 198 221 234   Cardiac Enzymes:  Lab 11/03/11 0028  CKTOTAL 168  CKMB 3.2  CKMBINDEX --  TROPONINI <0.30   Scheduled Meds:    . aspirin EC  81 mg Oral Daily  . clopidogrel  75 mg Oral Q breakfast  . DISCONTD: clopidogrel  75 mg Oral Q breakfast  . DISCONTD: heparin lock flush  500 Units Intravenous Once   Continuous Infusions:    . sodium chloride 70 mL/hr at 11/05/11 1000  . heparin 2,200 Units/hr (11/05/11 1015)  . DISCONTD: heparin 19 mL/hr (11/05/11 0900)     Assessment/Plan: 1. Left hemispheric TIAs: Further evaluation and treatment per neurology and interventional radiology. Hemoglobin A1c 5.4 2. Partial occlusion left middle cerebral vessels: Further treatment per neurology and interventional radiology. 3. Hypertriglyceridemia/hyperlipidemia: Colesevelam on discharge. 4. Chronic kidney disease stage III: Stable. 5. History of renal cell carcinoma.  Primary issue is complex and neurologic in nature. Chronic medical issues all appear to be stable. Would  be appropriate transfer to neurology service. There is little to add from a medical perspective.  Case discussed with Dr. Pearlean Brownie. He believes the patient's symptoms may actually be secondary to complex partial seizures and he will be placing the patient on Keppra. He anticipates possible discharge later  today. He has kindly agreed to assume the care of this patient.  I will sign off but remain available if I can be of further assistance.  Code Status: Full  Brendia Sacks, MD  [facility] Pager [phone] 11/05/2011, 10:37 AM    LOS: 3 days

## 2011-11-05 NOTE — Progress Notes (Signed)
Stroke Team Progress Note  SUBJECTIVE  [name] was scheduled for discharge yesterday but had recurrent neurological symptoms and hence discharge was cancelled. He informs me today that he noticed numbness initially started in his right forehead which spread over a minute to involve the whole right face and than a minute later he had some trouble speaking and right arm weakness. He states he's had similar episodes in the past. His wife informs me that she witnessed one such episode when he had a distant look on his face and had was glassy eyed. This makes me wonder whether some of these episodes are simple partial seizures rather than TIAs. Hence I plan to start him on Keppra for presumed seizures and discharge him home on Keppra along with aspirin and Plavix.Dr. Irene Limbo requested that the [name] be transferred to stroke service since he was not comfortable discharging this [name] and [name]'s wife also wanted this.  OBJECTIVE Most recent Vital Signs: Temp: 98.1 F (36.7 C) (12/01 0800) Temp src: Oral (12/01 0800) BP: 148/79 mmHg (12/01 1100) Pulse Rate: 59  (12/01 1100) Respiratory Rate: 18 O2 Saturdation: 99%  CBG (last 3)   Basename 11/05/11 0347 11/05/11 0014 11/04/11 1941  GLUCAP 97 94 108*    Diet: Cardiac    Activity: Up with assistance  VTE Prophylaxis:  Iv heparin Studies: Results for orders placed during the hospital encounter of 11/02/11 (from the past 24 hour(s))  GLUCOSE, CAPILLARY     Status: Abnormal   Collection Time   11/04/11  4:27 PM      Component Value Range   Glucose-Capillary 117 (*) 70 - 99 (mg/dL)   Comment 1 Documented in Chart     Comment 2 Notify RN    GLUCOSE, CAPILLARY     Status: Abnormal   Collection Time   11/04/11  7:41 PM      Component Value Range   Glucose-Capillary 108 (*) 70 - 99 (mg/dL)   Comment 1 Notify RN     Comment 2 Documented in Chart    HEPARIN LEVEL (UNFRACTIONATED)     Status: Abnormal   Collection Time   11/04/11 10:06 PM       Component Value Range   Heparin Unfractionated <0.10 (*) 0.30 - 0.70 (IU/mL)  GLUCOSE, CAPILLARY     Status: Normal   Collection Time   11/05/11 12:14 AM      Component Value Range   Glucose-Capillary 94  70 - 99 (mg/dL)  GLUCOSE, CAPILLARY     Status: Normal   Collection Time   11/05/11  3:47 AM      Component Value Range   Glucose-Capillary 97  70 - 99 (mg/dL)  HEPARIN LEVEL (UNFRACTIONATED)     Status: Abnormal   Collection Time   11/05/11  5:00 AM      Component Value Range   Heparin Unfractionated 0.10 (*) 0.30 - 0.70 (IU/mL)  CBC     Status: Abnormal   Collection Time   11/05/11  5:00 AM      Component Value Range   WBC 8.3  4.0 - 10.5 (K/uL)   RBC 4.33  4.22 - 5.81 (MIL/uL)   Hemoglobin 12.7 (*) 13.0 - 17.0 (g/dL)   HCT 52.8 (*) 41.3 - 52.0 (%)   MCV 84.8  78.0 - 100.0 (fL)   MCH 29.3  26.0 - 34.0 (pg)   MCHC 34.6  30.0 - 36.0 (g/dL)   RDW 24.4  01.0 - 27.2 (%)   Platelets 198  150 - 400 (K/uL)     No results found.  Physical Exam:    [name] alert and oriented x 3 Heart rate regular. Breath sounds clear. Speech clear.  Neurological exam : AO x 3. No aphasia. No dysarthria. Extraoccular movements intact. Visual fields full. Face symmetric. Tongue midline. Moves all extremities x 4. Strength normal. Coordination normal. Sensation intact..     ASSESSMENT [name] is a 55 y.o. male with  high grade left MCA stenosis, secondary to atherosclerosis , on aspirin 325 mg and Plavix  orally every day and heparin for secondary stroke prevention.he has had several episodes of and lifting paresthesias and speech disturbance present question of simple partial seizures rather than TIAs   Hospital day # 3  TREATMENT/PLAN  Continue aspirin 81 mg orally every day and clopidogrel 75 mg orally every day together x 3 months then plavix alone for secondary stroke prevention. [name] considering stent versus medical management. Avoid heavy physical activity.  Outpatient  nutrition counseling recommended.I had a long discussion with the [name] and his wife with regards to the progress from high-grade intracranial stenosis and and risk reduction with aggressive medical therapy versus intracranial angioplasty and stenting. I discussed the results of the recent SAMMPRIS trial which suggested higher rate of complications and stroke risk in patients who underwent stenting. Hence I would recommend aggressive medical management at present time. If he has recurrent symptoms despite aggressive medical management may Reconsider.and Keppra 250 mg twice a day for simple partial seizures.Follow up with me in 2 months. Outpatient EEG and follow up transcranial Doppler studies. I have advised the [name] and wife to come to the emergency room in case he has a current neurological symptoms which do not clear within 10-15 minutes.     Gates Rigg, MD [facility] Pager: [phone] 11/05/2011 11:47 AM

## 2011-11-05 NOTE — Progress Notes (Signed)
ANTICOAGULATION CONSULT NOTE - Initial Consult  Pharmacy Consult for Heparin Indication: recurrent stroke symptoms, high grade L MCA stenosis    No Known Allergies  Patient Measurements: Height: 5\' 10"  (177.8 cm) Weight: 240 lb 11.9 oz (109.2 kg) IBW/kg (Calculated) : 73  Heparin weight: 95.2 kg  Vital Signs: Temp: 98.1 F (36.7 C) (12/01 0800) Temp src: Oral (12/01 0800) BP: 140/75 mmHg (12/01 0900) Pulse Rate: 58  (12/01 0800)  Labs:  Basename 11/05/11 0500 11/04/11 2206 11/04/11 0258 11/03/11 0440 11/03/11 0028 11/03/11 0027 11/02/11 2136  HGB 12.7* -- 12.9* -- -- -- --  HCT 36.7* -- 36.5* -- -- -- 38.4*  PLT 198 -- 221 -- -- -- 234  APTT -- -- -- -- -- 35 --  LABPROT -- -- -- -- -- 14.4 --  INR -- -- -- -- -- 1.10 --  HEPARINUNFRC 0.10* <0.10* <0.10* -- -- -- --  CREATININE -- -- -- 1.43* -- -- 1.42*  CKTOTAL -- -- -- -- 168 -- --  CKMB -- -- -- -- 3.2 -- --  TROPONINI -- -- -- -- <0.30 -- --   Estimated Creatinine Clearance: 73.1 ml/min (by C-G formula based on Cr of 1.43).  Medications:  Scheduled:     . aspirin EC  81 mg Oral Daily  . clopidogrel  75 mg Oral Q breakfast  . DISCONTD: clopidogrel  75 mg Oral Q breakfast  . DISCONTD: heparin lock flush  500 Units Intravenous Once    Assessment: 55 yo M on heparin for CVA and level is below goal.  Goal of Therapy:  heparin level 0.3 - 0.5   Plan:  1. Increase heparin to 2200 units/hr (53ml/hr)- No bolus.  2. Check heparin level 6 hrs after rate change  Benny Lennert 11/05/2011,10:15 AM

## 2012-01-10 ENCOUNTER — Other Ambulatory Visit: Payer: Self-pay | Admitting: Urology

## 2012-01-10 ENCOUNTER — Ambulatory Visit (HOSPITAL_COMMUNITY)
Admission: RE | Admit: 2012-01-10 | Discharge: 2012-01-10 | Disposition: A | Discharge status: Home / Self Care | Payer: Managed Care, Other (non HMO) | Source: Ambulatory Visit | Attending: Urology | Admitting: Urology

## 2012-01-10 DIAGNOSIS — C649 Malignant neoplasm of unspecified kidney, except renal pelvis: Secondary | ICD-10-CM | POA: Insufficient documentation

## 2012-01-10 DIAGNOSIS — I10 Essential (primary) hypertension: Secondary | ICD-10-CM | POA: Insufficient documentation

## 2012-07-19 ENCOUNTER — Ambulatory Visit (HOSPITAL_COMMUNITY)
Admission: RE | Admit: 2012-07-19 | Discharge: 2012-07-19 | Disposition: A | Discharge status: Home / Self Care | Payer: 59 | Source: Ambulatory Visit | Attending: Urology | Admitting: Urology

## 2012-07-19 ENCOUNTER — Other Ambulatory Visit: Payer: Self-pay | Admitting: Urology

## 2012-07-19 DIAGNOSIS — N529 Male erectile dysfunction, unspecified: Secondary | ICD-10-CM

## 2012-07-19 DIAGNOSIS — I10 Essential (primary) hypertension: Secondary | ICD-10-CM | POA: Insufficient documentation

## 2012-07-19 DIAGNOSIS — Z85528 Personal history of other malignant neoplasm of kidney: Secondary | ICD-10-CM | POA: Insufficient documentation

## 2012-07-20 MED ORDER — BACITRACIN-NEOMYCIN-POLYMYXIN 400-5-5000 EX OINT
TOPICAL_OINTMENT | CUTANEOUS | Status: AC
  Filled 2012-07-20: qty 1

## 2012-11-09 ENCOUNTER — Other Ambulatory Visit (HOSPITAL_COMMUNITY): Payer: Self-pay | Admitting: Orthopaedic Surgery

## 2012-11-12 ENCOUNTER — Encounter (HOSPITAL_COMMUNITY): Payer: Self-pay | Admitting: Pharmacy Technician

## 2012-11-13 ENCOUNTER — Encounter (HOSPITAL_COMMUNITY)
Admission: RE | Admit: 2012-11-13 | Discharge: 2012-11-13 | Disposition: A | Discharge status: Home / Self Care | Payer: 59 | Source: Ambulatory Visit | Attending: Orthopaedic Surgery | Admitting: Orthopaedic Surgery

## 2012-11-13 ENCOUNTER — Encounter (HOSPITAL_COMMUNITY): Payer: Self-pay

## 2012-11-13 HISTORY — DX: Major depressive disorder, single episode, unspecified: F32.9

## 2012-11-13 HISTORY — DX: Unspecified convulsions: R56.9

## 2012-11-13 HISTORY — DX: Depression, unspecified: F32.A

## 2012-11-13 LAB — APTT: aPTT: 33 seconds (ref 24–37)

## 2012-11-13 LAB — URINALYSIS, ROUTINE W REFLEX MICROSCOPIC
Bilirubin Urine: NEGATIVE
Hgb urine dipstick: NEGATIVE
Ketones, ur: NEGATIVE mg/dL
Protein, ur: NEGATIVE mg/dL
Urobilinogen, UA: 0.2 mg/dL (ref 0.0–1.0)

## 2012-11-13 LAB — BASIC METABOLIC PANEL
BUN: 21 mg/dL (ref 6–23)
Chloride: 105 mEq/L (ref 96–112)
Creatinine, Ser: 1.27 mg/dL (ref 0.50–1.35)
GFR calc Af Amer: 72 mL/min — ABNORMAL LOW (ref >90)
Glucose, Bld: 82 mg/dL (ref 70–99)

## 2012-11-13 LAB — PROTIME-INR
INR: 1 (ref 0.00–1.49)
Prothrombin Time: 13.1 seconds (ref 11.6–15.2)

## 2012-11-13 LAB — CBC
HCT: 37.8 % — ABNORMAL LOW (ref 39.0–52.0)
MCH: 28.8 pg (ref 26.0–34.0)
MCHC: 34.9 g/dL (ref 30.0–36.0)
MCV: 82.4 fL (ref 78.0–100.0)
RDW: 13.3 % (ref 11.5–15.5)

## 2012-11-13 LAB — SURGICAL PCR SCREEN: MRSA, PCR: NEGATIVE

## 2012-11-13 NOTE — Progress Notes (Addendum)
Chest x-ray 07/19/12 on [facility], carotid doppler 02/23/12 on [facility]

## 2012-11-13 NOTE — Patient Instructions (Signed)
20 ANDREU DRUDGE  11/13/2012   Your procedure is scheduled on: 11/16/12  Report to [facility] at [phone].  Call this number if you have problems the morning of surgery [phone]   Remember:please bring CPAP mask and tubing   Do not eat food or drink liquids After Midnight.     Take these medicines the morning of surgery with A SIP OF WATER: keppra, celexa, crestor   Do not wear jewelry, make-up or nail polish.  Do not wear lotions, powders, or perfumes. You may wear deodorant.  Do not shave 48 hours prior to surgery. Men may shave face and neck.  Do not bring valuables to the hospital.  Contacts, dentures or bridgework may not be worn into surgery.  Leave suitcase in the car. After surgery it may be brought to your room.  For patients admitted to the hospital, checkout time is 11:00 AM the day of discharge.    Special Instructions: Shower using CHG 2 nights before surgery and the night before surgery.  If you shower the day of surgery use CHG.  Use special wash - you have one bottle of CHG for all showers.  You should use approximately 1/3 of the bottle for each shower.   Please read over the following fact sheets that you were given: MRSA Information, blood fact sheet  Birdie Sons, RN  pre op nurse call if needed [phone]    FAILURE TO FOLLOW THESE INSTRUCTIONS MAY RESULT IN CANCELLATION OF YOUR SURGERY   Patient Signature: ___________________________________________

## 2012-11-16 ENCOUNTER — Encounter (HOSPITAL_COMMUNITY): Payer: Self-pay | Admitting: Anesthesiology

## 2012-11-16 ENCOUNTER — Encounter (HOSPITAL_COMMUNITY): Payer: Self-pay | Admitting: *Deleted

## 2012-11-16 ENCOUNTER — Encounter (HOSPITAL_COMMUNITY): Payer: Self-pay

## 2012-11-16 ENCOUNTER — Ambulatory Visit (HOSPITAL_COMMUNITY): Payer: 59 | Admitting: Anesthesiology

## 2012-11-16 ENCOUNTER — Ambulatory Visit (HOSPITAL_COMMUNITY): Payer: 59

## 2012-11-16 ENCOUNTER — Encounter (HOSPITAL_COMMUNITY)
Admission: RE | Disposition: A | Discharge status: Home / Self Care | Payer: Self-pay | Source: Ambulatory Visit | Attending: Orthopaedic Surgery

## 2012-11-16 ENCOUNTER — Inpatient Hospital Stay (HOSPITAL_COMMUNITY)
Admission: RE | Admit: 2012-11-16 | Discharge: 2012-11-21 | DRG: 470 | Disposition: A | Discharge status: Home / Self Care | Payer: 59 | Source: Ambulatory Visit | Attending: Orthopaedic Surgery | Admitting: Orthopaedic Surgery

## 2012-11-16 DIAGNOSIS — G40909 Epilepsy, unspecified, not intractable, without status epilepticus: Secondary | ICD-10-CM

## 2012-11-16 DIAGNOSIS — M1710 Unilateral primary osteoarthritis, unspecified knee: Principal | ICD-10-CM | POA: Diagnosis present

## 2012-11-16 DIAGNOSIS — G459 Transient cerebral ischemic attack, unspecified: Secondary | ICD-10-CM | POA: Diagnosis present

## 2012-11-16 DIAGNOSIS — Z8673 Personal history of transient ischemic attack (TIA), and cerebral infarction without residual deficits: Secondary | ICD-10-CM

## 2012-11-16 DIAGNOSIS — R52 Pain, unspecified: Secondary | ICD-10-CM

## 2012-11-16 DIAGNOSIS — R4701 Aphasia: Secondary | ICD-10-CM | POA: Diagnosis not present

## 2012-11-16 DIAGNOSIS — Z7902 Long term (current) use of antithrombotics/antiplatelets: Secondary | ICD-10-CM

## 2012-11-16 DIAGNOSIS — M25469 Effusion, unspecified knee: Secondary | ICD-10-CM | POA: Diagnosis present

## 2012-11-16 DIAGNOSIS — G4730 Sleep apnea, unspecified: Secondary | ICD-10-CM | POA: Diagnosis present

## 2012-11-16 DIAGNOSIS — K5900 Constipation, unspecified: Secondary | ICD-10-CM | POA: Diagnosis not present

## 2012-11-16 DIAGNOSIS — F3289 Other specified depressive episodes: Secondary | ICD-10-CM | POA: Diagnosis present

## 2012-11-16 DIAGNOSIS — E860 Dehydration: Secondary | ICD-10-CM | POA: Diagnosis not present

## 2012-11-16 DIAGNOSIS — I6609 Occlusion and stenosis of unspecified middle cerebral artery: Secondary | ICD-10-CM | POA: Diagnosis present

## 2012-11-16 DIAGNOSIS — R29898 Other symptoms and signs involving the musculoskeletal system: Secondary | ICD-10-CM | POA: Diagnosis not present

## 2012-11-16 DIAGNOSIS — E785 Hyperlipidemia, unspecified: Secondary | ICD-10-CM | POA: Diagnosis present

## 2012-11-16 DIAGNOSIS — Z87891 Personal history of nicotine dependence: Secondary | ICD-10-CM

## 2012-11-16 DIAGNOSIS — M1712 Unilateral primary osteoarthritis, left knee: Secondary | ICD-10-CM | POA: Diagnosis present

## 2012-11-16 DIAGNOSIS — F329 Major depressive disorder, single episode, unspecified: Secondary | ICD-10-CM | POA: Diagnosis present

## 2012-11-16 DIAGNOSIS — D62 Acute posthemorrhagic anemia: Secondary | ICD-10-CM | POA: Diagnosis not present

## 2012-11-16 DIAGNOSIS — G40109 Localization-related (focal) (partial) symptomatic epilepsy and epileptic syndromes with simple partial seizures, not intractable, without status epilepticus: Secondary | ICD-10-CM | POA: Diagnosis present

## 2012-11-16 DIAGNOSIS — I951 Orthostatic hypotension: Secondary | ICD-10-CM | POA: Diagnosis not present

## 2012-11-16 DIAGNOSIS — G40209 Localization-related (focal) (partial) symptomatic epilepsy and epileptic syndromes with complex partial seizures, not intractable, without status epilepticus: Secondary | ICD-10-CM

## 2012-11-16 DIAGNOSIS — N182 Chronic kidney disease, stage 2 (mild): Secondary | ICD-10-CM | POA: Diagnosis present

## 2012-11-16 DIAGNOSIS — I669 Occlusion and stenosis of unspecified cerebral artery: Secondary | ICD-10-CM | POA: Diagnosis not present

## 2012-11-16 DIAGNOSIS — I129 Hypertensive chronic kidney disease with stage 1 through stage 4 chronic kidney disease, or unspecified chronic kidney disease: Secondary | ICD-10-CM | POA: Diagnosis present

## 2012-11-16 HISTORY — PX: TOTAL KNEE ARTHROPLASTY: SHX125

## 2012-11-16 SURGERY — ARTHROPLASTY, KNEE, TOTAL
Anesthesia: Spinal | Site: Knee | Laterality: Left | Wound class: Clean

## 2012-11-16 MED ORDER — BUPIVACAINE IN DEXTROSE 0.75-8.25 % IT SOLN
INTRATHECAL | Status: DC | PRN
  Administered 2012-11-16: 2 mL via INTRATHECAL

## 2012-11-16 MED ORDER — ONDANSETRON HCL 4 MG PO TABS: 4.0000 mg | ORAL_TABLET | Freq: Four times a day (QID) | ORAL | Status: DC | PRN

## 2012-11-16 MED ORDER — AMLODIPINE BESYLATE 5 MG PO TABS
5.0000 mg | ORAL_TABLET | Freq: Every day | ORAL | Status: DC
  Administered 2012-11-17 – 2012-11-18 (×2): 5 mg via ORAL
  Filled 2012-11-16 (×3): qty 1

## 2012-11-16 MED ORDER — CEFAZOLIN SODIUM 1-5 GM-% IV SOLN
1.0000 g | Freq: Four times a day (QID) | INTRAVENOUS | Status: AC
  Administered 2012-11-16 (×2): 1 g via INTRAVENOUS
  Filled 2012-11-16 (×2): qty 50

## 2012-11-16 MED ORDER — DIPHENHYDRAMINE HCL 12.5 MG/5ML PO ELIX
12.5000 mg | ORAL_SOLUTION | ORAL | Status: DC | PRN
  Filled 2012-11-16: qty 10

## 2012-11-16 MED ORDER — ACETAMINOPHEN 10 MG/ML IV SOLN
INTRAVENOUS | Status: AC
  Filled 2012-11-16: qty 100

## 2012-11-16 MED ORDER — ASPIRIN EC 325 MG PO TBEC
325.0000 mg | DELAYED_RELEASE_TABLET | Freq: Every day | ORAL | Status: DC
  Administered 2012-11-17 – 2012-11-18 (×2): 325 mg via ORAL
  Filled 2012-11-16 (×2): qty 1

## 2012-11-16 MED ORDER — MEPERIDINE HCL 50 MG/ML IJ SOLN: 6.2500 mg | INTRAMUSCULAR | Status: DC | PRN

## 2012-11-16 MED ORDER — ACETAMINOPHEN 10 MG/ML IV SOLN
INTRAVENOUS | Status: DC | PRN
  Administered 2012-11-16: 1000 mg via INTRAVENOUS

## 2012-11-16 MED ORDER — ACETAMINOPHEN 325 MG PO TABS: 650.0000 mg | ORAL_TABLET | Freq: Four times a day (QID) | ORAL | Status: DC | PRN

## 2012-11-16 MED ORDER — METOCLOPRAMIDE HCL 5 MG/ML IJ SOLN
5.0000 mg | Freq: Three times a day (TID) | INTRAMUSCULAR | Status: DC | PRN
  Filled 2012-11-16: qty 2

## 2012-11-16 MED ORDER — METOCLOPRAMIDE HCL 10 MG PO TABS
5.0000 mg | ORAL_TABLET | Freq: Three times a day (TID) | ORAL | Status: DC | PRN
  Filled 2012-11-16: qty 1

## 2012-11-16 MED ORDER — ONDANSETRON HCL 4 MG/2ML IJ SOLN
INTRAMUSCULAR | Status: DC | PRN
  Administered 2012-11-16: 4 mg via INTRAVENOUS

## 2012-11-16 MED ORDER — ALUM & MAG HYDROXIDE-SIMETH 200-200-20 MG/5ML PO SUSP: 30.0000 mL | ORAL | Status: DC | PRN

## 2012-11-16 MED ORDER — AMLODIPINE BESYLATE 5 MG PO TABS
5.0000 mg | ORAL_TABLET | Freq: Once | ORAL | Status: AC
  Administered 2012-11-16: 5 mg via ORAL
  Filled 2012-11-16: qty 1

## 2012-11-16 MED ORDER — 0.9 % SODIUM CHLORIDE (POUR BTL) OPTIME
TOPICAL | Status: DC | PRN
  Administered 2012-11-16: 1000 mL

## 2012-11-16 MED ORDER — CEFAZOLIN SODIUM-DEXTROSE 2-3 GM-% IV SOLR
INTRAVENOUS | Status: AC
  Filled 2012-11-16: qty 50

## 2012-11-16 MED ORDER — HYDROMORPHONE HCL PF 1 MG/ML IJ SOLN
1.0000 mg | INTRAMUSCULAR | Status: DC | PRN
  Administered 2012-11-16 – 2012-11-17 (×6): 1 mg via INTRAVENOUS
  Filled 2012-11-16 (×8): qty 1

## 2012-11-16 MED ORDER — METHOCARBAMOL 500 MG PO TABS
500.0000 mg | ORAL_TABLET | Freq: Four times a day (QID) | ORAL | Status: DC | PRN
  Administered 2012-11-16 – 2012-11-19 (×8): 500 mg via ORAL
  Filled 2012-11-16 (×9): qty 1

## 2012-11-16 MED ORDER — ENOXAPARIN SODIUM 30 MG/0.3ML ~~LOC~~ SOLN
30.0000 mg | Freq: Two times a day (BID) | SUBCUTANEOUS | Status: DC
  Filled 2012-11-16: qty 0.3

## 2012-11-16 MED ORDER — KETAMINE HCL 10 MG/ML IJ SOLN
INTRAMUSCULAR | Status: DC | PRN
  Administered 2012-11-16 (×9): 10 mg via INTRAVENOUS

## 2012-11-16 MED ORDER — LACTATED RINGERS IV SOLN: INTRAVENOUS | Status: DC

## 2012-11-16 MED ORDER — ONDANSETRON HCL 4 MG/2ML IJ SOLN: 4.0000 mg | Freq: Four times a day (QID) | INTRAMUSCULAR | Status: DC | PRN

## 2012-11-16 MED ORDER — PHENOL 1.4 % MT LIQD: 1.0000 | OROMUCOSAL | Status: DC | PRN

## 2012-11-16 MED ORDER — ZOLPIDEM TARTRATE 5 MG PO TABS
5.0000 mg | ORAL_TABLET | Freq: Every evening | ORAL | Status: DC | PRN
  Administered 2012-11-20: 5 mg via ORAL
  Filled 2012-11-16: qty 1

## 2012-11-16 MED ORDER — LEVETIRACETAM 250 MG PO TABS
250.0000 mg | ORAL_TABLET | Freq: Two times a day (BID) | ORAL | Status: DC
  Administered 2012-11-16 – 2012-11-21 (×10): 250 mg via ORAL
  Filled 2012-11-16 (×12): qty 1

## 2012-11-16 MED ORDER — LACTATED RINGERS IV SOLN
INTRAVENOUS | Status: DC | PRN
  Administered 2012-11-16 (×3): via INTRAVENOUS

## 2012-11-16 MED ORDER — SODIUM CHLORIDE 0.9 % IV SOLN
INTRAVENOUS | Status: DC
  Administered 2012-11-16: 1000 mL via INTRAVENOUS
  Administered 2012-11-16: via INTRAVENOUS

## 2012-11-16 MED ORDER — ASPIRIN EC 325 MG PO TBEC: 325.0000 mg | DELAYED_RELEASE_TABLET | Freq: Every day | ORAL | Status: DC

## 2012-11-16 MED ORDER — CITALOPRAM HYDROBROMIDE 10 MG PO TABS
10.0000 mg | ORAL_TABLET | Freq: Every morning | ORAL | Status: DC
  Administered 2012-11-17 – 2012-11-21 (×5): 10 mg via ORAL
  Filled 2012-11-16 (×5): qty 1

## 2012-11-16 MED ORDER — STERILE WATER FOR IRRIGATION IR SOLN
Status: DC | PRN
  Administered 2012-11-16: 3000 mL

## 2012-11-16 MED ORDER — METHOCARBAMOL 100 MG/ML IJ SOLN
500.0000 mg | Freq: Four times a day (QID) | INTRAVENOUS | Status: DC | PRN
  Administered 2012-11-16: 500 mg via INTRAVENOUS
  Filled 2012-11-16: qty 5

## 2012-11-16 MED ORDER — CLOPIDOGREL BISULFATE 75 MG PO TABS
75.0000 mg | ORAL_TABLET | Freq: Every day | ORAL | Status: DC
  Filled 2012-11-16: qty 1

## 2012-11-16 MED ORDER — FENTANYL CITRATE 0.05 MG/ML IJ SOLN
INTRAMUSCULAR | Status: DC | PRN
  Administered 2012-11-16 (×4): 12.5 ug via INTRAVENOUS
  Administered 2012-11-16: 50 ug via INTRAVENOUS

## 2012-11-16 MED ORDER — MENTHOL 3 MG MT LOZG
1.0000 | LOZENGE | OROMUCOSAL | Status: DC | PRN
  Filled 2012-11-16: qty 9

## 2012-11-16 MED ORDER — ATORVASTATIN CALCIUM 80 MG PO TABS
80.0000 mg | ORAL_TABLET | Freq: Every day | ORAL | Status: DC
  Administered 2012-11-17 – 2012-11-20 (×4): 80 mg via ORAL
  Filled 2012-11-16 (×8): qty 1

## 2012-11-16 MED ORDER — CEFAZOLIN SODIUM-DEXTROSE 2-3 GM-% IV SOLR
2.0000 g | INTRAVENOUS | Status: AC
  Administered 2012-11-16: 2 g via INTRAVENOUS

## 2012-11-16 MED ORDER — DOCUSATE SODIUM 100 MG PO CAPS
100.0000 mg | ORAL_CAPSULE | Freq: Two times a day (BID) | ORAL | Status: DC
  Administered 2012-11-16 – 2012-11-21 (×10): 100 mg via ORAL
  Filled 2012-11-16 (×8): qty 1

## 2012-11-16 MED ORDER — MIDAZOLAM HCL 5 MG/5ML IJ SOLN
INTRAMUSCULAR | Status: DC | PRN
  Administered 2012-11-16: 2 mg via INTRAVENOUS

## 2012-11-16 MED ORDER — SODIUM CHLORIDE 0.9 % IR SOLN
Status: DC | PRN
  Administered 2012-11-16: 3000 mL

## 2012-11-16 MED ORDER — PROPOFOL 10 MG/ML IV EMUL
INTRAVENOUS | Status: DC | PRN
  Administered 2012-11-16: 100 ug/kg/min via INTRAVENOUS

## 2012-11-16 MED ORDER — OXYCODONE HCL ER 10 MG PO T12A
20.0000 mg | EXTENDED_RELEASE_TABLET | Freq: Two times a day (BID) | ORAL | Status: DC
  Administered 2012-11-16 – 2012-11-21 (×10): 20 mg via ORAL
  Filled 2012-11-16: qty 2
  Filled 2012-11-16 (×2): qty 1
  Filled 2012-11-16: qty 2
  Filled 2012-11-16: qty 1
  Filled 2012-11-16: qty 2
  Filled 2012-11-16: qty 1
  Filled 2012-11-16: qty 2
  Filled 2012-11-16 (×2): qty 1

## 2012-11-16 MED ORDER — ACETAMINOPHEN 650 MG RE SUPP: 650.0000 mg | Freq: Four times a day (QID) | RECTAL | Status: DC | PRN

## 2012-11-16 MED ORDER — OXYCODONE HCL 5 MG PO TABS
5.0000 mg | ORAL_TABLET | ORAL | Status: DC | PRN
  Administered 2012-11-16: 5 mg via ORAL
  Administered 2012-11-16 – 2012-11-18 (×13): 10 mg via ORAL
  Administered 2012-11-19 – 2012-11-21 (×10): 5 mg via ORAL
  Filled 2012-11-16 (×3): qty 2
  Filled 2012-11-16 (×2): qty 1
  Filled 2012-11-16 (×2): qty 2
  Filled 2012-11-16 (×3): qty 1
  Filled 2012-11-16 (×7): qty 2
  Filled 2012-11-16 (×3): qty 1
  Filled 2012-11-16 (×2): qty 2
  Filled 2012-11-16 (×2): qty 1

## 2012-11-16 MED ORDER — IRBESARTAN 300 MG PO TABS
300.0000 mg | ORAL_TABLET | Freq: Every day | ORAL | Status: DC
  Administered 2012-11-16 – 2012-11-18 (×3): 300 mg via ORAL
  Filled 2012-11-16 (×3): qty 1

## 2012-11-16 MED ORDER — CLOPIDOGREL BISULFATE 75 MG PO TABS
75.0000 mg | ORAL_TABLET | Freq: Every day | ORAL | Status: DC
  Administered 2012-11-17 – 2012-11-21 (×5): 75 mg via ORAL
  Filled 2012-11-16 (×7): qty 1

## 2012-11-16 MED ORDER — HYDROMORPHONE HCL PF 1 MG/ML IJ SOLN: 0.2500 mg | INTRAMUSCULAR | Status: DC | PRN

## 2012-11-16 MED ORDER — AMLODIPINE-OLMESARTAN 5-40 MG PO TABS: 1.0000 | ORAL_TABLET | Freq: Every morning | ORAL | Status: DC

## 2012-11-16 MED ORDER — PROMETHAZINE HCL 25 MG/ML IJ SOLN: 6.2500 mg | INTRAMUSCULAR | Status: DC | PRN

## 2012-11-16 SURGICAL SUPPLY — 57 items
BAG ZIPLOCK 12X15 (MISCELLANEOUS) ×2 IMPLANT
BANDAGE ELASTIC 6 VELCRO ST LF (GAUZE/BANDAGES/DRESSINGS) ×2 IMPLANT
BANDAGE ESMARK 6X9 LF (GAUZE/BANDAGES/DRESSINGS) ×1 IMPLANT
BLADE SAG 18X100X1.27 (BLADE) ×2 IMPLANT
BLADE SAW SGTL 13.0X1.19X90.0M (BLADE) ×2 IMPLANT
BNDG ESMARK 6X9 LF (GAUZE/BANDAGES/DRESSINGS) ×2
BOWL SMART MIX CTS (DISPOSABLE) ×2 IMPLANT
CEMENT BONE 1-PACK (Cement) ×6 IMPLANT
CLOTH BEACON ORANGE TIMEOUT ST (SAFETY) ×2 IMPLANT
CUFF TOURN SGL QUICK 34 (TOURNIQUET CUFF) ×1
CUFF TRNQT CYL 34X4X40X1 (TOURNIQUET CUFF) ×1 IMPLANT
DRAPE EXTREMITY T 121X128X90 (DRAPE) ×2 IMPLANT
DRAPE LG THREE QUARTER DISP (DRAPES) ×2 IMPLANT
DRAPE POUCH INSTRU U-SHP 10X18 (DRAPES) ×2 IMPLANT
DRAPE U-SHAPE 47X51 STRL (DRAPES) ×2 IMPLANT
DRSG PAD ABDOMINAL 8X10 ST (GAUZE/BANDAGES/DRESSINGS) ×2 IMPLANT
DURAPREP 26ML APPLICATOR (WOUND CARE) ×2 IMPLANT
ELECT REM PT RETURN 9FT ADLT (ELECTROSURGICAL) ×2
ELECTRODE REM PT RTRN 9FT ADLT (ELECTROSURGICAL) ×1 IMPLANT
EVACUATOR 1/8 PVC DRAIN (DRAIN) ×2 IMPLANT
FACESHIELD LNG OPTICON STERILE (SAFETY) ×10 IMPLANT
GAUZE XEROFORM 1X8 LF (GAUZE/BANDAGES/DRESSINGS) ×2 IMPLANT
GAUZE XEROFORM 5X9 LF (GAUZE/BANDAGES/DRESSINGS) ×2 IMPLANT
GLOVE BIO SURGEON STRL SZ7.5 (GLOVE) ×4 IMPLANT
GLOVE BIOGEL PI IND STRL 7.5 (GLOVE) ×1 IMPLANT
GLOVE BIOGEL PI IND STRL 8 (GLOVE) ×1 IMPLANT
GLOVE BIOGEL PI INDICATOR 7.5 (GLOVE) ×1
GLOVE BIOGEL PI INDICATOR 8 (GLOVE) ×1
GLOVE ECLIPSE 7.0 STRL STRAW (GLOVE) ×2 IMPLANT
GOWN STRL REIN XL XLG (GOWN DISPOSABLE) ×4 IMPLANT
HANDPIECE INTERPULSE COAX TIP (DISPOSABLE) ×1
IMMOBILIZER KNEE 20 (SOFTGOODS) ×2
IMMOBILIZER KNEE 20 THIGH 36 (SOFTGOODS) ×1 IMPLANT
KIT BASIN OR (CUSTOM PROCEDURE TRAY) ×2 IMPLANT
NS IRRIG 1000ML POUR BTL (IV SOLUTION) ×2 IMPLANT
PACK TOTAL JOINT (CUSTOM PROCEDURE TRAY) ×2 IMPLANT
PAD ABD 7.5X8 STRL (GAUZE/BANDAGES/DRESSINGS) ×2 IMPLANT
PADDING CAST COTTON 6X4 STRL (CAST SUPPLIES) ×2 IMPLANT
POSITIONER SURGICAL ARM (MISCELLANEOUS) ×2 IMPLANT
SET HNDPC FAN SPRY TIP SCT (DISPOSABLE) ×1 IMPLANT
SET PAD KNEE POSITIONER (MISCELLANEOUS) ×2 IMPLANT
SPONGE GAUZE 4X4 12PLY (GAUZE/BANDAGES/DRESSINGS) ×2 IMPLANT
SPONGE LAP 18X18 X RAY DECT (DISPOSABLE) ×4 IMPLANT
STAPLER VISISTAT 35W (STAPLE) ×2 IMPLANT
SUCTION FRAZIER 12FR DISP (SUCTIONS) ×2 IMPLANT
SUT VIC AB 0 CT1 27 (SUTURE)
SUT VIC AB 0 CT1 27XBRD ANTBC (SUTURE) IMPLANT
SUT VIC AB 1 CT1 27 (SUTURE) ×2
SUT VIC AB 1 CT1 27XBRD ANTBC (SUTURE) ×2 IMPLANT
SUT VIC AB 2-0 CT1 27 (SUTURE) ×2
SUT VIC AB 2-0 CT1 TAPERPNT 27 (SUTURE) ×2 IMPLANT
SUT VLOC 180 0 24IN GS25 (SUTURE) ×2 IMPLANT
TOWEL OR 17X26 10 PK STRL BLUE (TOWEL DISPOSABLE) ×4 IMPLANT
TOWEL OR NON WOVEN STRL DISP B (DISPOSABLE) ×2 IMPLANT
TRAY FOLEY CATH 14FRSI W/METER (CATHETERS) ×2 IMPLANT
WATER STERILE IRR 1500ML POUR (IV SOLUTION) ×2 IMPLANT
WRAP KNEE MAXI GEL POST OP (GAUZE/BANDAGES/DRESSINGS) ×2 IMPLANT

## 2012-11-16 NOTE — H&P (Signed)
TOTAL KNEE ADMISSION H&P  Patient is being admitted for left total knee arthroplasty.  Subjective:  Chief Complaint:left knee pain.  HPI: [name], 56 y.o. male, has a history of pain and functional disability in the left knee due to arthritis and has failed non-surgical conservative treatments for greater than 12 weeks to includeNSAID's and/or analgesics, corticosteriod injections, flexibility and strengthening excercises and activity modification.  Onset of symptoms was gradual, starting >10 years ago with gradually worsening course since that time. The patient noted prior procedures on the knee to include  ACL reconstruction on the left knee(s).  Patient currently rates pain in the left knee(s) at 7 out of 10 with activity. Patient has night pain, worsening of pain with activity and weight bearing, pain that interferes with activities of daily living, pain with passive range of motion and crepitus.  Patient has evidence of subchondral cysts, subchondral sclerosis, periarticular osteophytes and joint space narrowing by imaging studies. There is no active infection.  Patient Active Problem List   Diagnosis Date Noted  . Arthritis of knee, left 11/16/2012  . Localization-related (focal) (partial) epilepsy and epileptic syndromes with complex partial seizures, without mention of intractable epilepsy 11/05/2011  . Transient ischemic attack 11/05/2011  . Middle cerebral artery stenosis 11/03/2011   Past Medical History  Diagnosis Date  . Renal cell carcinoma   . Hypertension   . Sleep apnea     CPAP  . High cholesterol   . Arthritis   . Shortness of breath     uses cpap  . Stroke 11/03/2011    TIA  . Depression   . Seizures     Past Surgical History  Procedure Date  . Nephrectomy   . Carpal tunnel release   . Knee arthroscopy   . Anterior cruciate ligament repair   . Biceps tendon repair     Prescriptions prior to admission  Medication Sig Dispense Refill  .  amLODipine-olmesartan (AZOR) 5-40 MG per tablet Take 1 tablet by mouth every morning.       . citalopram (CELEXA) 10 MG tablet Take 10 mg by mouth every morning.      . Coenzyme Q10 (CO Q-10) 200 MG CAPS Take 200 mg by mouth.      . levETIRAcetam (KEPPRA) 250 MG tablet Take 250 mg by mouth 2 (two) times daily.      . rosuvastatin (CRESTOR) 40 MG tablet Take 40 mg by mouth every morning.      . zolpidem (AMBIEN) 5 MG tablet Take 5 mg by mouth at bedtime as needed.      . clopidogrel (PLAVIX) 75 MG tablet Take 75 mg by mouth daily.      . Colesevelam HCl 3.75 G PACK Take 3.75 g by mouth at bedtime.       . fish oil-omega-3 fatty acids 1000 MG capsule Take 1 g by mouth daily.        Marland Kitchen pyridoxine (B-6) 200 MG tablet Take 200 mg by mouth daily.        No Known Allergies  History  Substance Use Topics  . Smoking status: Former Smoker -- 2 years    Types: Cigarettes    Quit date: 12/05/1988  . Smokeless tobacco: Never Used     Comment: QUIT SMOKING OVER 35 YEAR'S AGO  ( 1970 'S)  . Alcohol Use: 0.0 oz/week     Comment: socially    Family History  Problem Relation Age of Onset  . Coronary artery disease  Father      Review of Systems  Musculoskeletal: Positive for joint pain.  All other systems reviewed and are negative.    Objective:  Physical Exam  Constitutional: He is oriented to person, place, and time. He appears well-developed and well-nourished.  HENT:  Head: Normocephalic and atraumatic.  Eyes: EOM are normal. Pupils are equal, round, and reactive to light.  Neck: Normal range of motion. Neck supple.  Cardiovascular: Normal rate and regular rhythm.   Respiratory: Effort normal and breath sounds normal.  GI: Soft. Bowel sounds are normal.  Musculoskeletal:       Left knee: He exhibits decreased range of motion, swelling and abnormal alignment. tenderness found. Medial joint line and lateral joint line tenderness noted.  Neurological: He is alert and oriented to person,  place, and time.  Skin: Skin is warm and dry.  Psychiatric: He has a normal mood and affect.    Vital signs in last 24 hours: Temp:  [97.6 F (36.4 C)] 97.6 F (36.4 C) (12/13 0526) Pulse Rate:  [57] 57  (12/13 0526) Resp:  [16] 16  (12/13 0526) BP: (136)/(75) 136/75 mmHg (12/13 0526) SpO2:  [100 %] 100 % (12/13 0526)  Labs:   Estimated Body mass index is 34.54 kg/(m^2) as calculated from the following:   Height as of 11/02/11: 5\' 10" (1.778 m).   Weight as of 11/02/11: 240 lb 11.9 oz(109.2 kg).   Imaging Review Plain radiographs demonstrate severe degenerative joint disease of the left knee(s). The overall alignment ismild varus. The bone quality appears to be good for age and reported activity level.  Assessment/Plan:  End stage arthritis, left knee   The patient history, physical examination, clinical judgment of the provider and imaging studies are consistent with end stage degenerative joint disease of the left knee(s) and total knee arthroplasty is deemed medically necessary. The treatment options including medical management, injection therapy arthroscopy and arthroplasty were discussed at length. The risks and benefits of total knee arthroplasty were presented and reviewed. The risks due to aseptic loosening, infection, stiffness, patella tracking problems, thromboembolic complications and other imponderables were discussed. The patient acknowledged the explanation, agreed to proceed with the plan and consent was signed. Patient is being admitted for inpatient treatment for surgery, pain control, PT, OT, prophylactic antibiotics, VTE prophylaxis, progressive ambulation and ADL's and discharge planning. The patient is planning to be discharged home with home health services

## 2012-11-16 NOTE — Anesthesia Postprocedure Evaluation (Signed)
  Anesthesia Post-op Note  Patient: [name]  Procedure(s) Performed: Procedure(s) (LRB): TOTAL KNEE ARTHROPLASTY (Left)  Patient Location: PACU  Anesthesia Type: Spinal  Level of Consciousness: awake and alert   Airway and Oxygen Therapy: Patient Spontanous Breathing  Post-op Pain: mild  Post-op Assessment: Post-op Vital signs reviewed, Patient's Cardiovascular Status Stable, Respiratory Function Stable, Patent Airway and No signs of Nausea or vomiting  Last Vitals:  Filed Vitals:   11/16/12 0526  BP: 136/75  Pulse: 57  Temp: 36.4 C  Resp: 16    Post-op Vital Signs: stable   Complications: No apparent anesthesia complications

## 2012-11-16 NOTE — Evaluation (Signed)
Physical Therapy Evaluation Patient Details Name: [name] MRN: [id number] DOB: 1956-01-21 Today's Date: 11/16/2012 Time: 9562-1308 PT Time Calculation (min): 37 min  PT Assessment / Plan / Recommendation Clinical Impression  Pt s/p L TKR presents with decreased L LE strength/ROM and post op pain limiting functional mobility    PT Assessment  Patient needs continued PT services    Follow Up Recommendations  Home health PT    Does the patient have the potential to tolerate intense rehabilitation      Barriers to Discharge None      Equipment Recommendations  None recommended by PT    Recommendations for Other Services OT consult   Frequency 7X/week    Precautions / Restrictions Precautions Precautions: Knee Required Braces or Orthoses: Knee Immobilizer - Left Knee Immobilizer - Left: Discontinue once straight leg raise with < 10 degree lag Restrictions Weight Bearing Restrictions: No Other Position/Activity Restrictions: WBAT   Pertinent Vitals/Pain 6-7/10; premedicated, ice packs provided.  Pt c/o increasing dizziness with standing - BP on return to sitting 105/71 - RN aware      Mobility  Bed Mobility Bed Mobility: Supine to Sit;Sit to Supine Supine to Sit: 4: Min assist Sit to Supine: 4: Min assist Details for Bed Mobility Assistance: cues for sequence and use of R LE to self assist -  Transfers Transfers: Sit to Stand;Stand to Sit Sit to Stand: 4: Min assist;3: Mod assist Stand to Sit: 4: Min assist;3: Mod assist Details for Transfer Assistance: cues for use of UEs and for LE managment Ambulation/Gait Ambulation/Gait Assistance:  (Pt stood at bedside only - ltd by dizziness/grogginess)    Shoulder Instructions     Exercises Total Joint Exercises Ankle Circles/Pumps: AROM;10 reps;Supine;Both Quad Sets: AROM;10 reps;Both;Supine Heel Slides: AAROM;10 reps;Left;Supine Straight Leg Raises: AAROM;10 reps;Supine;Left   PT Diagnosis: Difficulty  walking  PT Problem List: Decreased strength;Decreased range of motion;Decreased activity tolerance;Decreased mobility;Decreased knowledge of use of DME;Pain;Decreased knowledge of precautions PT Treatment Interventions: DME instruction;Gait training;Stair training;Functional mobility training;Therapeutic activities;Therapeutic exercise;Patient/family education   PT Goals Acute Rehab PT Goals PT Goal Formulation: With patient Time For Goal Achievement: 11/20/12 Potential to Achieve Goals: Good Pt will go Supine/Side to Sit: with supervision PT Goal: Supine/Side to Sit - Progress: Goal set today Pt will go Sit to Supine/Side: with supervision PT Goal: Sit to Supine/Side - Progress: Goal set today Pt will go Sit to Stand: with supervision PT Goal: Sit to Stand - Progress: Goal set today Pt will go Stand to Sit: with supervision PT Goal: Stand to Sit - Progress: Goal set today Pt will Ambulate: 51 - 150 feet PT Goal: Ambulate - Progress: Goal set today Pt will Go Up / Down Stairs: Flight;with min assist;with least restrictive assistive device PT Goal: Up/Down Stairs - Progress: Goal set today  Visit Information  Last PT Received On: 11/16/12 Assistance Needed: +1    Subjective Data  Subjective: I'm doing ok, I'll try Patient Stated Goal: Resume previous lifestyle with decreased pain   Prior Functioning  Home Living Lives With: Spouse Available Help at Discharge: Family Type of Home: House Home Access: Stairs to enter Secretary/administrator of Steps: 14 Entrance Stairs-Rails: Right Home Layout: One level Home Adaptive Equipment: Walker - rolling Additional Comments: Pt's wife states she can get RW  Prior Function Level of Independence: Independent Able to Take Stairs?: Yes Driving: Yes Vocation: Full time employment Communication Communication: No difficulties    Cognition  Overall Cognitive Status: Appears within functional limits  for tasks  assessed/performed Arousal/Alertness: Awake/alert Orientation Level: Appears intact for tasks assessed Behavior During Session: [facility] for tasks performed    Extremity/Trunk Assessment Right Upper Extremity Assessment RUE ROM/Strength/Tone: [facility] for tasks assessed Left Upper Extremity Assessment LUE ROM/Strength/Tone: WFL for tasks assessed Right Lower Extremity Assessment RLE ROM/Strength/Tone: [facility] for tasks assessed Left Lower Extremity Assessment LLE ROM/Strength/Tone: Deficits LLE ROM/Strength/Tone Deficits: AAROM at knee -10 - 40 with 2-/5 quads   Balance    End of Session PT - End of Session Equipment Utilized During Treatment: Gait belt;Left knee immobilizer Activity Tolerance: Other (comment) (dizziness) Patient left: in bed;with call bell/phone within reach;with family/visitor present Nurse Communication: Mobility status CPM Left Knee CPM Left Knee: Off  GP     Abbygail [place] 11/16/2012, 5:23 PM

## 2012-11-16 NOTE — Anesthesia Procedure Notes (Signed)
Spinal  Patient location during procedure: OR Staffing Anesthesiologist: Dazhane Villagomez Performed by: anesthesiologist  Preanesthetic Checklist Completed: patient identified, site marked, surgical consent, pre-op evaluation, timeout performed, IV checked, risks and benefits discussed and monitors and equipment checked Spinal Block Patient position: sitting Prep: Betadine Patient monitoring: heart rate, continuous pulse ox and blood pressure Approach: right paramedian Location: L3-4 Injection technique: single-shot Needle Needle type: Spinocan  Needle gauge: 22 G Needle length: 9 cm Additional Notes Expiration date of kit checked and confirmed. Patient tolerated procedure well, without complications.     

## 2012-11-16 NOTE — Preoperative (Signed)
Beta Blockers   Reason not to administer Beta Blockers:Not Applicable 

## 2012-11-16 NOTE — Progress Notes (Deleted)
Pharmacy: VTE prophylaxis recommendations s/p TKA  MD requesting Pharmacy to select VTE px s/p TKA.   Patient only has 1 kidney, however Scr at baseline is 1.27 with a CrCl of 95 ml/min.  Weight 104 kg.    Plan:  Lovenox 30 mg SQ Q12h has been ordered to start 12 hours post-operatively.   There is no significant drug interactions with plavix, will monitor for s/sx bleeding and CBC  [name], Loma Messing PharmD Pager #: [phone] 12:04 PM 11/16/2012

## 2012-11-16 NOTE — Transfer of Care (Signed)
Immediate Anesthesia Transfer of Care Note  Patient: [name]  Procedure(s) Performed: Procedure(s) (LRB) with comments: TOTAL KNEE ARTHROPLASTY (Left) - Left Total Knee Arthroplasty  Patient Location: PACU  Anesthesia Type:MAC and Spinal  Level of Consciousness: awake, alert , oriented and patient cooperative  Airway & Oxygen Therapy: Patient Spontanous Breathing and Patient connected to face mask oxygen  Post-op Assessment: Report given to PACU RN and Post -op Vital signs reviewed and stable  Post vital signs: Reviewed and stable  Complications: No apparent anesthesia complications

## 2012-11-16 NOTE — Anesthesia Preprocedure Evaluation (Addendum)
Anesthesia Evaluation  Patient identified by MRN, date of birth, ID band Patient awake    Reviewed: Allergy & Precautions, H&P , NPO status , Patient's Chart, lab work & pertinent test results  Airway Mallampati: II TM Distance: >3 FB Neck ROM: Full    Dental No notable dental hx.    Pulmonary neg pulmonary ROS, sleep apnea ,  breath sounds clear to auscultation  Pulmonary exam normal       Cardiovascular hypertension, Pt. on medications negative cardio ROS  Rhythm:Regular Rate:Normal     Neuro/Psych Seizures -,  Middle cerebral artery stenosis TIAnegative neurological ROS  negative psych ROS   GI/Hepatic negative GI ROS, Neg liver ROS,   Endo/Other  negative endocrine ROS  Renal/GU negative Renal ROS  negative genitourinary   Musculoskeletal negative musculoskeletal ROS (+)   Abdominal   Peds negative pediatric ROS (+)  Hematology negative hematology ROS (+)   Anesthesia Other Findings   Reproductive/Obstetrics negative OB ROS                          Anesthesia Physical Anesthesia Plan  ASA: II  Anesthesia Plan: Spinal   Post-op Pain Management:    Induction: Intravenous  Airway Management Planned: Simple Face Mask  Additional Equipment:   Intra-op Plan:   Post-operative Plan:   Informed Consent: I have reviewed the patients History and Physical, chart, labs and discussed the procedure including the risks, benefits and alternatives for the proposed anesthesia with the patient or authorized representative who has indicated his/her understanding and acceptance.   Dental advisory given  Plan Discussed with: CRNA  Anesthesia Plan Comments: (Pt off plavix for 6 days. Long discussion with patient re: risks/benefits GA vs SAB. We have agreed to do spinal.)        Anesthesia Quick Evaluation

## 2012-11-16 NOTE — Brief Op Note (Signed)
11/16/2012  10:12 AM  PATIENT:  [name]  56 y.o. male  PRE-OPERATIVE DIAGNOSIS:  Severe osteoarthritis left knee  POST-OPERATIVE DIAGNOSIS:  Severe osteoarthritis left knee  PROCEDURE:  Procedure(s) (LRB) with comments: TOTAL KNEE ARTHROPLASTY (Left) - Left Total Knee Arthroplasty  SURGEON:  Surgeon(s) and Role:    * Kathryne Hitch, MD - Primary  PHYSICIAN ASSISTANT:   ASSISTANTS: Maud Deed, PA-C   ANESTHESIA:   spinal  EBL:  Total I/O In: 2800 [I.V.:2800] Out: 275 [Urine:225; Blood:50]  BLOOD ADMINISTERED:none  DRAINS: (medium) Hemovact drain(s) in the knee joint with  Suction Open   LOCAL MEDICATIONS USED:  NONE  SPECIMEN:  No Specimen and Simple Mastectomy  DISPOSITION OF SPECIMEN:  N/A  COUNTS:  YES  TOURNIQUET:   Total Tourniquet Time Documented: Thigh (Left) - 102 minutes  DICTATION: .Other Dictation: Dictation Number [id number]  PLAN OF CARE: Admit to inpatient   PATIENT DISPOSITION:  PACU - hemodynamically stable.   Delay start of Pharmacological VTE agent (>24hrs) due to surgical blood loss or risk of bleeding: no

## 2012-11-16 NOTE — Op Note (Signed)
NAME:  [name], [name] NO.:  [id number]  MEDICAL RECORD NO.:  [id number]  LOCATION:  1609                         FACILITY:  [facility]  PHYSICIAN:  Vanita Panda. Magnus Ivan, M.D.DATE OF BIRTH:  09/17/1956  DATE OF PROCEDURE:  11/16/2012 DATE OF DISCHARGE:                              OPERATIVE REPORT   PREOPERATIVE DIAGNOSIS:  Severe end-stage arthritis, left knee.  POSTOPERATIVE DIAGNOSIS:  Severe end-stage arthritis, left knee.  PROCEDURE:  Left total knee arthroplasty.  IMPLANTS:  [employer] Triathlon knee with size 5 femur, size 6 tibial tray, size 9 polyethylene insert, size 35 patellar button.  SURGEON:  Vanita Panda. Magnus Ivan, M.D.  ASSISTANT:  Keith Neighbors, PA-C, whose assistant was needed throughout the case for every integral part of the case from opening to closure and to retracting and implant positioning.  ANESTHESIA:  Spinal.  BLOOD LOSS:  Less than 100 mL.  TOURNIQUET TIME:  Less than 2 hours.  ANTIBIOTICS:  2 g of IV Ancef.  COMPLICATIONS:  None.  INDICATIONS:  [name] is a 56 year old gentleman with knee that had severe trauma to it back in the 4s.  He had ACL reconstruction and over time, has developed bone-on-bone wear especially in the medial compartment.  It has gotten to where it affects his activities of daily living.  His x-rays show complete loss of the medial joint space with tricompartmental arthritis throughout marginal osteophytes of varus, varus deformity, subchondral sclerosis, and subchondral cyst.  Due to the failure of conservative treatment including anti-inflammatories and multiple injections as well as activity modification, he wished to proceed with a total knee arthroplasty due to the impact on his daily life.  PROCEDURE DESCRIPTION:  After informed consent was obtained and appropriate left knee was marked, he was brought to the operating room and spinal anesthesia was obtained while he was on the  stretcher.  He was then placed supine on the operating table.  A Foley catheter was placed and a nonsterile tourniquet was placed around his upper left thigh.  His left leg was then prepped and draped with DuraPrep and sterile drapes including sterile stockinette.  A time-out was called to identify the correct patient and correct left knee.  I then used an Esmarch to wrap out the leg and the tourniquet was inflated to 300 mm of pressure.  A midline incision was then made directly over the knee and carried proximally and distally.  I dissected down to the knee joint and carried out a medial parapatellar arthrotomy.  There was a large effusion that was encountered and significant, and once we got in the knee, we found that there was no cartilage remaining in the knee at all. We removed remnants of his ACL reconstruction and then took a significant amount of time to remove the hardware from the tibia, which was a metal bone stable.  Once we removed this, we could proceed with the knee arthroplasty.  First, we used our extramedullary guide with setting our tibial cutting guide at a 0 slope neutral varus valgus and taking 9 mm off the high side.  Once we had this set, we made our tibial cut.  We then drilled for intramedullary guide for  the femur for setting a left 5 degrees, externally rotated and a 10-mm resection off the femur.  Once this was accomplished, we brought the leg back down in extension and put our extension block of 9 mm and he was entirely straight and surprisingly not significant tightness on the medial side after we had released medially.  We then put our femoral cutting block on off the epicondylar axis and sized for a size 5 femur.  We put our 4- in-1 cutting block and cut our anterior and posterior cuts as well as our chamfer cuts.  We then made our femoral box cut and turned our attention back to the tibia.  We trialed for a size 6 tibia after a 5 femur and we put the  tibial plate on just medial to the tibial tubercle, and once we secured this, we did our punching keel for this.  With all trial components in place, we put the knee through range of motion, it was entirely full and stable varus, valgus and stable anterior, posterior.  We then cut the patella and drilled the lugs for a 35 patellar button.  With all trial components in place, I was pleased with the alignment and range of motion.  We then removed all trial components and copiously irrigated the knee with normal saline solution.  We then cemented the real size 6 tibia followed by the real size 5 femur.  We placed the real 9 mm polyethylene insert and cemented 35 patellar button.  I then used some of the femoral box cut to pack in as bone graft where we had removed the staple out of the tibia.  Once the cement had hardened, the tourniquet was let down, and hemostasis was obtained with electrocautery.  We placed a medium Hemovac drain in the arthrotomy and irrigated the knee again with normal saline solution.  We then closed the arthrotomy with interrupted #1 Vicryl followed by running 0 V- Loc suture, 2-0 Vicryl in the subcutaneous tissue, and staples were used to close the skin.  Xeroform followed by well-padded sterile dressings applied, and he was taken to the recovery room in stable condition.  All final counts were correct.  There were no complications noted, and again Keith Deed, PA-C, assistance was integral throughout the case.     Vanita Panda. Magnus Ivan, M.D.     CYB/MEDQ  D:  11/16/2012  T:  11/16/2012  Job:  [id number]

## 2012-11-17 LAB — CBC
HCT: 31.2 % — ABNORMAL LOW (ref 39.0–52.0)
Hemoglobin: 10.4 g/dL — ABNORMAL LOW (ref 13.0–17.0)
MCV: 83.4 fL (ref 78.0–100.0)
RBC: 3.74 MIL/uL — ABNORMAL LOW (ref 4.22–5.81)
WBC: 12.5 10*3/uL — ABNORMAL HIGH (ref 4.0–10.5)

## 2012-11-17 LAB — BASIC METABOLIC PANEL
BUN: 13 mg/dL (ref 6–23)
CO2: 28 mEq/L (ref 19–32)
Chloride: 99 mEq/L (ref 96–112)
Creatinine, Ser: 1.14 mg/dL (ref 0.50–1.35)

## 2012-11-17 NOTE — Progress Notes (Signed)
Physical Therapy Treatment Patient Details Name: [name] MRN: [id number] DOB: [birth year] Today's Date: 11/17/2012 Time: 7829-5621 PT Time Calculation (min): 25 min  PT Assessment / Plan / Recommendation Comments on Treatment Session       Follow Up Recommendations  Home health PT     Does the patient have the potential to tolerate intense rehabilitation     Barriers to Discharge        Equipment Recommendations  None recommended by PT    Recommendations for Other Services OT consult  Frequency 7X/week   Plan Discharge plan remains appropriate    Precautions / Restrictions Precautions Precautions: Knee Required Braces or Orthoses: Knee Immobilizer - Left Knee Immobilizer - Left: Discontinue once straight leg raise with < 10 degree lag Restrictions Weight Bearing Restrictions: No Other Position/Activity Restrictions: WBAT   Pertinent Vitals/Pain 5/10; premedicated    Mobility  Bed Mobility Bed Mobility: Sit to Supine Sit to Supine: 4: Min assist Details for Bed Mobility Assistance: cues for sequence and use of R LE to self assist -  Transfers Transfers: Sit to Stand;Stand to Sit Sit to Stand: 4: Min assist Stand to Sit: 4: Min guard Details for Transfer Assistance: cues for use of UEs and for LE managment Ambulation/Gait Ambulation/Gait Assistance: 4: Min guard Ambulation Distance (Feet): 148 Feet Assistive device: Rolling walker Ambulation/Gait Assistance Details: cues for position from RW and posture Gait Pattern: Step-to pattern    Exercises     PT Diagnosis:    PT Problem List:   PT Treatment Interventions:     PT Goals Acute Rehab PT Goals PT Goal Formulation: With patient Time For Goal Achievement: 11/20/12 Potential to Achieve Goals: Good Pt will go Supine/Side to Sit: with supervision PT Goal: Supine/Side to Sit - Progress: Progressing toward goal Pt will go Sit to Supine/Side: with supervision PT Goal: Sit to Supine/Side - Progress:  Progressing toward goal Pt will go Sit to Stand: with supervision PT Goal: Sit to Stand - Progress: Progressing toward goal Pt will go Stand to Sit: with supervision PT Goal: Stand to Sit - Progress: Progressing toward goal Pt will Ambulate: 51 - 150 feet PT Goal: Ambulate - Progress: Progressing toward goal  Visit Information  Last PT Received On: 11/17/12 Assistance Needed: +1    Subjective Data  Subjective: Are we going to try stairs tomorrow Patient Stated Goal: Resume previous lifestyle with decreased pain   Cognition  Overall Cognitive Status: Appears within functional limits for tasks assessed/performed Arousal/Alertness: Awake/alert Orientation Level: Appears intact for tasks assessed Behavior During Session: [facility] for tasks performed    Balance     End of Session PT - End of Session Equipment Utilized During Treatment: Left knee immobilizer Activity Tolerance: Patient tolerated treatment well Patient left: with call bell/phone within reach;with family/visitor present;in chair Nurse Communication: Mobility status   GP     Nasario Czerniak 11/17/2012, 2:13 PM

## 2012-11-17 NOTE — Progress Notes (Signed)
Subjective: 1 Day Post-Op Procedure(s) (LRB): TOTAL KNEE ARTHROPLASTY (Left) Patient reports pain as moderate.    Objective: Vital signs in last 24 hours: Temp:  [97.9 F (36.6 C)-99.2 F (37.3 C)] 99.2 F (37.3 C) (12/14 0631) Pulse Rate:  [59-68] 66  (12/14 0631) Resp:  [12-16] 16  (12/14 0631) BP: (121-174)/(70-82) 121/70 mmHg (12/14 0945) SpO2:  [97 %-100 %] 100 % (12/14 0631)  Intake/Output from previous day: 12/13 0701 - 12/14 0700 In: 4932.5 [P.O.:480; I.V.:4347.5; IV Piggyback:105] Out: 3170 [Urine:2450; Drains:670; Blood:50] Intake/Output this shift: Total I/O In: 240 [P.O.:240] Out: -    Basename 11/17/12 0453  HGB 10.4*    Basename 11/17/12 0453  WBC 12.5*  RBC 3.74*  HCT 31.2*  PLT 192    Basename 11/17/12 0453  NA 134*  K 4.1  CL 99  CO2 28  BUN 13  CREATININE 1.14  GLUCOSE 115*  CALCIUM 8.3*   No results found for this basename: LABPT:2,INR:2 in the last 72 hours  Neurologically intact  Assessment/Plan: 1 Day Post-Op Procedure(s) (LRB): TOTAL KNEE ARTHROPLASTY (Left) Up with therapy  Huynh,Keith C 11/17/2012, 12:53 PM

## 2012-11-17 NOTE — Progress Notes (Signed)
Pt setup with cpap for the night. Placed cpap at 10 cmH2O with his mask/circuit from home. He wore for a minute & said it felt good. He took it off because he was not ready for bed, but he can place back on himself when he is ready.  Jacqulynn Cadet RRT

## 2012-11-17 NOTE — Progress Notes (Signed)
Physical Therapy Treatment Patient Details Name: [name] MRN: [id number] DOB: 1956-02-21 Today's Date: 11/17/2012 Time: 1191-4782 PT Time Calculation (min): 31 min  PT Assessment / Plan / Recommendation Comments on Treatment Session       Follow Up Recommendations  Home health PT     Does the patient have the potential to tolerate intense rehabilitation     Barriers to Discharge        Equipment Recommendations  None recommended by PT    Recommendations for Other Services OT consult  Frequency 7X/week   Plan Discharge plan remains appropriate    Precautions / Restrictions Precautions Precautions: Knee Required Braces or Orthoses: Knee Immobilizer - Left Knee Immobilizer - Left: Discontinue once straight leg raise with < 10 degree lag Restrictions Weight Bearing Restrictions: No Other Position/Activity Restrictions: WBAT   Pertinent Vitals/Pain 5/10; premedicated, cold packs provided    Mobility  Transfers Transfers: Sit to Stand;Stand to Sit Sit to Stand: 4: Min assist Stand to Sit: 4: Min assist Details for Transfer Assistance: cues for use of UEs and for LE managment Ambulation/Gait Ambulation/Gait Assistance: 4: Min assist Ambulation Distance (Feet): 92 Feet Assistive device: Rolling walker Ambulation/Gait Assistance Details: cues for sequence, posture, position from RW, stride length Gait Pattern: Step-to pattern    Exercises Total Joint Exercises Ankle Circles/Pumps: AROM;Supine;Both;20 reps Quad Sets: AROM;10 reps;Both;Supine Heel Slides: AAROM;10 reps;Left;Supine Straight Leg Raises: AAROM;10 reps;Supine;Left   PT Diagnosis:    PT Problem List:   PT Treatment Interventions:     PT Goals Acute Rehab PT Goals PT Goal Formulation: With patient Time For Goal Achievement: 11/20/12 Potential to Achieve Goals: Good Pt will go Sit to Stand: with supervision PT Goal: Sit to Stand - Progress: Progressing toward goal Pt will go Stand to Sit: with  supervision PT Goal: Stand to Sit - Progress: Progressing toward goal Pt will Ambulate: 51 - 150 feet PT Goal: Ambulate - Progress: Progressing toward goal Pt will Go Up / Down Stairs: Flight;with min assist;with least restrictive assistive device  Visit Information  Last PT Received On: 11/17/12 Assistance Needed: +1    Subjective Data  Subjective: I'm doing much better than last night Patient Stated Goal: Resume previous lifestyle with decreased pain   Cognition  Overall Cognitive Status: Appears within functional limits for tasks assessed/performed Arousal/Alertness: Awake/alert Orientation Level: Appears intact for tasks assessed Behavior During Session: [facility] for tasks performed    Balance     End of Session PT - End of Session Equipment Utilized During Treatment: Gait belt;Left knee immobilizer Activity Tolerance: Other (comment) Patient left: with call bell/phone within reach;with family/visitor present;in chair Nurse Communication: Mobility status   GP     [name] 11/17/2012, 1:05 PM

## 2012-11-17 NOTE — Progress Notes (Signed)
OT Note:  Pt screened for OT.  He has a walk in shower, can borrow a 3:1 and has wife to help with adls as needed.  [place], OTR/L [phone] 11/17/2012

## 2012-11-18 ENCOUNTER — Inpatient Hospital Stay (HOSPITAL_COMMUNITY): Payer: 59

## 2012-11-18 DIAGNOSIS — G459 Transient cerebral ischemic attack, unspecified: Secondary | ICD-10-CM

## 2012-11-18 LAB — LIPID PANEL
Cholesterol: 103 mg/dL (ref 0–200)
HDL: 31 mg/dL — ABNORMAL LOW (ref >39)
Total CHOL/HDL Ratio: 3.3 RATIO
VLDL: 33 mg/dL (ref 0–40)

## 2012-11-18 LAB — CBC
HCT: 29.2 % — ABNORMAL LOW (ref 39.0–52.0)
Hemoglobin: 10 g/dL — ABNORMAL LOW (ref 13.0–17.0)
MCHC: 34.2 g/dL (ref 30.0–36.0)
MCV: 83.9 fL (ref 78.0–100.0)
RDW: 13.5 % (ref 11.5–15.5)

## 2012-11-18 MED ORDER — ASPIRIN EC 81 MG PO TBEC
81.0000 mg | DELAYED_RELEASE_TABLET | Freq: Every day | ORAL | Status: DC
  Administered 2012-11-19 – 2012-11-21 (×3): 81 mg via ORAL
  Filled 2012-11-18 (×3): qty 1

## 2012-11-18 NOTE — Progress Notes (Signed)
Physical Therapy Treatment Patient Details Name: [name] MRN: [id number] DOB: [birth year] Today's Date: 11/18/2012 Time: 4098-1191 PT Time Calculation (min): 35 min  PT Assessment / Plan / Recommendation Comments on Treatment Session  No c/o dizziness, weakness, numbness, slurred speech during session    Follow Up Recommendations  Home health PT     Does the patient have the potential to tolerate intense rehabilitation     Barriers to Discharge        Equipment Recommendations  None recommended by PT    Recommendations for Other Services OT consult  Frequency 7X/week   Plan Discharge plan remains appropriate    Precautions / Restrictions Precautions Precautions: Knee Required Braces or Orthoses: Knee Immobilizer - Left Knee Immobilizer - Left: Discontinue once straight leg raise with < 10 degree lag Restrictions Weight Bearing Restrictions: No Other Position/Activity Restrictions: WBAT   Pertinent Vitals/Pain 5/10; premedicated, cold packs provided    Mobility  Bed Mobility Bed Mobility: Sit to Supine Sit to Supine: 4: Min assist Details for Bed Mobility Assistance: cues for sequence and use of R LE to self assist -  Transfers Transfers: Sit to Stand;Stand to Sit Sit to Stand: 4: Min guard Stand to Sit: 4: Min guard Details for Transfer Assistance: cues for use of UEs and for LE managment Ambulation/Gait Ambulation/Gait Assistance: 4: Min guard Ambulation Distance (Feet): 189 Feet Assistive device: Rolling walker Ambulation/Gait Assistance Details: min cues for posture and position from RW Gait Pattern: Step-to pattern    Exercises Total Joint Exercises Ankle Circles/Pumps: AROM;Supine;Both;20 reps Quad Sets: AROM;Both;Supine;20 reps Heel Slides: AAROM;Left;Supine;20 reps Straight Leg Raises: AAROM;Supine;Left;20 reps   PT Diagnosis:    PT Problem List:   PT Treatment Interventions:     PT Goals Acute Rehab PT Goals PT Goal Formulation: With  patient Time For Goal Achievement: 11/20/12 Potential to Achieve Goals: Good Pt will go Supine/Side to Sit: with supervision PT Goal: Supine/Side to Sit - Progress: Progressing toward goal Pt will go Sit to Supine/Side: with supervision PT Goal: Sit to Supine/Side - Progress: Progressing toward goal Pt will go Sit to Stand: with supervision PT Goal: Sit to Stand - Progress: Progressing toward goal Pt will go Stand to Sit: with supervision PT Goal: Stand to Sit - Progress: Progressing toward goal Pt will Ambulate: 51 - 150 feet PT Goal: Ambulate - Progress: Progressing toward goal  Visit Information  Last PT Received On: 11/18/12 Assistance Needed: +1    Subjective Data  Subjective: I feel ok but I might have had another TIA , the neurologist is going to run tests Patient Stated Goal: Resume previous lifestyle with decreased pain   Cognition  Overall Cognitive Status: Appears within functional limits for tasks assessed/performed Arousal/Alertness: Awake/alert Orientation Level: Appears intact for tasks assessed Behavior During Session: [facility] for tasks performed    Balance     End of Session PT - End of Session Equipment Utilized During Treatment: Left knee immobilizer Activity Tolerance: Patient tolerated treatment well Patient left: in bed;with call bell/phone within reach;with family/visitor present Nurse Communication: Mobility status   GP     [name] 11/18/2012, 12:54 PM

## 2012-11-18 NOTE — Progress Notes (Signed)
Pt has had no noted neuro deficits since transferring to our floor today.  A&O x4, moves all ext, speech clear

## 2012-11-18 NOTE — Progress Notes (Signed)
Pt got up to bedside commode around 0600 to try and make BM.  While on [facility], pt wife reports that pt's left hand went weak and pt dropped his coffee.  Pt left side of face appeared to be droopy and pt could not get his words out and pt didn't make sense.  Pt's wife states the episode lasted about 2 minutes.  Pt wife states he has had episodes like this before and he was taking plavix and aspirin and the episodes improved.  These meds were stopped pre-op for 5 days and they were resumed yesterday 12/14.  Upon arriving to room, pt had recovered from reported episode that wife described and pt had no weakness and his speech was normal.  Pt was alert and oriented and could state his full name without difficulty.  VSS, afebrile.  Offered to make a call to the oncall MD Dr.Yates.  Wife states she would rather talk to Dr.Blackman.  Wife states she has Dr.Blackmans home number and he told her to call whenever she needed to.  Wife states she will call him now.

## 2012-11-18 NOTE — Consult Note (Signed)
Referring Physician: Dr. Magnus Ivan    Chief Complaint: Transient speech difficulty and right upper extremity weakness.  HPI: [name] is an 56 y.o. male with a history of stroke and November 2012, hypertension, hyperlipidemia and partial seizures, who experienced a transient episode of expressive aphasia and right upper extremity weakness earlier today. He is status post left total knee replacement 2 days ago. He has been on Plavix for stroke prevention. Plavix was discontinued one week prior to surgery, and was resumed on 11/17/2012. Symptoms completely resolved with no recurrence. There was no associated salty taste in his mouth at the time of his symptoms this morning.  LSN: 6:00 AM today tPA Given: No: Rapid resolution of symptoms MRankin: 0  Past Medical History  Diagnosis Date  . Renal cell carcinoma   . Hypertension   . Sleep apnea     CPAP  . High cholesterol   . Arthritis   . Shortness of breath     uses cpap  . Stroke 11/03/2011    TIA  . Depression   . Seizures     Family History  Problem Relation Age of Onset  . Coronary artery disease Father      Medications:  Scheduled:   . amLODipine  5 mg Oral Daily   And  . irbesartan  300 mg Oral Daily  . aspirin EC  325 mg Oral Daily  . atorvastatin  80 mg Oral q1800  . citalopram  10 mg Oral q morning - 10a  . clopidogrel  75 mg Oral Q breakfast  . docusate sodium  100 mg Oral BID  . levETIRAcetam  250 mg Oral BID  . OxyCODONE  20 mg Oral Q12H   WJX:BJYNWGNFAOZHY, acetaminophen, alum & mag hydroxide-simeth, diphenhydrAMINE, HYDROmorphone (DILAUDID) injection, menthol-cetylpyridinium, methocarbamol (ROBAXIN) IV, methocarbamol, metoCLOPramide (REGLAN) injection, metoCLOPramide, ondansetron (ZOFRAN) IV, ondansetron, oxyCODONE, phenol, zolpidem   Physical Examination: Blood pressure 107/67, pulse 67, temperature 98.6 F (37 C), temperature source Oral, resp. rate 20, height 5\' 10"  (1.778 m), weight 104.327 kg  (230 lb), SpO2 98.00%.  Neurologic Examination: Mental Status: Alert, oriented, thought content appropriate.  Speech fluent without evidence of aphasia. Able to follow commands without difficulty. Cranial Nerves: II-Visual fields were normal. III/IV/VI-Pupils were equal and reacted. Extraocular movements were full and conjugate.    V/VII-no facial numbness and no facial weakness. VIII-normal. X-normal speech and symmetrical palatal movement. XII-midline tongue extension Motor: 5/5 bilaterally with normal tone and bulk; proximal strength of left lower extremity was not tested due to discomfort with movement. Sensory: Normal throughout. Deep Tendon Reflexes: 2+ and symmetric other than left knee jerk (not tested). Plantars: Mute bilaterally Cerebellar: Normal finger-to-nose testing. Carotid auscultation: Normal   No results found.  Assessment: 56 y.o. male and probably experienced a subcortical TIA involving the left middle cerebral artery territory. Deficits resolved with no residual. Recurrent small subcortical stroke cannot be ruled out, however.  Stroke Risk Factors - hyperlipidemia and hypertension  Plan: 1. HgbA1c, fasting lipid panel 2. CT scan of the head, and CT angiogram of head and neck 3. Echocardiogram 4. Prophylactic therapy-Antiplatelet med: Aspirin 81 mg per day and  Plavix 75 mg per day 5. Risk factor modification 6. Telemetry monitoring  C.R. Roseanne Reno, MD [facility] [phone]  11/18/2012, 10:46 AM

## 2012-11-18 NOTE — Progress Notes (Signed)
Subjective: 2 Days Post-Op Procedure(s) (LRB): TOTAL KNEE ARTHROPLASTY (Left) Patient reports pain as moderate.  Did have a possible very short lasting "TIA" today similar to one he had a year ago.  He is on plavix and aspirin now.  He currently denies any symptoms.  He describes a few seconds of right-sided weakness.  Objective: Vital signs in last 24 hours: Temp:  [98.4 F (36.9 C)-100 F (37.8 C)] 98.6 F (37 C) (12/15 0640) Pulse Rate:  [59-73] 67  (12/15 0640) Resp:  [16-20] 20  (12/15 0640) BP: (106-137)/(62-74) 107/67 mmHg (12/15 0922) SpO2:  [98 %-99 %] 98 % (12/15 0640)  Intake/Output from previous day: 12/14 0701 - 12/15 0700 In: 1080 [P.O.:1080] Out: 975 [Urine:975] Intake/Output this shift: Total I/O In: 240 [P.O.:240] Out: -    Basename 11/18/12 0500 11/17/12 0453  HGB 10.0* 10.4*    Basename 11/18/12 0500 11/17/12 0453  WBC 12.6* 12.5*  RBC 3.48* 3.74*  HCT 29.2* 31.2*  PLT 176 192    Basename 11/17/12 0453  NA 134*  K 4.1  CL 99  CO2 28  BUN 13  CREATININE 1.14  GLUCOSE 115*  CALCIUM 8.3*   No results found for this basename: LABPT:2,INR:2 in the last 72 hours  Sensation intact distally Intact pulses distally Dorsiflexion/Plantar flexion intact Incision: no drainage No cellulitis present Compartment soft Bilateral upper ext with intact sensation and strength. PERRL, EOMI  Assessment/Plan: 2 Days Post-Op Procedure(s) (LRB): TOTAL KNEE ARTHROPLASTY (Left) Up with therapy Neurology consult called for recs  BLACKMAN,CHRISTOPHER Y 11/18/2012, 10:00 AM

## 2012-11-19 ENCOUNTER — Inpatient Hospital Stay (HOSPITAL_COMMUNITY): Payer: 59

## 2012-11-19 ENCOUNTER — Ambulatory Visit (HOSPITAL_COMMUNITY)
Admission: RE | Admit: 2012-11-19 | Discharge: 2012-11-19 | Disposition: A | Discharge status: Home / Self Care | Payer: 59 | Source: Ambulatory Visit | Attending: Neurology | Admitting: Neurology

## 2012-11-19 ENCOUNTER — Encounter (HOSPITAL_COMMUNITY): Payer: Self-pay | Admitting: Orthopaedic Surgery

## 2012-11-19 DIAGNOSIS — I669 Occlusion and stenosis of unspecified cerebral artery: Secondary | ICD-10-CM

## 2012-11-19 DIAGNOSIS — F329 Major depressive disorder, single episode, unspecified: Secondary | ICD-10-CM

## 2012-11-19 DIAGNOSIS — E785 Hyperlipidemia, unspecified: Secondary | ICD-10-CM

## 2012-11-19 LAB — CBC
MCH: 27.8 pg (ref 26.0–34.0)
Platelets: 177 10*3/uL (ref 150–400)
RBC: 3.24 MIL/uL — ABNORMAL LOW (ref 4.22–5.81)
RDW: 13.4 % (ref 11.5–15.5)
WBC: 11.3 10*3/uL — ABNORMAL HIGH (ref 4.0–10.5)

## 2012-11-19 MED ORDER — DOCUSATE SODIUM 100 MG PO CAPS: 100.0000 mg | ORAL_CAPSULE | Freq: Two times a day (BID) | ORAL | Status: DC

## 2012-11-19 MED ORDER — MIDAZOLAM HCL 2 MG/2ML IJ SOLN
INTRAMUSCULAR | Status: AC | PRN
  Administered 2012-11-19: 1 mg via INTRAVENOUS

## 2012-11-19 MED ORDER — IOHEXOL 300 MG/ML  SOLN: 200.0000 mL | Freq: Once | INTRAMUSCULAR | Status: AC | PRN

## 2012-11-19 MED ORDER — SODIUM CHLORIDE 0.9 % IV SOLN
INTRAVENOUS | Status: AC
  Administered 2012-11-19 – 2012-11-20 (×2): via INTRAVENOUS

## 2012-11-19 MED ORDER — SODIUM CHLORIDE 0.9 % IV SOLN: Freq: Once | INTRAVENOUS | Status: DC

## 2012-11-19 MED ORDER — FENTANYL CITRATE 0.05 MG/ML IJ SOLN
INTRAMUSCULAR | Status: AC | PRN
  Administered 2012-11-19: 25 ug via INTRAVENOUS

## 2012-11-19 MED ORDER — SENNA 8.6 MG PO TABS
2.0000 | ORAL_TABLET | Freq: Every day | ORAL | Status: DC
  Administered 2012-11-19 – 2012-11-20 (×2): 17.2 mg via ORAL
  Filled 2012-11-19 (×3): qty 2

## 2012-11-19 MED ORDER — MORPHINE SULFATE 2 MG/ML IJ SOLN: 1.0000 mg | INTRAMUSCULAR | Status: DC | PRN

## 2012-11-19 NOTE — Progress Notes (Signed)
Patient place on auto bipap black box. Patient is tolerating bipap well at this time. RT will continue to monitor.

## 2012-11-19 NOTE — ED Notes (Signed)
Pt  hnd grips strong and equal; R leg and L leg strong; L foot only seems weaker because pt had L knee surgery 2 days ago and painful for him to move L leg and foot. Pedal pulses strong.

## 2012-11-19 NOTE — Progress Notes (Signed)
Subjective: 3 Days Post-Op Procedure(s) (LRB): TOTAL KNEE ARTHROPLASTY (Left) Patient reports pain as moderate.  Appreciate Neurology consult.  I am holding BP meds given his slight hypotension in am the last 2 days.  Echo scheduled for today.  Objective: Vital signs in last 24 hours: Temp:  [97.9 F (36.6 C)-99.4 F (37.4 C)] 98.9 F (37.2 C) (12/16 0541) Pulse Rate:  [63-100] 70  (12/16 0541) Resp:  [16-18] 18  (12/16 0541) BP: (106-140)/(51-67) 124/53 mmHg (12/16 0541) SpO2:  [97 %-100 %] 98 % (12/16 0541)  Intake/Output from previous day: 12/15 0701 - 12/16 0700 In: 680 [P.O.:680] Out: 970 [Urine:970] Intake/Output this shift:     Basename 11/19/12 0443 11/18/12 0500 11/17/12 0453  HGB 9.0* 10.0* 10.4*    Basename 11/19/12 0443 11/18/12 0500  WBC 11.3* 12.6*  RBC 3.24* 3.48*  HCT 26.8* 29.2*  PLT 177 176    Basename 11/17/12 0453  NA 134*  K 4.1  CL 99  CO2 28  BUN 13  CREATININE 1.14  GLUCOSE 115*  CALCIUM 8.3*   No results found for this basename: LABPT:2,INR:2 in the last 72 hours  Sensation intact distally Intact pulses distally Dorsiflexion/Plantar flexion intact Incision: scant drainage No cellulitis present Compartment soft  Assessment/Plan: 3 Days Post-Op Procedure(s) (LRB): TOTAL KNEE ARTHROPLASTY (Left) Up with therapy Plan for discharge tomorrow Continue aspirin and plavix  BLACKMAN,CHRISTOPHER Y 11/19/2012, 7:05 AM

## 2012-11-19 NOTE — Progress Notes (Signed)
Called to room by PT. Patient on toilet, PT reported that Pt   Had right arm weakness, dysphasia, and slow to respond. B/P 116/49. Patient states vision blurred, numbness and tingling in right arm, hand, and fingers, thought processess slow and brain feels foggy. Equal hand strenghth noted and eyes tracked correctly. Assissted to recliner,Pt states event has passed.

## 2012-11-19 NOTE — H&P (Signed)
Chief Complaint: TIAs Referring Physician:Stewart HPI: [name] is an 56 y.o. male with known hx of (L)MCA stenosis who has been followed conservatively. Last angio was 11/03/11. Pt just had (L)TKA and was rehabing when developed sxs of right UE weakness and flat affect and speech difficulty. Sxs persisted and neurology consulted. He is now here at [facility] for angiogram due to TIAs. PMHx, labs, meds, reviewed.  Past Medical History:  Past Medical History  Diagnosis Date  . Renal cell carcinoma   . Hypertension   . Sleep apnea     CPAP  . High cholesterol   . Arthritis   . Shortness of breath     uses cpap  . Stroke 11/03/2011    TIA  . Depression   . Seizures     Past Surgical History:  Past Surgical History  Procedure Date  . Nephrectomy   . Carpal tunnel release   . Knee arthroscopy   . Anterior cruciate ligament repair   . Biceps tendon repair   . Total knee arthroplasty 11/16/2012    Procedure: TOTAL KNEE ARTHROPLASTY;  Surgeon: Kathryne Hitch, MD;  Location: [facility] ORS;  Service: Orthopedics;  Laterality: Left;  Left Total Knee Arthroplasty    Family History:  Family History  Problem Relation Age of Onset  . Coronary artery disease Father     Social History:  reports that he quit smoking about 23 years ago. His smoking use included Cigarettes. He quit after 2 years of use. He has never used smokeless tobacco. He reports that he drinks alcohol. He reports that he does not use illicit drugs.  Allergies: No Known Allergies  Medications: Current Facility-Administered Medications on File Prior to Encounter  Medication Dose Route Frequency Provider Last Rate Last Dose  . 0.9 %  sodium chloride infusion   Intravenous Continuous Kathryne Hitch, MD      . 0.9 %  sodium chloride infusion   Intravenous Once Noel Christmas      . acetaminophen (TYLENOL) tablet 650 mg  650 mg Oral Q6H PRN Kathryne Hitch, MD       Or  . acetaminophen  (TYLENOL) suppository 650 mg  650 mg Rectal Q6H PRN Kathryne Hitch, MD      . alum & mag hydroxide-simeth (MAALOX/MYLANTA) 200-200-20 MG/5ML suspension 30 mL  30 mL Oral Q4H PRN Kathryne Hitch, MD      . aspirin EC tablet 81 mg  81 mg Oral Daily Charles Stewart   81 mg at 11/19/12 0951  . atorvastatin (LIPITOR) tablet 80 mg  80 mg Oral q1800 Kathryne Hitch, MD   80 mg at 11/18/12 2013  . citalopram (CELEXA) tablet 10 mg  10 mg Oral q morning - 10a Kathryne Hitch, MD   10 mg at 11/19/12 [phone]  . clopidogrel (PLAVIX) tablet 75 mg  75 mg Oral Q breakfast Kathryne Hitch, MD   75 mg at 11/19/12 0738  . diphenhydrAMINE (BENADRYL) 12.5 MG/5ML elixir 12.5-25 mg  12.5-25 mg Oral Q4H PRN Kathryne Hitch, MD      . docusate sodium (COLACE) capsule 100 mg  100 mg Oral BID Kathryne Hitch, MD   100 mg at 11/19/12 0951  . HYDROmorphone (DILAUDID) injection 1 mg  1 mg Intravenous Q2H PRN Kathryne Hitch, MD   1 mg at 11/17/12 0255  . levETIRAcetam (KEPPRA) tablet 250 mg  250 mg Oral BID Kathryne Hitch, MD   250 mg  at 11/19/12 0951  . menthol-cetylpyridinium (CEPACOL) lozenge 3 mg  1 lozenge Oral PRN Kathryne Hitch, MD       Or  . phenol (CHLORASEPTIC) mouth spray 1 spray  1 spray Mouth/Throat PRN Kathryne Hitch, MD      . methocarbamol (ROBAXIN) tablet 500 mg  500 mg Oral Q6H PRN Kathryne Hitch, MD   500 mg at 11/18/12 1130   Or  . methocarbamol (ROBAXIN) 500 mg in dextrose 5 % 50 mL IVPB  500 mg Intravenous Q6H PRN Kathryne Hitch, MD   500 mg at 11/16/12 1138  . metoCLOPramide (REGLAN) tablet 5-10 mg  5-10 mg Oral Q8H PRN Kathryne Hitch, MD       Or  . metoCLOPramide (REGLAN) injection 5-10 mg  5-10 mg Intravenous Q8H PRN Kathryne Hitch, MD      . ondansetron [facility]) tablet 4 mg  4 mg Oral Q6H PRN Kathryne Hitch, MD       Or  . ondansetron [facility]) injection 4 mg  4 mg Intravenous Q6H  PRN Kathryne Hitch, MD      . oxyCODONE (Oxy IR/ROXICODONE) immediate release tablet 5-10 mg  5-10 mg Oral Q3H PRN Kathryne Hitch, MD   5 mg at 11/19/12 [phone]  . OxyCODONE (OXYCONTIN) 12 hr tablet 20 mg  20 mg Oral Q12H Kathryne Hitch, MD   20 mg at 11/19/12 0951  . zolpidem (AMBIEN) tablet 5 mg  5 mg Oral QHS PRN Kathryne Hitch, MD       Current Outpatient Prescriptions on File Prior to Encounter  Medication Sig Dispense Refill  . amLODipine-olmesartan (AZOR) 5-40 MG per tablet Take 1 tablet by mouth every morning.       . citalopram (CELEXA) 10 MG tablet Take 10 mg by mouth every morning.      . clopidogrel (PLAVIX) 75 MG tablet Take 75 mg by mouth daily.      . Coenzyme Q10 (CO Q-10) 200 MG CAPS Take 200 mg by mouth.      . Colesevelam HCl 3.75 G PACK Take 3.75 g by mouth at bedtime.       . fish oil-omega-3 fatty acids 1000 MG capsule Take 1 g by mouth daily.        Marland Kitchen levETIRAcetam (KEPPRA) 250 MG tablet Take 250 mg by mouth 2 (two) times daily.      Marland Kitchen pyridoxine (B-6) 200 MG tablet Take 200 mg by mouth daily.       . rosuvastatin (CRESTOR) 40 MG tablet Take 40 mg by mouth every morning.      . zolpidem (AMBIEN) 5 MG tablet Take 5 mg by mouth at bedtime as needed.         Please HPI for pertinent positives, otherwise complete 10 system ROS negative.  Physical Exam: There were no vitals taken for this visit. There is no height or weight on file to calculate BMI.   General Appearance:  Alert, cooperative, no distress, appears stated age  Head:  Normocephalic, without obvious abnormality, atraumatic  ENT: Unremarkable  Neck: Supple, symmetrical, trachea midline, no adenopathy, thyroid: not enlarged, symmetric, no tenderness/mass/nodules  Lungs:   Clear to auscultation bilaterally, no w/r/r, respirations unlabored without use of accessory muscles.  Chest Wall:  No tenderness or deformity  Heart:  Regular rate and rhythm, S1, S2 normal, no murmur, rub or  gallop. Carotids 2+ without bruit.  Pulses: 2+ and symmetric  Neurologic: Normal affect,  no gross deficits.   Results for orders placed during the hospital encounter of 11/16/12 (from the past 48 hour(s))  CBC     Status: Abnormal   Collection Time   11/18/12  5:00 AM      Component Value Range Comment   WBC 12.6 (*) 4.0 - 10.5 K/uL    RBC 3.48 (*) 4.22 - 5.81 MIL/uL    Hemoglobin 10.0 (*) 13.0 - 17.0 g/dL    HCT 40.9 (*) 81.1 - 52.0 %    MCV 83.9  78.0 - 100.0 fL    MCH 28.7  26.0 - 34.0 pg    MCHC 34.2  30.0 - 36.0 g/dL    RDW 91.4  78.2 - 95.6 %    Platelets 176  150 - 400 K/uL   HEMOGLOBIN A1C     Status: Normal   Collection Time   11/18/12 11:30 AM      Component Value Range Comment   Hemoglobin A1C 5.4  <5.7 %    Mean Plasma Glucose 108  <117 mg/dL   LIPID PANEL     Status: Abnormal   Collection Time   11/18/12 11:35 AM      Component Value Range Comment   Cholesterol 103  0 - 200 mg/dL    Triglycerides 213 (*) <150 mg/dL    HDL 31 (*) >08 mg/dL    Total CHOL/HDL Ratio 3.3      VLDL 33  0 - 40 mg/dL    LDL Cholesterol 39  0 - 99 mg/dL   CBC     Status: Abnormal   Collection Time   11/19/12  4:43 AM      Component Value Range Comment   WBC 11.3 (*) 4.0 - 10.5 K/uL    RBC 3.24 (*) 4.22 - 5.81 MIL/uL    Hemoglobin 9.0 (*) 13.0 - 17.0 g/dL    HCT 65.7 (*) 84.6 - 52.0 %    MCV 82.7  78.0 - 100.0 fL    MCH 27.8  26.0 - 34.0 pg    MCHC 33.6  30.0 - 36.0 g/dL    RDW 96.2  95.2 - 84.1 %    Platelets 177  150 - 400 K/uL    Ct Angio Head W/cm &/or Wo Cm  11/18/2012  *RADIOLOGY REPORT*  Clinical Data:  Suspected subcortical TIA involving the left MCA territory.  Normal neurologic exam.  Prior history of stroke. Plavix recently discontinued due to knee surgery.  CT ANGIOGRAPHY HEAD AND NECK  Technique:  Multidetector CT imaging of the head and neck was performed using the standard protocol during bolus administration of intravenous contrast.  Multiplanar CT image  reconstructions including MIPs were obtained to evaluate the vascular anatomy. Carotid stenosis measurements (when applicable) are obtained utilizing NASCET criteria, using the distal internal carotid diameter as the denominator.  Contrast:  100 ml Omnipaque 350.  Comparison:  Formal catheter angiogram 11/03/2011.  CTA NECK  Findings:  There is no significant atheromatous change of the transverse arch or great vessel origins.  Conventional branching pattern.  No pneumothorax or lung apex mass.  No thyroid lesions. No significant adenopathy.  Moderate cervical spondylosis.  There is a shallow calcific plaque at the left internal carotid artery origin. There is no measurable stenosis.  There is no soft plaque or ulceration.  There is no dissection or fibromuscular dysplasia.  There is nonstenotic calcific plaque at the right internal carotid artery origin without ulceration or soft plaque. There is no  measurable stenosis.  There is no dissection or fibromuscular dysplasia.  Both vertebral arteries are patent with the without significant ostial stenosis.  There equal in size.   Review of the MIP images confirms the above findings.  IMPRESSION: Nonstenotic calcific plaque at both internal carotid artery origins.  There is good general agreement with prior catheter angiogram.  CTA HEAD  Findings:  There is no evidence for intracranial hemorrhage, mass lesion, hydrocephalus, or extra-axial fluid.  There is no atrophy. Mild chronic microvascular ischemic changes are suggested in the periventricular and subcortical white matter.  There is a new 5 mm hypodensity involving the left internal capsule genu (image 14 series 16) not present previously.  I suspect this is an acute medial lenticulostriate infarct.  Post infusion, no abnormal intracranial enhancement.  Calvarium intact.  Moderate sinus disease.  No mastoid fluid.  Right internal carotid artery demonstrates a 50% calcific stenosis at the cavernous/supraclinoid  segment.  This is similar to prior catheter angiogram.  Right A1 segment hypoplastic.  Right middle cerebral artery normal.  Normal left internal carotid artery through the skull base and supraclinoid segments but except for slight nonstenotic lateral cavernous calcification.  Both anterior cerebral arteries fill from the left.  There is a severe pre-occlusive narrowing of the left middle cerebral artery M1 segment (images 99 - 100 series 605).  It is unclear to me if the left MCA fills antegrade or there are collaterals which bypass this severe stenosis.  The caliber of the left M2 and M3 MCA vessels appear normal.  There is no MCA branch occlusion observed.  The basilar artery is widely patent with the vertebrals codominant. No cerebellar branch occlusion is seen.  Compared with angiography, a similar appearance is noted.   Review of the MIP images confirms the above findings.  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  See discussion above. Formal catheter angiogram could be helpful in further quantification.  New hypodensity located at the genu of the internal capsule on the left, suspect acute 5 mm medial lenticulostriate territory infarct, not present 2012.   Original Report Authenticated By: Davonna Belling, M.D.    Ct Angio Neck W/cm &/or Wo/cm  11/18/2012  *RADIOLOGY REPORT*  Clinical Data:  Suspected subcortical TIA involving the left MCA territory.  Normal neurologic exam.  Prior history of stroke. Plavix recently discontinued due to knee surgery.  CT ANGIOGRAPHY HEAD AND NECK  Technique:  Multidetector CT imaging of the head and neck was performed using the standard protocol during bolus administration of intravenous contrast.  Multiplanar CT image reconstructions including MIPs were obtained to evaluate the vascular anatomy. Carotid stenosis measurements (when applicable) are obtained utilizing NASCET criteria, using the distal internal carotid diameter as the denominator.   Contrast:  100 ml Omnipaque 350.  Comparison:  Formal catheter angiogram 11/03/2011.  CTA NECK  Findings:  There is no significant atheromatous change of the transverse arch or great vessel origins.  Conventional branching pattern.  No pneumothorax or lung apex mass.  No thyroid lesions. No significant adenopathy.  Moderate cervical spondylosis.  There is a shallow calcific plaque at the left internal carotid artery origin. There is no measurable stenosis.  There is no soft plaque or ulceration.  There is no dissection or fibromuscular dysplasia.  There is nonstenotic calcific plaque at the right internal carotid artery origin without ulceration or soft plaque. There is no measurable stenosis.  There is no dissection or fibromuscular dysplasia.  Both vertebral arteries are patent  with the without significant ostial stenosis.  There equal in size.   Review of the MIP images confirms the above findings.  IMPRESSION: Nonstenotic calcific plaque at both internal carotid artery origins.  There is good general agreement with prior catheter angiogram.  CTA HEAD  Findings:  There is no evidence for intracranial hemorrhage, mass lesion, hydrocephalus, or extra-axial fluid.  There is no atrophy. Mild chronic microvascular ischemic changes are suggested in the periventricular and subcortical white matter.  There is a new 5 mm hypodensity involving the left internal capsule genu (image 14 series 16) not present previously.  I suspect this is an acute medial lenticulostriate infarct.  Post infusion, no abnormal intracranial enhancement.  Calvarium intact.  Moderate sinus disease.  No mastoid fluid.  Right internal carotid artery demonstrates a 50% calcific stenosis at the cavernous/supraclinoid segment.  This is similar to prior catheter angiogram.  Right A1 segment hypoplastic.  Right middle cerebral artery normal.  Normal left internal carotid artery through the skull base and supraclinoid segments but except for slight  nonstenotic lateral cavernous calcification.  Both anterior cerebral arteries fill from the left.  There is a severe pre-occlusive narrowing of the left middle cerebral artery M1 segment (images 99 - 100 series 605).  It is unclear to me if the left MCA fills antegrade or there are collaterals which bypass this severe stenosis.  The caliber of the left M2 and M3 MCA vessels appear normal.  There is no MCA branch occlusion observed.  The basilar artery is widely patent with the vertebrals codominant. No cerebellar branch occlusion is seen.  Compared with angiography, a similar appearance is noted.   Review of the MIP images confirms the above findings.  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  See discussion above. Formal catheter angiogram could be helpful in further quantification.  New hypodensity located at the genu of the internal capsule on the left, suspect acute 5 mm medial lenticulostriate territory infarct, not present 2012.   Original Report Authenticated By: Davonna Belling, M.D.     Assessment/Plan TIAs Known critical (L)MCA stenosis For arteriogram, risks discussed. Consent signed in chart  Brayton El PA-C 11/19/2012, 2:00 PM

## 2012-11-19 NOTE — Progress Notes (Signed)
Physical Therapy Treatment Patient Details Name: [name] MRN: [id number] DOB: 1955-12-23 Today's Date: 11/19/2012 Time: 0102-7253 PT Time Calculation (min): 49 min  PT Assessment / Plan / Recommendation Comments on Treatment Session  Pt with no c/o dizziness or light headedness during session.  Assisted pt to restroom and upon assisting pt off of 3in1 in restroom, noted that pt was slow to respond, somewhat dysphasic and R hand noted to be weak and unable to grip RW.  Had pt sit back down on 3in1, notified RN and took BP.  BP was 116/49.  Pt then able to respond appropriately, therefore pulled recliner into bathroom and assisted pt to chair.      Follow Up Recommendations  Home health PT     Does the patient have the potential to tolerate intense rehabilitation     Barriers to Discharge        Equipment Recommendations  None recommended by PT    Recommendations for Other Services    Frequency 7X/week   Plan Discharge plan remains appropriate    Precautions / Restrictions Precautions Precautions: Knee Required Braces or Orthoses: Knee Immobilizer - Left Knee Immobilizer - Left: Discontinue once straight leg raise with < 10 degree lag Restrictions Weight Bearing Restrictions: No Other Position/Activity Restrictions: WBAT   Pertinent Vitals/Pain 6/10 pain, pt premedicated.     Mobility  Bed Mobility Bed Mobility: Supine to Sit;Sitting - Scoot to Edge of Bed Supine to Sit: 4: Min assist Sitting - Scoot to [place] of Bed: 4: Min assist Details for Bed Mobility Assistance: Assist for LLE out of bed with max cues for hand placement on bed instead of trapeze to better simulate home environment and prevent shoulder injury.  Transfers Transfers: Sit to Stand;Stand to Sit Sit to Stand: 4: Min guard;4: Min assist;From elevated surface;With upper extremity assist;From bed;From chair/3-in-1 Stand to Sit: 4: Min guard;4: Min assist;With upper extremity assist;With armrests;To  chair/3-in-1 Details for Transfer Assistance: Assist to rise and steady with cues for hand placement and LE management.  Some assist for LLE when sitting.  Ambulation/Gait Ambulation/Gait Assistance: 4: Min guard Ambulation Distance (Feet): 15 Feet Assistive device: Rolling walker Ambulation/Gait Assistance Details: Cues for upright posture, sequencing/technique.  Gait Pattern: Step-to pattern    Exercises     PT Diagnosis:    PT Problem List:   PT Treatment Interventions:     PT Goals Acute Rehab PT Goals PT Goal Formulation: With patient Time For Goal Achievement: 11/20/12 Potential to Achieve Goals: Good Pt will go Supine/Side to Sit: with supervision PT Goal: Supine/Side to Sit - Progress: Progressing toward goal Pt will go Sit to Stand: with supervision PT Goal: Sit to Stand - Progress: Progressing toward goal Pt will go Stand to Sit: with supervision PT Goal: Stand to Sit - Progress: Progressing toward goal Pt will Ambulate: 51 - 150 feet PT Goal: Ambulate - Progress: Progressing toward goal  Visit Information  Last PT Received On: 11/19/12 Assistance Needed: +2 (safety due to recent TIA/hypotension)    Subjective Data  Subjective: I'm feel ok Patient Stated Goal: Resume previous lifestyle with decreased pain   Cognition  Overall Cognitive Status: Appears within functional limits for tasks assessed/performed Arousal/Alertness: Awake/alert Orientation Level: Appears intact for tasks assessed Behavior During Session: [facility] for tasks performed    Balance     End of Session PT - End of Session Equipment Utilized During Treatment: Left knee immobilizer Activity Tolerance: Patient limited by fatigue;Other (comment) (neuro episode) Patient left:  in chair;with call bell/phone within reach;with family/visitor present Nurse Communication: Mobility status (neuro findings) CPM Left Knee CPM Left Knee: Off   GP     Page, Meribeth Mattes 11/19/2012, 9:34 AM

## 2012-11-19 NOTE — Procedures (Signed)
S.P 4 vessel cerebral arteriogram  R%T CFA approach . Findings . 1. 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

## 2012-11-19 NOTE — Progress Notes (Signed)
OT Cancellation Note  Patient Details Name: [name] MRN: [id number] DOB: 10-07-1956   Cancelled Treatment:  OT Orders received and chart reviewed, for eval/tx for shower. Reason Eval/Treat Not Completed: Medical issues which prohibited therapy, pt with episode of orthostatic hypotension during PT eval. RN requested that OT hold eval/shower ADL at this time. Will reassess as able and medically appropriate. Roselie Awkward Dixon 11/19/2012, 9:14 AM

## 2012-11-19 NOTE — Progress Notes (Signed)
  Echocardiogram 2D Echocardiogram has been performed.  [name] 11/19/2012, 11:24 AM

## 2012-11-19 NOTE — Progress Notes (Signed)
Subjective: Patient experienced a recurrent episode of speech difficulty and right side weakness earlier today, lasting about 5 minutes. Symptom onset was associated with standing from a seated position, similar to circumstances that occurred yesterday. 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 It was unclear unclear whether there was progression of left MCA stenosis compared to studies one year ago. She's currently on aspirin and Plavix. Systolic blood pressure was 116 at the time of her symptoms today.  Objective: Current vital signs: BP 123/61  Pulse 70  Temp 98.9 F (37.2 C) (Oral)  Resp 18  Ht 5\' 10"  (1.778 m)  Wt 104.327 kg (230 lb)  BMI 33.00 kg/m2  SpO2 98%  Neurologic Exam: Alert and in no acute distress. His mental status was normal. There was no receptive or expressive aphasia. Speech was normal. No facial weakness. Equal movement and strength of extremities.  Lab Results: Results for orders placed during the hospital encounter of 11/16/12 (from the past 48 hour(s))  CBC     Status: Abnormal   Collection Time   11/18/12  5:00 AM      Component Value Range Comment   WBC 12.6 (*) 4.0 - 10.5 K/uL    RBC 3.48 (*) 4.22 - 5.81 MIL/uL    Hemoglobin 10.0 (*) 13.0 - 17.0 g/dL    HCT 57.8 (*) 46.9 - 52.0 %    MCV 83.9  78.0 - 100.0 fL    MCH 28.7  26.0 - 34.0 pg    MCHC 34.2  30.0 - 36.0 g/dL    RDW 62.9  52.8 - 41.3 %    Platelets 176  150 - 400 K/uL   HEMOGLOBIN A1C     Status: Normal   Collection Time   11/18/12 11:30 AM      Component Value Range Comment   Hemoglobin A1C 5.4  <5.7 %    Mean Plasma Glucose 108  <117 mg/dL   LIPID PANEL     Status: Abnormal   Collection Time   11/18/12 11:35 AM      Component Value Range Comment   Cholesterol 103  0 - 200 mg/dL    Triglycerides 244 (*) <150 mg/dL    HDL 31 (*) >01 mg/dL    Total CHOL/HDL Ratio 3.3      VLDL 33  0 - 40 mg/dL    LDL  Cholesterol 39  0 - 99 mg/dL   CBC     Status: Abnormal   Collection Time   11/19/12  4:43 AM      Component Value Range Comment   WBC 11.3 (*) 4.0 - 10.5 K/uL    RBC 3.24 (*) 4.22 - 5.81 MIL/uL    Hemoglobin 9.0 (*) 13.0 - 17.0 g/dL    HCT 02.7 (*) 25.3 - 52.0 %    MCV 82.7  78.0 - 100.0 fL    MCH 27.8  26.0 - 34.0 pg    MCHC 33.6  30.0 - 36.0 g/dL    RDW 66.4  40.3 - 47.4 %    Platelets 177  150 - 400 K/uL     Studies/Results: Ct Angio Head W/cm &/or Wo Cm  11/18/2012  *RADIOLOGY REPORT*  Clinical Data:  Suspected subcortical TIA involving the left MCA territory.  Normal neurologic exam.  Prior history of stroke. Plavix recently discontinued due to knee surgery.  CT ANGIOGRAPHY HEAD AND NECK  Technique:  Multidetector CT  imaging of the head and neck was performed using the standard protocol during bolus administration of intravenous contrast.  Multiplanar CT image reconstructions including MIPs were obtained to evaluate the vascular anatomy. Carotid stenosis measurements (when applicable) are obtained utilizing NASCET criteria, using the distal internal carotid diameter as the denominator.  Contrast:  100 ml Omnipaque 350.  Comparison:  Formal catheter angiogram 11/03/2011.  CTA NECK  Findings:  There is no significant atheromatous change of the transverse arch or great vessel origins.  Conventional branching pattern.  No pneumothorax or lung apex mass.  No thyroid lesions. No significant adenopathy.  Moderate cervical spondylosis.  There is a shallow calcific plaque at the left internal carotid artery origin. There is no measurable stenosis.  There is no soft plaque or ulceration.  There is no dissection or fibromuscular dysplasia.  There is nonstenotic calcific plaque at the right internal carotid artery origin without ulceration or soft plaque. There is no measurable stenosis.  There is no dissection or fibromuscular dysplasia.  Both vertebral arteries are patent with the without significant  ostial stenosis.  There equal in size.   Review of the MIP images confirms the above findings.  IMPRESSION: Nonstenotic calcific plaque at both internal carotid artery origins.  There is good general agreement with prior catheter angiogram.  CTA HEAD  Findings:  There is no evidence for intracranial hemorrhage, mass lesion, hydrocephalus, or extra-axial fluid.  There is no atrophy. Mild chronic microvascular ischemic changes are suggested in the periventricular and subcortical white matter.  There is a new 5 mm hypodensity involving the left internal capsule genu (image 14 series 16) not present previously.  I suspect this is an acute medial lenticulostriate infarct.  Post infusion, no abnormal intracranial enhancement.  Calvarium intact.  Moderate sinus disease.  No mastoid fluid.  Right internal carotid artery demonstrates a 50% calcific stenosis at the cavernous/supraclinoid segment.  This is similar to prior catheter angiogram.  Right A1 segment hypoplastic.  Right middle cerebral artery normal.  Normal left internal carotid artery through the skull base and supraclinoid segments but except for slight nonstenotic lateral cavernous calcification.  Both anterior cerebral arteries fill from the left.  There is a severe pre-occlusive narrowing of the left middle cerebral artery M1 segment (images 99 - 100 series 605).  It is unclear to me if the left MCA fills antegrade or there are collaterals which bypass this severe stenosis.  The caliber of the left M2 and M3 MCA vessels appear normal.  There is no MCA branch occlusion observed.  The basilar artery is widely patent with the vertebrals codominant. No cerebellar branch occlusion is seen.  Compared with angiography, a similar appearance is noted.   Review of the MIP images confirms the above findings.  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  See discussion above. Formal catheter angiogram could be helpful in further quantification.  New  hypodensity located at the genu of the internal capsule on the left, suspect acute 5 mm medial lenticulostriate territory infarct, not present 2012.   Original Report Authenticated By: Davonna Belling, M.D.    Ct Angio Neck W/cm &/or Wo/cm  11/18/2012  *RADIOLOGY REPORT*  Clinical Data:  Suspected subcortical TIA involving the left MCA territory.  Normal neurologic exam.  Prior history of stroke. Plavix recently discontinued due to knee surgery.  CT ANGIOGRAPHY HEAD AND NECK  Technique:  Multidetector CT imaging of the head and neck was performed using the standard protocol during bolus administration of  intravenous contrast.  Multiplanar CT image reconstructions including MIPs were obtained to evaluate the vascular anatomy. Carotid stenosis measurements (when applicable) are obtained utilizing NASCET criteria, using the distal internal carotid diameter as the denominator.  Contrast:  100 ml Omnipaque 350.  Comparison:  Formal catheter angiogram 11/03/2011.  CTA NECK  Findings:  There is no significant atheromatous change of the transverse arch or great vessel origins.  Conventional branching pattern.  No pneumothorax or lung apex mass.  No thyroid lesions. No significant adenopathy.  Moderate cervical spondylosis.  There is a shallow calcific plaque at the left internal carotid artery origin. There is no measurable stenosis.  There is no soft plaque or ulceration.  There is no dissection or fibromuscular dysplasia.  There is nonstenotic calcific plaque at the right internal carotid artery origin without ulceration or soft plaque. There is no measurable stenosis.  There is no dissection or fibromuscular dysplasia.  Both vertebral arteries are patent with the without significant ostial stenosis.  There equal in size.   Review of the MIP images confirms the above findings.  IMPRESSION: Nonstenotic calcific plaque at both internal carotid artery origins.  There is good general agreement with prior catheter angiogram.   CTA HEAD  Findings:  There is no evidence for intracranial hemorrhage, mass lesion, hydrocephalus, or extra-axial fluid.  There is no atrophy. Mild chronic microvascular ischemic changes are suggested in the periventricular and subcortical white matter.  There is a new 5 mm hypodensity involving the left internal capsule genu (image 14 series 16) not present previously.  I suspect this is an acute medial lenticulostriate infarct.  Post infusion, no abnormal intracranial enhancement.  Calvarium intact.  Moderate sinus disease.  No mastoid fluid.  Right internal carotid artery demonstrates a 50% calcific stenosis at the cavernous/supraclinoid segment.  This is similar to prior catheter angiogram.  Right A1 segment hypoplastic.  Right middle cerebral artery normal.  Normal left internal carotid artery through the skull base and supraclinoid segments but except for slight nonstenotic lateral cavernous calcification.  Both anterior cerebral arteries fill from the left.  There is a severe pre-occlusive narrowing of the left middle cerebral artery M1 segment (images 99 - 100 series 605).  It is unclear to me if the left MCA fills antegrade or there are collaterals which bypass this severe stenosis.  The caliber of the left M2 and M3 MCA vessels appear normal.  There is no MCA branch occlusion observed.  The basilar artery is widely patent with the vertebrals codominant. No cerebellar branch occlusion is seen.  Compared with angiography, a similar appearance is noted.   Review of the MIP images confirms the above findings.  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  See discussion above. Formal catheter angiogram could be helpful in further quantification.  New hypodensity located at the genu of the internal capsule on the left, suspect acute 5 mm medial lenticulostriate territory infarct, not present 2012.   Original Report Authenticated By: Davonna Belling, M.D.     Medications:  Scheduled:   .  sodium chloride   Intravenous Once  . aspirin EC  81 mg Oral Daily  . atorvastatin  80 mg Oral q1800  . citalopram  10 mg Oral q morning - 10a  . clopidogrel  75 mg Oral Q breakfast  . docusate sodium  100 mg Oral BID  . levETIRAcetam  250 mg Oral BID  . OxyCODONE  20 mg Oral Q12H   ZOX:[id number], acetaminophen, alum & mag  hydroxide-simeth, diphenhydrAMINE, HYDROmorphone (DILAUDID) injection, menthol-cetylpyridinium, methocarbamol (ROBAXIN) IV, methocarbamol, metoCLOPramide (REGLAN) injection, metoCLOPramide, ondansetron (ZOFRAN) IV, ondansetron, oxyCODONE, phenol, zolpidem  Assessment/Plan: Probable transient ischemic attack, likely associated with hypoperfusion involving left middle cerebral artery territory with positional change and reduced cerebral blood flow. Patient has severe stenosis of left MCA. Progression of stenosis over the past one year is unclear. Patient however is symptomatic in left MCA territory at this point, and is on Plavix and aspirin currently.  Plan: Patient will be transferred to [facility] for further evaluation and management including interventional radiology evaluation with cerebral angiogram, and possible subsequent angioplasty. He will continue with aspirin and Plavix for now.  C.R. Roseanne Reno, MD [facility] [phone]  11/19/2012  1:48 PM

## 2012-11-19 NOTE — Consult Note (Signed)
[facility] Medical Consultation  [name] YNW:[id number] DOB: 1956-03-15 DOA: 11/16/2012 PCP: Warrick Parisian, MD   Requesting physician: Stewart/Blackman Date of consultation: 11/19/12 Reason for consultation: medical management  Impression/Recommendations Dysarthria/right hemiparesis -CTA head on 11/18/2012 revealed severe preocclusion of the proximal M1 portion of the left MCA; also new hypodensity in the genu of the left internal capsule suggestive of new lenticulostriate infarct -Patient transferred to [facility] for intracerebral angiogram with possible angioplasty -modify cardiovascular risk factors -Continue ASA/Plavix per neuro--->pt was taken off ASA 6 months ago and only took plavix -plavix stopped 5 days before surgery, restarted 11/17/12 -Further anticoagulation per stroke service -await results repeat echocardiogram -Echocardiogram on 11/03/2003 showed EF 55-60%, grade 2 diastolic dysfunction -Hemoglobin A1c 5.4 -Check morning labs, TSH -EKG Hypertension -Patient has been off of his AZOR since admission on 11/16/2012 -If indeed the patient has been deemed to have new infarct, would allow for permissive hypertension for 24 hours Dyslipidemia -Patient on Crestor 40 mg outpatient -Continue Lipitor 80 milligrams daily in house - LDL 39 CKD stage II -Renal function appears to be at baseline -Baseline creatinine 1.1-1.4 Depression -Continue Celexa questionable history of seizure -Continue Keppra 250 twice a day Postoperative day #3 left TKA Constipation -colace and senna for now OSA -CPAP at night--settings-->10/3 mmH20   I will followup again tomorrow. Please contact me if I can be of assistance in the meanwhile. Thank you for this consultation.    Chief Complaint: Speech disturbance, right hemiparesis  HPI:  56 year old male with a history of hypertension, obstructive sleep apnea, hyperlipidemia, renal cell carcinoma status post nephrectomy,  depression, and intracranial stenosis was admitted to [facility] on 11/16/12, and he underwent elective left total knee arthroplasty performed by Dr. Magnus Ivan. The patient did not have any immediate postoperative complications. Unfortunately, on 11/18/2012, the patient had a transient episode of expressive aphasia with right upper extremity weakness. Neurology, Dr. Noel Christmas was consulted to see the patient. Stroke workup was undertaken. The patient had a CTA of the neck as well as CT of the brain. The CT of the brain revealed severe preocclusion of the proximal M1 segment of the left middle cerebral artery as well as a new hypodensity in the genu of the left internal capsule. Echocardiogram was performed, but is pending results at this time. This morning, the patient had recurrent episode of dysarthria and right upper extremity weakness lasting approximately 5 minutes. A decision was made to transfer the patient to [facility] for interventional radiology to perform a cerebral angiogram and possible angioplasty of his intracranial stenosis as the patient has symptomatology associated with reduced cerebral blood flow in the left MCA territory. Dr. Noel Christmas continues to follow the patient. He recommended continuation of aspirin and Plavix for now. Currently, the patient denies any fevers, chills, chest pain, shortness breath, nausea, vomiting, diarrhea, abdominal pain, dysuria, rashes,, syncope, visual changes, headache.  Of note, the patient had similar symptomatology on 11/02/2011. In that time, the patient had a negative MRI of the brain for stroke. However MRI did suggested Decreased signal intensity within the left MCA branch vessels.  This suggested decreased flow to the left MCA vessels. The patient underwent cerebral angiogram at that time which revealed a severe preocclusion of the left MCA at the origin with decreased antegrade flow. He was placed on heparin drip at that time. In  addition, the patient was sent home with Keppra due to concerns of seizure which contribute to the symptomatology.   Review of  Systems:  Constitutional:  No weight loss, night sweats, Fevers, chills, fatigue.  Head&Eyes: No headache.  No vision loss.  No eye pain or scotoma ENT:  No Difficulty swallowing,Tooth/dental problems,Sore throat,   Cardio-vascular:  No chest pain, Orthopnea, PND, swelling in lower extremities,  dizziness, palpitations  GI:  No heartburn, indigestion, abdominal pain, nausea, vomiting, diarrhea,  hematochezia, melena Resp:  No shortness of breath with exertion or at rest. No excess mucus, no productive cough, No non-productive cough Skin:  no rash or lesions.  GU:  no dysuria, change in color of urine, no urgency or frequency. No flank pain.  Musculoskeletal:  No joint pain or swelling. No decreased range of motion. No back pain.  Psych:  No change in mood or affect. No depression or anxiety. Neurologic: No headache, no dysesthesia, no focal weakness, no vision loss. No syncope   Past Medical History  Diagnosis Date  . Renal cell carcinoma   . Hypertension   . Sleep apnea     CPAP  . High cholesterol   . Arthritis   . Shortness of breath     uses cpap  . Stroke 11/03/2011    TIA  . Depression   . Seizures    Past Surgical History  Procedure Date  . Nephrectomy   . Carpal tunnel release   . Knee arthroscopy   . Anterior cruciate ligament repair   . Biceps tendon repair   . Total knee arthroplasty 11/16/2012    Procedure: TOTAL KNEE ARTHROPLASTY;  Surgeon: Kathryne Hitch, MD;  Location: [facility] ORS;  Service: Orthopedics;  Laterality: Left;  Left Total Knee Arthroplasty   Social History:  reports that he quit smoking about 23 years ago. His smoking use included Cigarettes. He quit after 2 years of use. He has never used smokeless tobacco. He reports that he drinks alcohol. He reports that he does not use illicit drugs.  Family History   Problem Relation Age of Onset  . Coronary artery disease Father     No Known Allergies   Prior to Admission medications   Medication Sig Start Date End Date Taking? Authorizing Provider  amLODipine-olmesartan (AZOR) 5-40 MG per tablet Take 1 tablet by mouth every morning.    Yes Historical Provider, MD  citalopram (CELEXA) 10 MG tablet Take 10 mg by mouth every morning.   Yes Historical Provider, MD  Coenzyme Q10 (CO Q-10) 200 MG CAPS Take 200 mg by mouth.   Yes Historical Provider, MD  levETIRAcetam (KEPPRA) 250 MG tablet Take 250 mg by mouth 2 (two) times daily.   Yes Historical Provider, MD  rosuvastatin (CRESTOR) 40 MG tablet Take 40 mg by mouth every morning.   Yes Historical Provider, MD  zolpidem (AMBIEN) 5 MG tablet Take 5 mg by mouth at bedtime as needed.   Yes Historical Provider, MD  clopidogrel (PLAVIX) 75 MG tablet Take 75 mg by mouth daily.    Historical Provider, MD  Colesevelam HCl 3.75 G PACK Take 3.75 g by mouth at bedtime.     Historical Provider, MD  fish oil-omega-3 fatty acids 1000 MG capsule Take 1 g by mouth daily.      Historical Provider, MD  pyridoxine (B-6) 200 MG tablet Take 200 mg by mouth daily.     Historical Provider, MD    Physical Exam: Filed Vitals:   11/18/12 1600 11/18/12 2105 11/19/12 0541 11/19/12 1243  BP: 106/59 113/51 124/53 123/61  Pulse: 63 68 70   Temp: 97.9 F (  36.6 C) 99.4 F (37.4 C) 98.9 F (37.2 C)   TempSrc: Oral Oral Oral   Resp: 18 18 18    Height:      Weight:      SpO2: 98% 97% 98%    General:  A&O x 3, NAD, nontoxic, pleasant/cooperative Head/Eye: No conjunctival hemorrhage, no icterus, [place]/AT, No nystagmus ENT:  No icterus,  No thrush, good dentition, no pharyngeal exudate Neck:  No masses, no lymphadenpathy, no bruits CV:  RRR, no rub, no gallop, no S3 Lung:  CTAB, good air movement, no wheeze, no rhonchi Abdomen: soft/NT, +BS, nondistended, no peritoneal signs Ext: No cyanosis, No rashes, No petechiae, No  lymphangitis, No edema Neuro: CNII-XII intact, strength 4/5 in bilateral upper and lower extremities, no dysmetria  Labs on Admission:  Basic Metabolic Panel:  Lab 11/17/12 1610 11/13/12 1400  NA 134* 138  K 4.1 4.2  CL 99 105  CO2 28 23  GLUCOSE 115* 82  BUN 13 21  CREATININE 1.14 1.27  CALCIUM 8.3* 9.8  MG -- --  PHOS -- --   Liver Function Tests: No results found for this basename: AST:5,ALT:5,ALKPHOS:5,BILITOT:5,PROT:5,ALBUMIN:5 in the last 168 hours No results found for this basename: LIPASE:5,AMYLASE:5 in the last 168 hours No results found for this basename: AMMONIA:5 in the last 168 hours CBC:  Lab 11/19/12 0443 11/18/12 0500 11/17/12 0453 11/13/12 1400  WBC 11.3* 12.6* 12.5* 7.7  NEUTROABS -- -- -- --  HGB 9.0* 10.0* 10.4* 13.2  HCT 26.8* 29.2* 31.2* 37.8*  MCV 82.7 83.9 83.4 82.4  PLT 177 176 192 223   Cardiac Enzymes: No results found for this basename: CKTOTAL:5,CKMB:5,CKMBINDEX:5,TROPONINI:5 in the last 168 hours BNP: No components found with this basename: POCBNP:5 CBG: No results found for this basename: GLUCAP:5 in the last 168 hours  Radiological Exams on Admission: Ct Angio Head W/cm &/or Wo Cm  11/18/2012  *RADIOLOGY REPORT*  Clinical Data:  Suspected subcortical TIA involving the left MCA territory.  Normal neurologic exam.  Prior history of stroke. Plavix recently discontinued due to knee surgery.  CT ANGIOGRAPHY HEAD AND NECK  Technique:  Multidetector CT imaging of the head and neck was performed using the standard protocol during bolus administration of intravenous contrast.  Multiplanar CT image reconstructions including MIPs were obtained to evaluate the vascular anatomy. Carotid stenosis measurements (when applicable) are obtained utilizing NASCET criteria, using the distal internal carotid diameter as the denominator.  Contrast:  100 ml Omnipaque 350.  Comparison:  Formal catheter angiogram 11/03/2011.  CTA NECK  Findings:  There is no significant  atheromatous change of the transverse arch or great vessel origins.  Conventional branching pattern.  No pneumothorax or lung apex mass.  No thyroid lesions. No significant adenopathy.  Moderate cervical spondylosis.  There is a shallow calcific plaque at the left internal carotid artery origin. There is no measurable stenosis.  There is no soft plaque or ulceration.  There is no dissection or fibromuscular dysplasia.  There is nonstenotic calcific plaque at the right internal carotid artery origin without ulceration or soft plaque. There is no measurable stenosis.  There is no dissection or fibromuscular dysplasia.  Both vertebral arteries are patent with the without significant ostial stenosis.  There equal in size.   Review of the MIP images confirms the above findings.  IMPRESSION: Nonstenotic calcific plaque at both internal carotid artery origins.  There is good general agreement with prior catheter angiogram.  CTA HEAD  Findings:  There is no evidence for  intracranial hemorrhage, mass lesion, hydrocephalus, or extra-axial fluid.  There is no atrophy. Mild chronic microvascular ischemic changes are suggested in the periventricular and subcortical white matter.  There is a new 5 mm hypodensity involving the left internal capsule genu (image 14 series 16) not present previously.  I suspect this is an acute medial lenticulostriate infarct.  Post infusion, no abnormal intracranial enhancement.  Calvarium intact.  Moderate sinus disease.  No mastoid fluid.  Right internal carotid artery demonstrates a 50% calcific stenosis at the cavernous/supraclinoid segment.  This is similar to prior catheter angiogram.  Right A1 segment hypoplastic.  Right middle cerebral artery normal.  Normal left internal carotid artery through the skull base and supraclinoid segments but except for slight nonstenotic lateral cavernous calcification.  Both anterior cerebral arteries fill from the left.  There is a severe pre-occlusive  narrowing of the left middle cerebral artery M1 segment (images 99 - 100 series 605).  It is unclear to me if the left MCA fills antegrade or there are collaterals which bypass this severe stenosis.  The caliber of the left M2 and M3 MCA vessels appear normal.  There is no MCA branch occlusion observed.  The basilar artery is widely patent with the vertebrals codominant. No cerebellar branch occlusion is seen.  Compared with angiography, a similar appearance is noted.   Review of the MIP images confirms the above findings.  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  See discussion above. Formal catheter angiogram could be helpful in further quantification.  New hypodensity located at the genu of the internal capsule on the left, suspect acute 5 mm medial lenticulostriate territory infarct, not present 2012.   Original Report Authenticated By: Davonna Belling, M.D.    Ct Angio Neck W/cm &/or Wo/cm  11/18/2012  *RADIOLOGY REPORT*  Clinical Data:  Suspected subcortical TIA involving the left MCA territory.  Normal neurologic exam.  Prior history of stroke. Plavix recently discontinued due to knee surgery.  CT ANGIOGRAPHY HEAD AND NECK  Technique:  Multidetector CT imaging of the head and neck was performed using the standard protocol during bolus administration of intravenous contrast.  Multiplanar CT image reconstructions including MIPs were obtained to evaluate the vascular anatomy. Carotid stenosis measurements (when applicable) are obtained utilizing NASCET criteria, using the distal internal carotid diameter as the denominator.  Contrast:  100 ml Omnipaque 350.  Comparison:  Formal catheter angiogram 11/03/2011.  CTA NECK  Findings:  There is no significant atheromatous change of the transverse arch or great vessel origins.  Conventional branching pattern.  No pneumothorax or lung apex mass.  No thyroid lesions. No significant adenopathy.  Moderate cervical spondylosis.  There is a shallow  calcific plaque at the left internal carotid artery origin. There is no measurable stenosis.  There is no soft plaque or ulceration.  There is no dissection or fibromuscular dysplasia.  There is nonstenotic calcific plaque at the right internal carotid artery origin without ulceration or soft plaque. There is no measurable stenosis.  There is no dissection or fibromuscular dysplasia.  Both vertebral arteries are patent with the without significant ostial stenosis.  There equal in size.   Review of the MIP images confirms the above findings.  IMPRESSION: Nonstenotic calcific plaque at both internal carotid artery origins.  There is good general agreement with prior catheter angiogram.  CTA HEAD  Findings:  There is no evidence for intracranial hemorrhage, mass lesion, hydrocephalus, or extra-axial fluid.  There is no atrophy. Mild chronic microvascular  ischemic changes are suggested in the periventricular and subcortical white matter.  There is a new 5 mm hypodensity involving the left internal capsule genu (image 14 series 16) not present previously.  I suspect this is an acute medial lenticulostriate infarct.  Post infusion, no abnormal intracranial enhancement.  Calvarium intact.  Moderate sinus disease.  No mastoid fluid.  Right internal carotid artery demonstrates a 50% calcific stenosis at the cavernous/supraclinoid segment.  This is similar to prior catheter angiogram.  Right A1 segment hypoplastic.  Right middle cerebral artery normal.  Normal left internal carotid artery through the skull base and supraclinoid segments but except for slight nonstenotic lateral cavernous calcification.  Both anterior cerebral arteries fill from the left.  There is a severe pre-occlusive narrowing of the left middle cerebral artery M1 segment (images 99 - 100 series 605).  It is unclear to me if the left MCA fills antegrade or there are collaterals which bypass this severe stenosis.  The caliber of the left M2 and M3 MCA  vessels appear normal.  There is no MCA branch occlusion observed.  The basilar artery is widely patent with the vertebrals codominant. No cerebellar branch occlusion is seen.  Compared with angiography, a similar appearance is noted.   Review of the MIP images confirms the above findings.  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  See discussion above. Formal catheter angiogram could be helpful in further quantification.  New hypodensity located at the genu of the internal capsule on the left, suspect acute 5 mm medial lenticulostriate territory infarct, not present 2012.   Original Report Authenticated By: Davonna Belling, M.D.     EKG: Independently reviewed. pending  Time spent: 70 min  Macario Shear [facility] Pager [phone]  If 7PM-7AM, please contact night-coverage [email or web address] Password [email or web address] 11/19/2012, 2:35 PM

## 2012-11-19 NOTE — Progress Notes (Signed)
Utilization review completed.  

## 2012-11-20 ENCOUNTER — Inpatient Hospital Stay (HOSPITAL_COMMUNITY): Payer: 59

## 2012-11-20 DIAGNOSIS — Z8673 Personal history of transient ischemic attack (TIA), and cerebral infarction without residual deficits: Secondary | ICD-10-CM

## 2012-11-20 DIAGNOSIS — E860 Dehydration: Secondary | ICD-10-CM

## 2012-11-20 DIAGNOSIS — G40209 Localization-related (focal) (partial) symptomatic epilepsy and epileptic syndromes with complex partial seizures, not intractable, without status epilepticus: Secondary | ICD-10-CM

## 2012-11-20 DIAGNOSIS — N182 Chronic kidney disease, stage 2 (mild): Secondary | ICD-10-CM | POA: Diagnosis present

## 2012-11-20 DIAGNOSIS — D62 Acute posthemorrhagic anemia: Secondary | ICD-10-CM | POA: Diagnosis present

## 2012-11-20 DIAGNOSIS — I951 Orthostatic hypotension: Secondary | ICD-10-CM | POA: Diagnosis present

## 2012-11-20 DIAGNOSIS — G40909 Epilepsy, unspecified, not intractable, without status epilepticus: Secondary | ICD-10-CM | POA: Diagnosis present

## 2012-11-20 LAB — BASIC METABOLIC PANEL
Chloride: 101 mEq/L (ref 96–112)
Creatinine, Ser: 1.63 mg/dL — ABNORMAL HIGH (ref 0.50–1.35)
GFR calc Af Amer: 53 mL/min — ABNORMAL LOW (ref >90)
Potassium: 3.8 mEq/L (ref 3.5–5.1)
Sodium: 135 mEq/L (ref 135–145)

## 2012-11-20 LAB — COMPREHENSIVE METABOLIC PANEL
ALT: 9 U/L (ref 0–53)
AST: 14 U/L (ref 0–37)
Albumin: 2.7 g/dL — ABNORMAL LOW (ref 3.5–5.2)
Alkaline Phosphatase: 67 U/L (ref 39–117)
Glucose, Bld: 104 mg/dL — ABNORMAL HIGH (ref 70–99)
Potassium: 3.7 mEq/L (ref 3.5–5.1)
Sodium: 137 mEq/L (ref 135–145)
Total Protein: 5.9 g/dL — ABNORMAL LOW (ref 6.0–8.3)

## 2012-11-20 LAB — CBC
HCT: 26 % — ABNORMAL LOW (ref 39.0–52.0)
Hemoglobin: 8.9 g/dL — ABNORMAL LOW (ref 13.0–17.0)
MCH: 28.4 pg (ref 26.0–34.0)
MCHC: 34.2 g/dL (ref 30.0–36.0)
MCV: 83.1 fL (ref 78.0–100.0)
RDW: 13.2 % (ref 11.5–15.5)

## 2012-11-20 LAB — TSH: TSH: 3.714 u[IU]/mL (ref 0.350–4.500)

## 2012-11-20 MED ORDER — SODIUM CHLORIDE 0.9 % IV SOLN
INTRAVENOUS | Status: DC
  Administered 2012-11-20 – 2012-11-21 (×4): via INTRAVENOUS

## 2012-11-20 MED ORDER — SODIUM CHLORIDE 0.9 % IV BOLUS (SEPSIS)
500.0000 mL | Freq: Once | INTRAVENOUS | Status: AC
  Administered 2012-11-20: 500 mL via INTRAVENOUS

## 2012-11-20 MED ORDER — ENOXAPARIN SODIUM 40 MG/0.4ML ~~LOC~~ SOLN
40.0000 mg | SUBCUTANEOUS | Status: DC
  Administered 2012-11-20: 40 mg via SUBCUTANEOUS
  Filled 2012-11-20 (×2): qty 0.4

## 2012-11-20 NOTE — Progress Notes (Signed)
EEG completed.

## 2012-11-20 NOTE — Progress Notes (Signed)
Orthopedic Tech Progress Note Patient Details:  [name] 10-Jun-1956 [id number] Put on cpm at 8:51 @ 0-54 degrees. Patient ID: [name], male   DOB: [birth year], 56 y.o.   MRN: [id number]   [name] 11/20/2012, 8:51 PM

## 2012-11-20 NOTE — Progress Notes (Signed)
Patient ID: [name], male   DOB: 1956/10/25, 56 y.o.   MRN: [id number] No acute changes overnight.  AF/VSS this am.  WBC down to normal.  Hgb stable at 8.9, but creatinine is up to 1.89.  This is above his baseline, and may be a reflection of the contrast dye given during his radiologic interventional procedure yesterday.  Will attempt to mobilize more today and resume CPM.  Will check Cr again later today.  If still up, may give one unit of blood.

## 2012-11-20 NOTE — Consult Note (Signed)
[facility] Consult Note [place] TEAM 1 - Stepdown/ICU TEAM   [name] ZOX:[id number] DOB: 1956/11/30 DOA: 11/16/2012 PCP: Warrick Parisian, MD  Brief narrative: 56 year old male patient with multiple medical problems including hypertension and history of prior CVA in 2012. Also history of partial seizures on Keppra prior to admission. Was admitted to [facility] on 11/16/2012 for elective total knee surgery. Was off Plavix for short period of time free to surgery. On 11/18/2012 patient had a transient episode of expressive aphasia and right upper extremity weakness x2 occurrences both concurrent with mobilization. Neurology was consulted CT angiography of head and brain were completed that revealed severe pre-occlusion of the proximal M1 segment of the left middle cerebral artery. Patient was subsequently transferred to [facility] where he underwent cerebral angiogram by interventional radiology to evaluate the middle cerebral artery stenosis seen on CT angios. The angioedema reveal left MCA occlusion.   Assessment/Plan: Principal Problem:  *Arthritis of knee, left- post left total knee arthroplasty *Per attending/ primary physician orthopedic team  Active Problems:  Middle cerebral artery stenosis with occlusion/Transient ischemic attack *Appreciate neurology assistance * symptoms lasted a few minutes and occurred on 2 consecutive days upon just getting up with PT *It is felt that patient's transient ischemic symptoms were secondary to dehydration and resultant postural hypotension in a patient with a left MCA occlusion -  *Plan is to correct underlying causes such as dehydration and potentially anemia *Recommend to continue aspirin 81 mg and Plavix 75 mg daily for secondary stroke prevention   Orthostasis/Dehydration *We'll give 500 cc normal saline bolus today and then begin IV fluids at 125 cc per hour *Check orthostatic vitals before mobilization   ? Acute blood loss anemia *Baseline hemoglobin appears to be between 12 and 13 yet current hemoglobin is down to 8.9 *Unclear etiology since review of operative intake and output reveals only a 50 cc EBL *We'll repeat CBC in a.m. after hydration *If unexplained anemia persists may benefit from an anemia panel and stools for blood especially in setting of patient requiring antiplatelet medications   CKD (chronic kidney disease) stage 2, GFR 60-89 ml/min *BUN and creatinine have increased and likely his combination of underlying dehydration as well as recent IV contrast for arteriogram *Follow electrolytes   Seizure disorder-partial *Continue Keppra-patient reported to neurologist excessive daytime sleepiness and neurologist is considering decreasing his nocturnal dosage-EEG is pending    Hyperlipidemia *Continue statin   Depression *Continue citalopram   History of CVA (cerebrovascular accident) November 2012 *Symptoms are actually more consistent with current symptoms of TIA with quick resolution   DVT prophylaxis: Lovenox Code Status: Full Family Communication: Spoke with patient and wife Disposition Plan: At discretion of primary team. From a medical standpoint appropriate to transfer out of the step down - wife is reluctant to have patient transferred given multiple transfers between units and hospitals  Consultants: [facility] Neurology Interventional radiology  Procedures: Left total knee arthroplasty on 11/16/2012 Cerebral angiogram on 11/20/1999  Antibiotics: Ancef 12/13 >>> 12/13  HPI/Subjective: Patient alert and oriented and complaining of expected surgical site discomfort. No further episodes of extremity weakness or aphasia reported.   Objective: Blood pressure 119/68, pulse 64, temperature 99.1 F (37.3 C), temperature source Oral, resp. rate 16, height 5\' 10"  (1.778 m), weight 104.327 kg (230 lb), SpO2 95.00%.  Intake/Output Summary (Last 24  hours) at 11/20/12 1258 Last data filed at 11/20/12 0800  Gross per 24 hour  Intake   1215 ml  Output   1150 ml  Net     65 ml     Exam: General: No acute respiratory distress Lungs: Clear to auscultation bilaterally without wheezes or crackles Cardiovascular: Regular rate and rhythm without murmur gallop or rub normal S1 and S2 Abdomen: Nontender, nondistended, soft, bowel sounds positive, no rebound, no ascites, no appreciable mass Musculoskeletal: No significant cyanosis, clubbing of bilateral lower extremities-expected edema left knee postoperative but incision clean dry intact Neurological: Patient is alert and oriented x3, moves all extremities x4, cranial nerve 2-12 are grossly intact  Data Reviewed: Basic Metabolic Panel:  Lab 11/20/12 4098 11/17/12 0453 11/13/12 1400  NA 137 134* 138  K 3.7 4.1 4.2  CL 102 99 105  CO2 26 28 23   GLUCOSE 104* 115* 82  BUN 27* 13 21  CREATININE 1.89* 1.14 1.27  CALCIUM 8.4 8.3* 9.8  MG -- -- --  PHOS -- -- --   Liver Function Tests:  Lab 11/20/12 0415  AST 14  ALT 9  ALKPHOS 67  BILITOT 0.3  PROT 5.9*  ALBUMIN 2.7*   No results found for this basename: LIPASE:5,AMYLASE:5 in the last 168 hours No results found for this basename: AMMONIA:5 in the last 168 hours CBC:  Lab 11/20/12 0415 11/19/12 0443 11/18/12 0500 11/17/12 0453 11/13/12 1400  WBC 9.2 11.3* 12.6* 12.5* 7.7  NEUTROABS -- -- -- -- --  HGB 8.9* 9.0* 10.0* 10.4* 13.2  HCT 26.0* 26.8* 29.2* 31.2* 37.8*  MCV 83.1 82.7 83.9 83.4 82.4  PLT 205 177 176 192 223   Cardiac Enzymes: No results found for this basename: CKTOTAL:5,CKMB:5,CKMBINDEX:5,TROPONINI:5 in the last 168 hours BNP (last 3 results) No results found for this basename: PROBNP:3 in the last 8760 hours CBG: No results found for this basename: GLUCAP:5 in the last 168 hours  Recent Results (from the past 240 hour(s))  SURGICAL PCR SCREEN     Status: Normal   Collection Time   11/13/12  1:43 PM       Component Value Range Status Comment   MRSA, PCR NEGATIVE  NEGATIVE Final    Staphylococcus aureus NEGATIVE  NEGATIVE Final      Studies:  Recent x-ray studies have been reviewed in detail by the Attending Physician  Scheduled Meds:  Reviewed in detail by the Attending Physician   Junious Silk, ANP [facility] Office  [phone] Pager [phone]  On-Call/Text Page:      [email or web address]      password [email or web address]  If 7PM-7AM, please contact night-coverage [email or web address] Password [email or web address] 11/20/2012, 12:58 PM   LOS: 4 days   I have examined the above patient, reviewed the chart and modified the note which I agree with.   Calvert Cantor, MD [phone]

## 2012-11-20 NOTE — Progress Notes (Signed)
Stroke Team Progress Note  HISTORY [name] is an 56 y.o. male with a history of stroke and November 2012, hypertension, hyperlipidemia and partial seizures, who experienced a transient episode of expressive aphasia and right upper extremity weakness earlier today. He is status post left total knee replacement 2 days ago. He has been on Plavix for stroke prevention. Plavix was discontinued one week prior to surgery, and was resumed on 11/17/2012. Symptoms completely resolved with no recurrence. There was no associated salty taste in his mouth at the time of his symptoms this morning.  LSN: 6:00 AM 11/18/12 tPA Given: No: Rapid resolution of symptoms  He had another ep[isode y`day am while standing up insetting of orthostasis. 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  Marland Kitchen He was admitted to the neuro stepdown for further evaluation and treatment.  SUBJECTIVE His wife is at the bedside.  Overall he feels his condition is  unchanged OBJECTIVE Most recent Vital Signs: Filed Vitals:   11/19/12 2323 11/19/12 2325 11/20/12 0316 11/20/12 0800  BP:  120/61 133/62 119/68  Pulse:  72 71 64  Temp: 98.4 F (36.9 C)  98.1 F (36.7 C) 99.1 F (37.3 C)  TempSrc: Oral  Oral Oral  Resp:  18 20 16   Height:      Weight:      SpO2:  99% 92% 95%   CBG (last 3)  No results found for this basename: GLUCAP:3 in the last 72 hours  IV Fluid Intake:     . sodium chloride      MEDICATIONS    . sodium chloride   Intravenous Once  . aspirin EC  81 mg Oral Daily  . atorvastatin  80 mg Oral q1800  . citalopram  10 mg Oral q morning - 10a  . clopidogrel  75 mg Oral Q breakfast  . docusate sodium  100 mg Oral BID  . levETIRAcetam  250 mg Oral BID  . OxyCODONE  20 mg Oral Q12H  . senna  2 tablet Oral Daily   PRN:  acetaminophen, acetaminophen, alum & mag hydroxide-simeth, diphenhydrAMINE, HYDROmorphone (DILAUDID) injection, menthol-cetylpyridinium, methocarbamol (ROBAXIN)  IV, methocarbamol, metoCLOPramide (REGLAN) injection, metoCLOPramide, morphine injection, ondansetron (ZOFRAN) IV, ondansetron, oxyCODONE, phenol, zolpidem  Diet:  General   Activity:  Bedrest   DVT Prophylaxis:  none   CLINICALLY SIGNIFICANT STUDIES Basic Metabolic Panel:  Lab 11/20/12 1610 11/17/12 0453  NA 137 134*  K 3.7 4.1  CL 102 99  CO2 26 28  GLUCOSE 104* 115*  BUN 27* 13  CREATININE 1.89* 1.14  CALCIUM 8.4 8.3*  MG -- --  PHOS -- --   Liver Function Tests:  Lab 11/20/12 0415  AST 14  ALT 9  ALKPHOS 67  BILITOT 0.3  PROT 5.9*  ALBUMIN 2.7*   CBC:  Lab 11/20/12 0415 11/19/12 0443  WBC 9.2 11.3*  NEUTROABS -- --  HGB 8.9* 9.0*  HCT 26.0* 26.8*  MCV 83.1 82.7  PLT 205 177   Coagulation:  Lab 11/13/12 1400  LABPROT 13.1  INR 1.00   Cardiac Enzymes: No results found for this basename: CKTOTAL:3,CKMB:3,CKMBINDEX:3,TROPONINI:3 in the last 168 hours Urinalysis:  Lab 11/13/12 1345  COLORURINE YELLOW  LABSPEC 1.023  PHURINE 5.0  GLUCOSEU NEGATIVE  HGBUR NEGATIVE  BILIRUBINUR NEGATIVE  KETONESUR NEGATIVE  PROTEINUR NEGATIVE  UROBILINOGEN 0.2  NITRITE NEGATIVE  LEUKOCYTESUR NEGATIVE   Lipid Panel    Component Value Date/Time   CHOL 103 11/18/2012 1135   TRIG  166* 11/18/2012 1135   HDL 31* 11/18/2012 1135   CHOLHDL 3.3 11/18/2012 1135   VLDL 33 11/18/2012 1135   LDLCALC 39 11/18/2012 1135   HgbA1C  Lab Results  Component Value Date   HGBA1C 5.4 11/18/2012    Urine Drug Screen:   No results found for this basename: labopia, cocainscrnur, labbenz, amphetmu, thcu, labbarb    Alcohol Level: No results found for this basename: ETH:2 in the last 168 hours  Ct Angio Head W/cm &/or Wo Cm  11/18/2012  *RADIOLOGY REPORT*  Clinical Data:  Suspected subcortical TIA involving the left MCA territory.  Normal neurologic exam.  Prior history of stroke. Plavix recently discontinued due to knee surgery.  CT ANGIOGRAPHY HEAD AND NECK  Technique:   Multidetector CT imaging of the head and neck was performed using the standard protocol during bolus administration of intravenous contrast.  Multiplanar CT image reconstructions including MIPs were obtained to evaluate the vascular anatomy. Carotid stenosis measurements (when applicable) are obtained utilizing NASCET criteria, using the distal internal carotid diameter as the denominator.  Contrast:  100 ml Omnipaque 350.  Comparison:  Formal catheter angiogram 11/03/2011.  CTA NECK  Findings:  There is no significant atheromatous change of the transverse arch or great vessel origins.  Conventional branching pattern.  No pneumothorax or lung apex mass.  No thyroid lesions. No significant adenopathy.  Moderate cervical spondylosis.  There is a shallow calcific plaque at the left internal carotid artery origin. There is no measurable stenosis.  There is no soft plaque or ulceration.  There is no dissection or fibromuscular dysplasia.  There is nonstenotic calcific plaque at the right internal carotid artery origin without ulceration or soft plaque. There is no measurable stenosis.  There is no dissection or fibromuscular dysplasia.  Both vertebral arteries are patent with the without significant ostial stenosis.  There equal in size.   Review of the MIP images confirms the above findings.  IMPRESSION: Nonstenotic calcific plaque at both internal carotid artery origins.  There is good general agreement with prior catheter angiogram.  CTA HEAD  Findings:  There is no evidence for intracranial hemorrhage, mass lesion, hydrocephalus, or extra-axial fluid.  There is no atrophy. Mild chronic microvascular ischemic changes are suggested in the periventricular and subcortical white matter.  There is a new 5 mm hypodensity involving the left internal capsule genu (image 14 series 16) not present previously.  I suspect this is an acute medial lenticulostriate infarct.  Post infusion, no abnormal intracranial enhancement.   Calvarium intact.  Moderate sinus disease.  No mastoid fluid.  Right internal carotid artery demonstrates a 50% calcific stenosis at the cavernous/supraclinoid segment.  This is similar to prior catheter angiogram.  Right A1 segment hypoplastic.  Right middle cerebral artery normal.  Normal left internal carotid artery through the skull base and supraclinoid segments but except for slight nonstenotic lateral cavernous calcification.  Both anterior cerebral arteries fill from the left.  There is a severe pre-occlusive narrowing of the left middle cerebral artery M1 segment (images 99 - 100 series 605).  It is unclear to me if the left MCA fills antegrade or there are collaterals which bypass this severe stenosis.  The caliber of the left M2 and M3 MCA vessels appear normal.  There is no MCA branch occlusion observed.  The basilar artery is widely patent with the vertebrals codominant. No cerebellar branch occlusion is seen.  Compared with angiography, a similar appearance is noted.   Review of the MIP  images confirms the above findings.  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  See discussion above. Formal catheter angiogram could be helpful in further quantification.  New hypodensity located at the genu of the internal capsule on the left, suspect acute 5 mm medial lenticulostriate territory infarct, not present 2012.   Original Report Authenticated By: Davonna Belling, M.D.    Ct Angio Neck W/cm &/or Wo/cm  11/18/2012  *RADIOLOGY REPORT*  Clinical Data:  Suspected subcortical TIA involving the left MCA territory.  Normal neurologic exam.  Prior history of stroke. Plavix recently discontinued due to knee surgery.  CT ANGIOGRAPHY HEAD AND NECK  Technique:  Multidetector CT imaging of the head and neck was performed using the standard protocol during bolus administration of intravenous contrast.  Multiplanar CT image reconstructions including MIPs were obtained to evaluate the vascular anatomy.  Carotid stenosis measurements (when applicable) are obtained utilizing NASCET criteria, using the distal internal carotid diameter as the denominator.  Contrast:  100 ml Omnipaque 350.  Comparison:  Formal catheter angiogram 11/03/2011.  CTA NECK  Findings:  There is no significant atheromatous change of the transverse arch or great vessel origins.  Conventional branching pattern.  No pneumothorax or lung apex mass.  No thyroid lesions. No significant adenopathy.  Moderate cervical spondylosis.  There is a shallow calcific plaque at the left internal carotid artery origin. There is no measurable stenosis.  There is no soft plaque or ulceration.  There is no dissection or fibromuscular dysplasia.  There is nonstenotic calcific plaque at the right internal carotid artery origin without ulceration or soft plaque. There is no measurable stenosis.  There is no dissection or fibromuscular dysplasia.  Both vertebral arteries are patent with the without significant ostial stenosis.  There equal in size.   Review of the MIP images confirms the above findings.  IMPRESSION: Nonstenotic calcific plaque at both internal carotid artery origins.  There is good general agreement with prior catheter angiogram.  CTA HEAD  Findings:  There is no evidence for intracranial hemorrhage, mass lesion, hydrocephalus, or extra-axial fluid.  There is no atrophy. Mild chronic microvascular ischemic changes are suggested in the periventricular and subcortical white matter.  There is a new 5 mm hypodensity involving the left internal capsule genu (image 14 series 16) not present previously.  I suspect this is an acute medial lenticulostriate infarct.  Post infusion, no abnormal intracranial enhancement.  Calvarium intact.  Moderate sinus disease.  No mastoid fluid.  Right internal carotid artery demonstrates a 50% calcific stenosis at the cavernous/supraclinoid segment.  This is similar to prior catheter angiogram.  Right A1 segment hypoplastic.   Right middle cerebral artery normal.  Normal left internal carotid artery through the skull base and supraclinoid segments but except for slight nonstenotic lateral cavernous calcification.  Both anterior cerebral arteries fill from the left.  There is a severe pre-occlusive narrowing of the left middle cerebral artery M1 segment (images 99 - 100 series 605).  It is unclear to me if the left MCA fills antegrade or there are collaterals which bypass this severe stenosis.  The caliber of the left M2 and M3 MCA vessels appear normal.  There is no MCA branch occlusion observed.  The basilar artery is widely patent with the vertebrals codominant. No cerebellar branch occlusion is seen.  Compared with angiography, a similar appearance is noted.   Review of the MIP images confirms the above findings.  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  See discussion above. Formal catheter angiogram could be helpful in further quantification.  New hypodensity located at the genu of the internal capsule on the left, suspect acute 5 mm medial lenticulostriate territory infarct, not present 2012.   Original Report Authenticated By: Davonna Belling, M.D.        MRI of the brain  Unable to do due to surgical clips     2D Echocardiogram  Left ventricle: The cavity size was normal. There was mild concentric hypertrophy. Systolic function was normal. The estimated ejection fraction was in the range of 55% to 60%. Although no diagnostic regional wall motion abnormality was identified, this possibility cannot be completely excluded on the basis of this study.    Carotid Doppler  Not done as cerebral angio done  CXR    EKG  Otherwise normal ECG since last tracing no significant change   Therapy Recommendations  pending  Physical Exam  Pleasant middle aged caucasian male not in distress.left knee swollen and has surgical dressing from knee replacement.Awake alert. Afebrile. Head is nontraumatic. Neck  is supple without bruit. Hearing is normal. Cardiac exam no murmur or gallop. Lungs are clear to auscultation. Distal pulses are well felt.  Neurological Exam ; Awake  Alert oriented x 3. Normal speech and language.eye movements full without nystagmus. Face symmetric. Tongue midline. Normal strength, tone, reflexes and coordination on right side and left upper extremity and limited LLE exam due to recent knee surgery,pain and swelling.. Normal sensation. Gait deferred.  ASSESSMENT [name] is a 56 y.o. male presenting with recurrent left hemispheric TIAs-hemodynamic in setting of left MCA occlusion with dehydration, postural hypotension and anemia .MRI cannot be done due to surgical staples.Work up complete On plavix prior to admission. Now on aspirin and plavix for secondary stroke prevention. Patient with no resultant deficits.     Hospital day # 4  TREATMENT/PLAN  Continue aspirin 81 mg and plavix 75 mg daily for secondary stroke prevention.  Orthostatic tolerance exercises. Aggressive hydration and optimize hematocrit.  D/w wife and Dr Edger House, MD Medical Director [facility] Pager: [phone] 11/20/2012 10:36 AM

## 2012-11-20 NOTE — Progress Notes (Signed)
Patient put on his bipap tonight and is tolerating bipap well at this time. RT will continue to monitor.

## 2012-11-20 NOTE — Progress Notes (Signed)
Physical Therapy Progress Note      11/20/12 1500  PT Visit Information  Last PT Received On 11/20/12  Assistance Needed +2  PT Time Calculation  PT Start Time 1500  PT Stop Time 1530  PT Time Calculation (min) 30 min  Subjective Data  Subjective "I'm doing ok."  Patient Stated Goal To return home  Precautions  Precautions Knee;Fall  Required Braces or Orthoses Knee Immobilizer - Left  Knee Immobilizer - Left Discontinue once straight leg raise with < 10 degree lag  Restrictions  Weight Bearing Restrictions Yes  LLE Weight Bearing WBAT  Cognition  Overall Cognitive Status Appears within functional limits for tasks assessed/performed  Arousal/Alertness Awake/alert  Orientation Level Appears intact for tasks assessed  Behavior During Session [facility] for tasks performed  Bed Mobility  Bed Mobility Not assessed  Transfers  Transfers Sit to Stand;Stand to Sit  Sit to Stand 4: Min assist;With armrests;From chair/3-in-1  Stand to Sit 4: Min assist;To chair/3-in-1  Details for Transfer Assistance (A) to initiate transfer and slowly descend with (A) to extend LLE in front.    Ambulation/Gait  Ambulation/Gait Assistance 4: Min assist  Ambulation Distance (Feet) 50 Feet  Assistive device Rolling walker  Ambulation/Gait Assistance Details Pt needs max cues for proper step sequence, upright posture, proper weight shift and decrease weight through UE.  Manual cues for RW placement and body position within RW  Gait Pattern Step-to pattern  Stairs No  Modified Rankin (Stroke Patients Only)  Pre-Morbid Rankin Score 0  Modified Rankin 3  Exercises  Exercises Total Joint  Total Joint Exercises  Ankle Circles/Pumps AROM;Supine;Both;20 reps  Quad Sets AROM;Both;Supine;20 reps  Heel Slides 10 reps;Left;AAROM  PT - End of Session  Equipment Utilized During Treatment Left knee immobilizer  Activity Tolerance Patient limited by pain  Patient left in chair;with call bell/phone within  reach;with family/visitor present;with nursing in room  Nurse Communication Mobility status  PT - Assessment/Plan  Comments on Treatment Session Pt able to increase ambulation distance however continues to need step by step instructions for proper gait.  Pt conitnues to need (A) with transfers and having difficulty with LLE activiation.  PT Plan Discharge plan remains appropriate;Frequency remains appropriate  PT Frequency 7X/week  Follow Up Recommendations Home health PT  PT equipment None recommended by PT  Acute Rehab PT Goals  PT Goal Formulation With patient  Time For Goal Achievement 12/04/12  Potential to Achieve Goals Good  Pt will go Supine/Side to Sit with supervision  PT Goal: Supine/Side to Sit - Progress Progressing toward goal  Pt will go Sit to Stand with supervision  PT Goal: Sit to Stand - Progress Progressing toward goal  Pt will go Stand to Sit with supervision  PT Goal: Stand to Sit - Progress Progressing toward goal  Pt will Ambulate 51 - 150 feet;with supervision;with rolling walker  PT Goal: Ambulate - Progress Progressing toward goal  PT General Charges  $$ ACUTE PT VISIT 1 Procedure  PT Treatments  $Gait Training 8-22 mins  $Therapeutic Activity 8-22 mins    C/o 4/10 left knee pain, continues to c/o mild right ankle pain but does not rate  [place], PT DPT [phone]

## 2012-11-20 NOTE — Progress Notes (Signed)
Orthostatic VS: lying: HR-75, BP-130/63; sitting: HR 78, BP-127/67; standing: HR-88, BP-127/77.

## 2012-11-20 NOTE — Progress Notes (Signed)
Patient ID: [name], male   DOB: 12-29-1955, 56 y.o.   MRN: [id number]   Cerebral arteriogram 12/16  See report  Rt groin NT; no bleeding No hematoma Rt foot 2+ pulses   Call us if need [place]

## 2012-11-20 NOTE — Evaluation (Signed)
Physical Therapy RE-Evaluation Patient Details Name: [name] MRN: [id number] DOB: 1956/11/18 Today's Date: 11/20/2012 Time: 1125-1150 PT Time Calculation (min): 25 min  PT Assessment / Plan / Recommendation Clinical Impression  Pt is 56 y/o male admitted to [facility] for left TKA on 11/16/12.  Pt was transferred to [facility] due to slurred speech and right UE weakness. Per MD probable transient ischemic attack, likely associated with hypoperfusion involving left middle cerebral artery territory with positional change and reduced cerebral blood flow. Patient has severe stenosis of left MCA. Progression of stenosis over the past one year is unclear. Patient however is symptomatic in left MCA territory at this point, and is on Plavix and aspirin currently.  Pt will benefit from continued PT services to improve overall mobility. Will continue to follow progression and determine best d/c plans.  Pt may need further therapy prior to d/c home depending on progress made.      PT Assessment  Patient needs continued PT services    Follow Up Recommendations  Home health PT (may need to update depending on progress made)    Does the patient have the potential to tolerate intense rehabilitation      Barriers to Discharge None      Equipment Recommendations  None recommended by PT    Recommendations for Other Services OT consult   Frequency 7X/week    Precautions / Restrictions Precautions Precautions: Knee;Fall Required Braces or Orthoses: Knee Immobilizer - Left Knee Immobilizer - Left: Discontinue once straight leg raise with < 10 degree lag Restrictions Weight Bearing Restrictions: Yes LLE Weight Bearing: Weight bearing as tolerated   Pertinent Vitals/Pain C/o right ankle pain and left knee pain 5/10; premedicated  Orthostatic BPs  Supine 130/63  Sitting 127/67     Standing 122/77            Mobility  Bed Mobility Bed Mobility: Supine to Sit;Sitting - Scoot to Edge of  Bed Supine to Sit: 3: Mod assist Sitting - Scoot to Edge of Bed: 3: Mod assist Sit to Supine: 3: Mod assist Details for Bed Mobility Assistance: (A) with LLE OOB and cues for proper technique.  Pt needed (A) to elevate trunk OOB. Transfers Transfers: Sit to Stand;Stand to Sit Sit to Stand: 1: +2 Total assist;From bed Sit to Stand: Patient Percentage: 50% Stand to Sit: 1: +2 Total assist;To bed Stand to Sit: Patient Percentage: 60% Details for Transfer Assistance: +2 (A) to initiate transfer and slowly descend with cues for hand placement.   Ambulation/Gait Ambulation/Gait Assistance: 1: +2 Total assist Ambulation/Gait: Patient Percentage: 60% Ambulation Distance (Feet): 5 Feet (side steps) Assistive device: Rolling walker Ambulation/Gait Assistance Details: +2 (A) to maintain balance with cues for proper step sequence.  Pt reports increase difficulty accepting weight through right LE and c/o right ankle pain. Gait Pattern: Step-to pattern Modified Rankin (Stroke Patients Only) Pre-Morbid Rankin Score: No symptoms Modified Rankin: Moderately severe disability    Shoulder Instructions     Exercises     PT Diagnosis: Difficulty walking  PT Problem List: Decreased strength;Decreased range of motion;Decreased activity tolerance;Decreased mobility;Decreased knowledge of use of DME;Pain;Decreased knowledge of precautions PT Treatment Interventions: DME instruction;Gait training;Stair training;Functional mobility training;Therapeutic activities;Therapeutic exercise;Patient/family education   PT Goals Acute Rehab PT Goals PT Goal Formulation: With patient Time For Goal Achievement: 12/04/12 Potential to Achieve Goals: Good Pt will go Supine/Side to Sit: with supervision PT Goal: Supine/Side to Sit - Progress: Goal set today Pt will go Sit to Supine/Side: with supervision  PT Goal: Sit to Supine/Side - Progress: Goal set today Pt will go Sit to Stand: with supervision PT Goal: Sit to  Stand - Progress: Goal set today Pt will go Stand to Sit: with supervision PT Goal: Stand to Sit - Progress: Goal set today Pt will Ambulate: 51 - 150 feet;with supervision;with rolling walker PT Goal: Ambulate - Progress: Goal set today Pt will Go Up / Down Stairs: Flight;with min assist;with least restrictive assistive device PT Goal: Up/Down Stairs - Progress: Goal set today  Visit Information  Last PT Received On: 11/20/12 Assistance Needed: +2    Subjective Data  Subjective: "I'll try to get up." Patient Stated Goal: To return home   Prior Functioning  Home Living Lives With: Spouse Available Help at Discharge: Family Type of Home: House Home Access: Stairs to enter Secretary/administrator of Steps: 14 Entrance Stairs-Rails: Right Home Layout: One level Home Adaptive Equipment: Walker - rolling Additional Comments: Pt's wife states she can get RW  Prior Function Level of Independence: Independent Able to Take Stairs?: Yes Driving: Yes Vocation: Full time employment    Cognition  Overall Cognitive Status: Appears within functional limits for tasks assessed/performed Arousal/Alertness: Awake/alert Orientation Level: Appears intact for tasks assessed Behavior During Session: [facility] for tasks performed    Extremity/Trunk Assessment Right Lower Extremity Assessment RLE ROM/Strength/Tone: [facility] for tasks assessed Left Lower Extremity Assessment LLE ROM/Strength/Tone: Deficits LLE ROM/Strength/Tone Deficits: Limited AAROM flexion 5-45 degrees   Balance    End of Session PT - End of Session Equipment Utilized During Treatment: Left knee immobilizer Activity Tolerance: Patient limited by pain (technician present to perform EEG) Patient left: in chair;with call bell/phone within reach;with family/visitor present;with nursing in room Nurse Communication: Mobility status CPM Left Knee CPM Left Knee: Off  GP     Joyel Chenette 11/20/2012, 1:50 PM Jake Shark, PT  DPT [phone]

## 2012-11-20 NOTE — Progress Notes (Signed)
Orthopedic Tech Progress Note Patient Details:  [name] 1956-11-18 [id number] OHF applied to bed. Patient ID: [name], male   DOB: 06/05/1956, 56 y.o.   MRN: [id number]   Orie Rout 11/20/2012, 11:13 AM

## 2012-11-21 ENCOUNTER — Inpatient Hospital Stay (HOSPITAL_COMMUNITY): Payer: 59

## 2012-11-21 LAB — CBC
Hemoglobin: 8.1 g/dL — ABNORMAL LOW (ref 13.0–17.0)
MCV: 82.8 fL (ref 78.0–100.0)
Platelets: 211 10*3/uL (ref 150–400)
RBC: 2.85 MIL/uL — ABNORMAL LOW (ref 4.22–5.81)
WBC: 8.5 10*3/uL (ref 4.0–10.5)

## 2012-11-21 LAB — BASIC METABOLIC PANEL
CO2: 24 mEq/L (ref 19–32)
Calcium: 8.4 mg/dL (ref 8.4–10.5)
Chloride: 103 mEq/L (ref 96–112)
Potassium: 4.2 mEq/L (ref 3.5–5.1)
Sodium: 136 mEq/L (ref 135–145)

## 2012-11-21 MED ORDER — FERROUS SULFATE 325 (65 FE) MG PO TABS: 325.0000 mg | ORAL_TABLET | Freq: Three times a day (TID) | ORAL | Status: DC

## 2012-11-21 MED ORDER — BISACODYL 10 MG RE SUPP
10.0000 mg | Freq: Every day | RECTAL | Status: DC | PRN
  Administered 2012-11-21: 10 mg via RECTAL
  Filled 2012-11-21 (×2): qty 1

## 2012-11-21 MED ORDER — METHOCARBAMOL 500 MG PO TABS: 500.0000 mg | ORAL_TABLET | Freq: Four times a day (QID) | ORAL | Status: DC | PRN

## 2012-11-21 MED ORDER — OXYCODONE-ACETAMINOPHEN 5-325 MG PO TABS: 1.0000 | ORAL_TABLET | ORAL | Status: DC | PRN

## 2012-11-21 MED ORDER — FERROUS SULFATE 325 (65 FE) MG PO TABS
325.0000 mg | ORAL_TABLET | Freq: Three times a day (TID) | ORAL | Status: DC
  Administered 2012-11-21: 325 mg via ORAL
  Filled 2012-11-21 (×3): qty 1

## 2012-11-21 MED ORDER — ASPIRIN 81 MG PO TBEC: 81.0000 mg | DELAYED_RELEASE_TABLET | Freq: Every day | ORAL | Status: DC

## 2012-11-21 NOTE — Discharge Summary (Signed)
Patient ID: [name] MRN: [id number] DOB/AGE: [birth year] 56 y.o.  Admit date: 11/16/2012 Discharge date: 11/21/2012  Admission Diagnoses:  Principal Problem:  *Arthritis of knee, left- post arthroscopy Active Problems:  Middle cerebral artery stenosis with occlusion  Transient ischemic attack  Hyperlipidemia  Depression  ? Acute blood loss anemia  Orthostasis  CKD (chronic kidney disease) stage 2, GFR 60-89 ml/min  Seizure disorder-partial  Dehydration  History of CVA (cerebrovascular accident)   Discharge Diagnoses:  Same  Past Medical History  Diagnosis Date  . Renal cell carcinoma   . Hypertension   . Sleep apnea     CPAP  . High cholesterol   . Arthritis   . Shortness of breath     uses cpap  . Stroke 11/03/2011    TIA  . Depression   . Seizures     Surgeries: Procedure(s): TOTAL KNEE ARTHROPLASTY on 11/16/2012   Consultants: Treatment Team:  Catarina Hartshorn, MD Md Stroke, MD Calvert Cantor, MD  Discharged Condition: Improved  Hospital Course: [name] is an 56 y.o. male who was admitted 11/16/2012 for operative treatment ofArthritis of knee, left. Patient has severe unremitting pain that affects sleep, daily activities, and work/hobbies. After pre-op clearance the patient was taken to the operating room on 11/16/2012 and underwent  Procedure(s): TOTAL KNEE ARTHROPLASTY.    Patient was given perioperative antibiotics: Anti-infectives     Start     Dose/Rate Route Frequency Ordered Stop   11/16/12 1400   ceFAZolin (ANCEF) IVPB 1 g/50 mL premix        1 g 100 mL/hr over 30 Minutes Intravenous Every 6 hours 11/16/12 1130 11/16/12 2125   11/16/12 0525   ceFAZolin (ANCEF) IVPB 2 g/50 mL premix        2 g 100 mL/hr over 30 Minutes Intravenous 60 min pre-op 11/16/12 0525 11/16/12 0740           Patient was given sequential compression devices, early ambulation, and chemoprophylaxis to prevent DVT.  Due to several "TIA" episodes, neurology  was consulted and a CTA of the brain was obtained as well as a cardiac echo.  He then underwent an interventional radiologic procedure and cath which showed a complete occlusion of his left MCA.  The treatment includes aspirin and plavix for now.  After therapy and no recurrent episodes, it was felt he could be discharged safely to home.    Recent vital signs: Patient Vitals for the past 24 hrs:  BP Temp Temp src Pulse Resp SpO2  11/21/12 1126 - - - 72  - -  11/21/12 0800 128/76 mmHg 98.3 F (36.8 C) Oral 66  14  100 %  11/21/12 0400 140/64 mmHg 97.8 F (36.6 C) Oral 69  16  99 %  11/20/12 2320 136/71 mmHg 98.7 F (37.1 C) Oral 74  16  98 %  11/20/12 2241 - - - 68  14  98 %  11/20/12 1910 112/65 mmHg 98.6 F (37 C) Oral 70  14  97 %  11/20/12 1600 114/58 mmHg 98.4 F (36.9 C) Oral 66  12  98 %  11/20/12 1200 122/77 mmHg 98.4 F (36.9 C) Oral 66  13  98 %  11/20/12 1140 122/77 mmHg - - 78  14  -  11/20/12 1135 127/67 mmHg - - 73  16  98 %  11/20/12 1130 130/63 mmHg - - 71  15  100 %     Recent laboratory studies:  [facility] 11/21/12  0350 11/20/12 1349 11/20/12 0415  WBC 8.5 -- 9.2  HGB 8.1* -- 8.9*  HCT 23.6* -- 26.0*  PLT 211 -- 205  NA 136 135 --  K 4.2 3.8 --  CL 103 101 --  CO2 24 23 --  BUN 23 26* --  CREATININE 1.55* 1.63* --  GLUCOSE 98 162* --  INR -- -- --  CALCIUM 8.4 -- --     Discharge Medications:     Medication List     As of 11/21/2012 11:28 AM    TAKE these medications         aspirin 81 MG EC tablet   Take 1 tablet (81 mg total) by mouth daily.      AZOR 5-40 MG per tablet   Generic drug: amLODipine-olmesartan   Take 1 tablet by mouth every morning.      citalopram 10 MG tablet   Commonly known as: CELEXA   Take 10 mg by mouth every morning.      clopidogrel 75 MG tablet   Commonly known as: PLAVIX   Take 75 mg by mouth daily.      Co Q-10 200 MG Caps   Take 200 mg by mouth.      Colesevelam HCl 3.75 G Pack   Take 3.75 g by mouth at  bedtime.      ferrous sulfate 325 (65 FE) MG tablet   Take 1 tablet (325 mg total) by mouth 3 (three) times daily with meals.      fish oil-omega-3 fatty acids 1000 MG capsule   Take 1 g by mouth daily.      levETIRAcetam 250 MG tablet   Commonly known as: KEPPRA   Take 250 mg by mouth 2 (two) times daily.      methocarbamol 500 MG tablet   Commonly known as: ROBAXIN   Take 1 tablet (500 mg total) by mouth every 6 (six) hours as needed.      oxyCODONE-acetaminophen 5-325 MG per tablet   Commonly known as: PERCOCET/ROXICET   Take 1-2 tablets by mouth every 4 (four) hours as needed for pain.      pyridoxine 200 MG tablet   Commonly known as: B-6   Take 200 mg by mouth daily.      rosuvastatin 40 MG tablet   Commonly known as: CRESTOR   Take 40 mg by mouth every morning.      zolpidem 5 MG tablet   Commonly known as: AMBIEN   Take 5 mg by mouth at bedtime as needed.        Diagnostic Studies: Ct Angio Head W/cm &/or Wo Cm  11/18/2012  *RADIOLOGY REPORT*  Clinical Data:  Suspected subcortical TIA involving the left MCA territory.  Normal neurologic exam.  Prior history of stroke. Plavix recently discontinued due to knee surgery.  CT ANGIOGRAPHY HEAD AND NECK  Technique:  Multidetector CT imaging of the head and neck was performed using the standard protocol during bolus administration of intravenous contrast.  Multiplanar CT image reconstructions including MIPs were obtained to evaluate the vascular anatomy. Carotid stenosis measurements (when applicable) are obtained utilizing NASCET criteria, using the distal internal carotid diameter as the denominator.  Contrast:  100 ml Omnipaque 350.  Comparison:  Formal catheter angiogram 11/03/2011.  CTA NECK  Findings:  There is no significant atheromatous change of the transverse arch or great vessel origins.  Conventional branching pattern.  No pneumothorax or lung apex mass.  No thyroid lesions. No significant  adenopathy.  Moderate  cervical spondylosis.  There is a shallow calcific plaque at the left internal carotid artery origin. There is no measurable stenosis.  There is no soft plaque or ulceration.  There is no dissection or fibromuscular dysplasia.  There is nonstenotic calcific plaque at the right internal carotid artery origin without ulceration or soft plaque. There is no measurable stenosis.  There is no dissection or fibromuscular dysplasia.  Both vertebral arteries are patent with the without significant ostial stenosis.  There equal in size.   Review of the MIP images confirms the above findings.  IMPRESSION: Nonstenotic calcific plaque at both internal carotid artery origins.  There is good general agreement with prior catheter angiogram.  CTA HEAD  Findings:  There is no evidence for intracranial hemorrhage, mass lesion, hydrocephalus, or extra-axial fluid.  There is no atrophy. Mild chronic microvascular ischemic changes are suggested in the periventricular and subcortical white matter.  There is a new 5 mm hypodensity involving the left internal capsule genu (image 14 series 16) not present previously.  I suspect this is an acute medial lenticulostriate infarct.  Post infusion, no abnormal intracranial enhancement.  Calvarium intact.  Moderate sinus disease.  No mastoid fluid.  Right internal carotid artery demonstrates a 50% calcific stenosis at the cavernous/supraclinoid segment.  This is similar to prior catheter angiogram.  Right A1 segment hypoplastic.  Right middle cerebral artery normal.  Normal left internal carotid artery through the skull base and supraclinoid segments but except for slight nonstenotic lateral cavernous calcification.  Both anterior cerebral arteries fill from the left.  There is a severe pre-occlusive narrowing of the left middle cerebral artery M1 segment (images 99 - 100 series 605).  It is unclear to me if the left MCA fills antegrade or there are collaterals which bypass this severe stenosis.   The caliber of the left M2 and M3 MCA vessels appear normal.  There is no MCA branch occlusion observed.  The basilar artery is widely patent with the vertebrals codominant. No cerebellar branch occlusion is seen.  Compared with angiography, a similar appearance is noted.   Review of the MIP images confirms the above findings.  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  See discussion above. Formal catheter angiogram could be helpful in further quantification.  New hypodensity located at the genu of the internal capsule on the left, suspect acute 5 mm medial lenticulostriate territory infarct, not present 2012.   Original Report Authenticated By: Davonna Belling, M.D.    Ct Angio Neck W/cm &/or Wo/cm  11/18/2012  *RADIOLOGY REPORT*  Clinical Data:  Suspected subcortical TIA involving the left MCA territory.  Normal neurologic exam.  Prior history of stroke. Plavix recently discontinued due to knee surgery.  CT ANGIOGRAPHY HEAD AND NECK  Technique:  Multidetector CT imaging of the head and neck was performed using the standard protocol during bolus administration of intravenous contrast.  Multiplanar CT image reconstructions including MIPs were obtained to evaluate the vascular anatomy. Carotid stenosis measurements (when applicable) are obtained utilizing NASCET criteria, using the distal internal carotid diameter as the denominator.  Contrast:  100 ml Omnipaque 350.  Comparison:  Formal catheter angiogram 11/03/2011.  CTA NECK  Findings:  There is no significant atheromatous change of the transverse arch or great vessel origins.  Conventional branching pattern.  No pneumothorax or lung apex mass.  No thyroid lesions. No significant adenopathy.  Moderate cervical spondylosis.  There is a shallow calcific plaque at the left internal  carotid artery origin. There is no measurable stenosis.  There is no soft plaque or ulceration.  There is no dissection or fibromuscular dysplasia.  There is  nonstenotic calcific plaque at the right internal carotid artery origin without ulceration or soft plaque. There is no measurable stenosis.  There is no dissection or fibromuscular dysplasia.  Both vertebral arteries are patent with the without significant ostial stenosis.  There equal in size.   Review of the MIP images confirms the above findings.  IMPRESSION: Nonstenotic calcific plaque at both internal carotid artery origins.  There is good general agreement with prior catheter angiogram.  CTA HEAD  Findings:  There is no evidence for intracranial hemorrhage, mass lesion, hydrocephalus, or extra-axial fluid.  There is no atrophy. Mild chronic microvascular ischemic changes are suggested in the periventricular and subcortical white matter.  There is a new 5 mm hypodensity involving the left internal capsule genu (image 14 series 16) not present previously.  I suspect this is an acute medial lenticulostriate infarct.  Post infusion, no abnormal intracranial enhancement.  Calvarium intact.  Moderate sinus disease.  No mastoid fluid.  Right internal carotid artery demonstrates a 50% calcific stenosis at the cavernous/supraclinoid segment.  This is similar to prior catheter angiogram.  Right A1 segment hypoplastic.  Right middle cerebral artery normal.  Normal left internal carotid artery through the skull base and supraclinoid segments but except for slight nonstenotic lateral cavernous calcification.  Both anterior cerebral arteries fill from the left.  There is a severe pre-occlusive narrowing of the left middle cerebral artery M1 segment (images 99 - 100 series 605).  It is unclear to me if the left MCA fills antegrade or there are collaterals which bypass this severe stenosis.  The caliber of the left M2 and M3 MCA vessels appear normal.  There is no MCA branch occlusion observed.  The basilar artery is widely patent with the vertebrals codominant. No cerebellar branch occlusion is seen.  Compared with  angiography, a similar appearance is noted.   Review of the MIP images confirms the above findings.  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  See discussion above. Formal catheter angiogram could be helpful in further quantification.  New hypodensity located at the genu of the internal capsule on the left, suspect acute 5 mm medial lenticulostriate territory infarct, not present 2012.   Original Report Authenticated By: Davonna Belling, M.D.    Dg Knee Left Port  11/16/2012  *RADIOLOGY REPORT*  Clinical Data: Postop total knee  PORTABLE LEFT KNEE - 1-2 VIEW  Comparison: None.  Findings: Status post left knee arthroplasty.  Associated subcutaneous gas and a surgical drain.  Overlying skin staples.  No fracture is seen.  IMPRESSION: Satisfactory appearance status post left knee arthroplasty.   Original Report Authenticated By: Charline Bills, M.D.    Dg Ankle Right Port  11/21/2012  *RADIOLOGY REPORT*  Clinical Data: Ankle pain and swelling  PORTABLE RIGHT ANKLE - 2 VIEW  Comparison: None.  Findings: Two-view portable exam at 0758 hours shows no fracture. No evidence for gross dislocation.  Ankle mortise is preserved. There are degenerative changes in the tibia talar joint.  IMPRESSION: No acute bony findings.  Degenerative spurring in the tibiotalar joint.   Original Report Authenticated By: Kennith Center, M.D.     Disposition: 01-Home or Self Care      Discharge Orders    Future Orders Please Complete By Expires   Diet - low sodium heart healthy  Call MD / Call 911      Comments:   If you experience chest pain or shortness of breath, CALL 911 and be transported to the hospital emergency room.  If you develope a fever above 101 F, pus (white drainage) or increased drainage or redness at the wound, or calf pain, call your surgeon's office.   Constipation Prevention      Comments:   Drink plenty of fluids.  Prune juice may be helpful.  You may use a stool softener, such  as Colace (over the counter) 100 mg twice a day.  Use MiraLax (over the counter) for constipation as needed.   Increase activity slowly as tolerated      Discharge instructions      Comments:   You can get your current dressing wet in the shower on your left knee.  You can get your actual incision wet itself daily in the shower starting 12/20; then dry dressing as needed. Ice and elevation for swelling. CPM from 0 - 90 degrees for up to 2 hours twice daily. Full weight as tolerated. Get an over-the-counter stool softener. Check your blood pressure daily at home and resume your home blood pressure medication if pressure gets up to 140 systolic   Discharge patient         Follow-up Information    Follow up with Kathryne Hitch, MD. In 2 weeks.   Contact information:   [address] [place] [address] [phone]       Follow up with SETHI,PRAMODKUMAR P, MD. In 2 weeks.   Contact information:   [address] [facility] [place] [address] [phone]           Signed: Kathryne Hitch 11/21/2012, 11:28 AM

## 2012-11-21 NOTE — Progress Notes (Signed)
Patient ID: [name], male   DOB: [birth year], 56 y.o.   MRN: [id number] Continues to look good.  Will discharge to home this afternoon.

## 2012-11-21 NOTE — Progress Notes (Signed)
Physical Therapy Treatment Patient Details Name: [name] MRN: [id number] DOB: November 24, 1956 Today's Date: 11/21/2012 Time: 4098-1191 PT Time Calculation (min): 62 min  PT Assessment / Plan / Recommendation Comments on Treatment Session  Pt limited with ambulation secondary to continued right ankle pain.  Moves slowly due to this.  Assisted onto toilet and off as well.  Continues to have most difficulty with sit to stand and progressing due to bil LE pain.  Nursing notified that patient and wife concerned that MD check for gout.      Follow Up Recommendations  Home health PT;Supervision/Assistance - 24 hour         Barriers to Discharge  Pain right ankle      Equipment Recommendations  None recommended by PT    Recommendations for Other Services  None  Frequency 7X/week   Plan Discharge plan remains appropriate;Frequency remains appropriate    Precautions / Restrictions Precautions Precautions: Fall;Knee Required Braces or Orthoses: Knee Immobilizer - Left Knee Immobilizer - Left: Discontinue once straight leg raise with < 10 degree lag Restrictions Weight Bearing Restrictions: Yes LLE Weight Bearing: Weight bearing as tolerated   Pertinent Vitals/Pain VSS, some pain    Mobility  Bed Mobility Bed Mobility: Not assessed Transfers Transfers: Sit to Stand;Stand to Sit Sit to Stand: 1: +2 Total assist;With upper extremity assist;With armrests;From chair/3-in-1 Sit to Stand: Patient Percentage: 50% Stand to Sit: 1: +2 Total assist;With upper extremity assist;With armrests;To chair/3-in-1 Stand to Sit: Patient Percentage: 70% Details for Transfer Assistance: A needed for hand placement.  Difficulty getting up from low chair.  Placed pillows in chair for easier sit to stand next time.  Easier for pt to get off of 3N1 later  in session as it was higher.  Needs constant cues for proper foot placement as well.   Ambulation/Gait Ambulation/Gait Assistance: 4: Min  assist Ambulation Distance (Feet): 25 Feet Assistive device: Rolling walker Ambulation/Gait Assistance Details: Pt needed max cues for proper step sequence, upright posture and proper weight shift.  Pt overuses UEs due to pain in right ankle.  Manual cues for RW placement and body position in RW.  Also cued pt to take larger steps.  Took patient 25 minutes to ambulate 25 feet.   Gait Pattern: Step-to pattern;Decreased stride length;Trunk flexed Gait velocity: very slow General Gait Details: Pt flexes trunk with each step to change weight bearing on right ankle secondary to pain.   Stairs: No Wheelchair Mobility Wheelchair Mobility: No    Exercises Total Joint Exercises Ankle Circles/Pumps: AROM;Both;15 reps;Supine Quad Sets: AROM;Both;15 reps;Supine Heel Slides: AAROM;Left;5 reps;Supine Long Arc Quad: AAROM;Left;5 reps;Seated Knee Flexion: AAROM;Left;5 reps;Seated    PT Goals Acute Rehab PT Goals PT Goal: Sit to Stand - Progress: Progressing toward goal PT Goal: Stand to Sit - Progress: Progressing toward goal PT Goal: Ambulate - Progress: Progressing toward goal  Visit Information  Last PT Received On: 11/21/12 Assistance Needed: +2 PT/OT Co-Evaluation/Treatment: Yes    Subjective Data  Subjective: "I would be better if my right ankle didn't hurt." Patient Stated Goal: To go home   Cognition  Overall Cognitive Status: Appears within functional limits for tasks assessed/performed Arousal/Alertness: Awake/alert Orientation Level: Appears intact for tasks assessed Behavior During Session: [facility] for tasks performed    Balance  Static Standing Balance Static Standing - Balance Support: Bilateral upper extremity supported;During functional activity Static Standing - Level of Assistance: 4: Min assist Static Standing - Comment/# of Minutes: needed steadying assist to stand with RW  secondary to unsteady due to bil LE pain  End of Session PT - End of Session Equipment Utilized  During Treatment: Gait belt;Left knee immobilizer Activity Tolerance: Patient limited by pain Patient left: in chair;with call bell/phone within reach;with family/visitor present Nurse Communication: Mobility status CPM Left Knee CPM Left Knee: Off       [name],Emilee Market 11/21/2012, 11:50 AM  [facility] [phone] (pager)

## 2012-11-21 NOTE — Progress Notes (Signed)
Stroke Team Progress Note  HISTORY [name] is an 56 y.o. male with a history of stroke and November 2012, hypertension, hyperlipidemia and partial seizures, who experienced a transient episode of expressive aphasia and right upper extremity weakness earlier today 11/18/2012. He is status post left total knee replacement 2 days ago. He has been on Plavix for stroke prevention. Plavix was discontinued one week prior to surgery, and was resumed on 11/17/2012. Symptoms completely resolved with no recurrence. There was no associated salty taste in his mouth at the time of his symptoms this morning 11/18/2012. tPA not given   SUBJECTIVE Wife at bedside  OBJECTIVE Most recent Vital Signs: Filed Vitals:   11/20/12 2241 11/20/12 2320 11/21/12 0400 11/21/12 0800  BP:  136/71 140/64   Pulse: 68 74 69   Temp:  98.7 F (37.1 C) 97.8 F (36.6 C) 98.3 F (36.8 C)  TempSrc:  Oral Oral Oral  Resp: 14 16 16    Height:      Weight:      SpO2: 98% 98% 99%    CBG (last 3)  No results found for this basename: GLUCAP:3 in the last 72 hours  IV Fluid Intake:      . sodium chloride 125 mL/hr at 11/21/12 0404    MEDICATIONS     . sodium chloride   Intravenous Once  . aspirin EC  81 mg Oral Daily  . atorvastatin  80 mg Oral q1800  . citalopram  10 mg Oral q morning - 10a  . clopidogrel  75 mg Oral Q breakfast  . docusate sodium  100 mg Oral BID  . enoxaparin (LOVENOX) injection  40 mg Subcutaneous Q24H  . ferrous sulfate  325 mg Oral TID WC  . levETIRAcetam  250 mg Oral BID  . OxyCODONE  20 mg Oral Q12H  . senna  2 tablet Oral Daily   PRN:  acetaminophen, acetaminophen, alum & mag hydroxide-simeth, bisacodyl, diphenhydrAMINE, HYDROmorphone (DILAUDID) injection, menthol-cetylpyridinium, methocarbamol (ROBAXIN) IV, methocarbamol, metoCLOPramide (REGLAN) injection, metoCLOPramide, morphine injection, ondansetron (ZOFRAN) IV, ondansetron, oxyCODONE, phenol, zolpidem  Diet:  General    Activity:  Weight bearing as tolerated, may shower DVT Prophylaxis:  SCDs   CLINICALLY SIGNIFICANT STUDIES Basic Metabolic Panel:   Lab 11/21/12 0350 11/20/12 1349  NA 136 135  K 4.2 3.8  CL 103 101  CO2 24 23  GLUCOSE 98 162*  BUN 23 26*  CREATININE 1.55* 1.63*  CALCIUM 8.4 8.5  MG -- --  PHOS -- --   Liver Function Tests:   Lab 11/20/12 0415  AST 14  ALT 9  ALKPHOS 67  BILITOT 0.3  PROT 5.9*  ALBUMIN 2.7*   CBC:   Lab 11/21/12 0350 11/20/12 0415  WBC 8.5 9.2  NEUTROABS -- --  HGB 8.1* 8.9*  HCT 23.6* 26.0*  MCV 82.8 83.1  PLT 211 205   Coagulation: No results found for this basename: LABPROT:4,INR:4 in the last 168 hours Cardiac Enzymes: No results found for this basename: CKTOTAL:3,CKMB:3,CKMBINDEX:3,TROPONINI:3 in the last 168 hours Urinalysis: No results found for this basename: COLORURINE:2,APPERANCEUR:2,LABSPEC:2,PHURINE:2,GLUCOSEU:2,HGBUR:2,BILIRUBINUR:2,KETONESUR:2,PROTEINUR:2,UROBILINOGEN:2,NITRITE:2,LEUKOCYTESUR:2 in the last 168 hours Lipid Panel    Component Value Date/Time   CHOL 103 11/18/2012 1135   TRIG 166* 11/18/2012 1135   HDL 31* 11/18/2012 1135   CHOLHDL 3.3 11/18/2012 1135   VLDL 33 11/18/2012 1135   LDLCALC 39 11/18/2012 1135   HgbA1C  Lab Results  Component Value Date   HGBA1C 5.4 11/18/2012    Urine Drug Screen:  No results found for this basename: labopia,  cocainscrnur,  labbenz,  amphetmu,  thcu,  labbarb    Alcohol Level: No results found for this basename: ETH:2 in the last 168 hours  Cerebral Angio 11/19/2012 Occluded Lt MCA at origin  CT Angio Head 11/18/2012 Severe pre occlusive proximal M1 left MCA stenosis.  See discussion above. Formal catheter angiogram could be helpful in further quantification.  New hypodensity located at the genu of the internal capsule on the left, suspect acute 5 mm medial lenticulostriate territory infarct, not present 2012.     CT Angio Neck 11/18/2012 Nonstenotic calcific plaque at both  internal carotid artery origins.  There is good general agreement with prior catheter angiogram.   MRI of the brain  Unable to do due to surgical clips  2D Echocardiogram  Left ventricle: The cavity size was normal. There was mild concentric hypertrophy. Systolic function was normal. The estimated ejection fraction was in the range of 55% to 60%. Although no diagnostic regional wall motion abnormality was identified, this possibility cannot be completely excluded on the basis of this study.    Carotid Doppler  See angio  CXR    EKG  normal ECG since last tracing no significant change   Therapy Recommendations  pending  Physical Exam  Pleasant middle aged caucasian male not in distress.left knee swollen and has surgical dressing from knee replacement.Awake alert. Afebrile. Head is nontraumatic. Neck is supple without bruit. Hearing is normal. Cardiac exam no murmur or gallop. Lungs are clear to auscultation. Distal pulses are well felt.  Neurological Exam ; Awake  Alert oriented x 3. Normal speech and language.eye movements full without nystagmus. Face symmetric. Tongue midline. Normal strength, tone, reflexes and coordination on right side and left upper extremity and limited LLE exam due to recent knee surgery,pain and swelling.. Normal sensation. Gait deferred.   ASSESSMENT [name] is a 56 y.o. male presenting with recurrent left hemispheric TIAs-hemodynamic in setting of left MCA occlusion with dehydration, postural hypotension and anemia post TKR. MRI cannot be done due to surgical staples. Work up complete On plavix prior to admission. Now on aspirin and plavix for secondary stroke prevention. Patients neuro deficits have resolved with  no resultant deficits.     Hypertension  OSA on CPAP at home Hyperlipidemia, LDL 39, on statin PTA, at goal LDL < 100 Hx stroke/TIA 11/03/2011 Seizures Depression Renal cell carcinoma  Hospital day # 5  TREATMENT/PLAN  Continue  aspirin 81 mg and plavix 75 mg daily for secondary stroke prevention.  Orthostatic tolerance exercises.   Ok for discharge toda  Dr. Pearlean Brownie D/w wife  Annie Main, MSN, RN, ANVP-BC, ANP-BC, GNP-BC [facility] Pager: [phone] 11/21/2012 9:00 AM  I have personally obtained a history, examined the patient, evaluated imaging results, and formulated the assessment and plan of care. I agree with the above.'  Delia Heady, MD Medical Director [facility] Pager: [phone] 11/21/2012 9:00 AM

## 2012-11-21 NOTE — Progress Notes (Signed)
Patient ID: [name], male   DOB: July 11, 1956, 56 y.o.   MRN: [id number] Looks good today.  Still considerable right ankle pain.  Hgb down to 8.1, but I think that is dilutional given increased fluids.  Cr. Down some.  Will order urate level and ankle x-ray.  Anticipate discharge later today if continues to improve.

## 2012-11-21 NOTE — Progress Notes (Signed)
Home CPM settings 0-90 for 2 hours BID

## 2012-11-21 NOTE — Evaluation (Signed)
Occupational Therapy Evaluation Patient Details Name: [name] MRN: [id number] DOB: 1956/11/23 Today's Date: 11/21/2012 Time: 4098-1191 OT Time Calculation (min): 45 min  OT Assessment / Plan / Recommendation Clinical Impression  Pt is 56 y/o male admitted to [facility] for left TKA on 11/16/12.  Pt was transferred to [facility] due to slurred speech and right UE weakness. Per MD probable transient ischemic attack, likely associated with hypoperfusion involving left middle cerebral artery territory with positional change and reduced cerebral blood flow. Patient has severe stenosis of left MCA. Progression of stenosis over the past one year is unclear. Patient however is symptomatic in left MCA territory at this point, and is on Plavix and aspirin currently. Pt will benefit from skilled OT in the acute setting to maximize I with ADL and ADL mobility prior to d/c. No further DME needs at this time. Recommend HHOT for d/c    OT Assessment  Patient needs continued OT Services    Follow Up Recommendations  Home health OT;Supervision/Assistance - 24 hour    Barriers to Discharge      Equipment Recommendations  None recommended by OT    Recommendations for Other Services    Frequency  Min 2X/week    Precautions / Restrictions Precautions Precautions: Fall;Knee Required Braces or Orthoses: Knee Immobilizer - Left Knee Immobilizer - Left: Discontinue once straight leg raise with < 10 degree lag Restrictions Weight Bearing Restrictions: Yes LLE Weight Bearing: Weight bearing as tolerated   Pertinent Vitals/Pain Pt reports increased rt ankle pain with ambulation. RN aware. Xrays taken no result as time of eval     ADL  Grooming: Wash/dry hands;Minimal assistance Where Assessed - Grooming: Supported standing (forearm assist needed) Upper Body Dressing: Set up;Supervision/safety Where Assessed - Upper Body Dressing: Unsupported sitting Lower Body Dressing: Moderate assistance Where Assessed -  Lower Body Dressing: Supported sit to stand Toilet Transfer: Hydrographic surveyor Method: Sit to Barista: Bedside commode (higher surface) Toileting - Architect and Hygiene: Min guard Where Assessed - Engineer, mining and Hygiene: Standing Equipment Used: Gait belt Transfers/Ambulation Related to ADLs: Min guard A with RW ambulation. increased time required as pt with painful right ankle. X-ray taken but no results as of yet. Pt feels this is gout as he has had it before.  ADL Comments: Pt and wife educated on backward shower entry. Also, educated on LB dressing/bathing ADL adaptations and AE. Pt and wife decline AE at this time due to adequate family support.     OT Diagnosis: Generalized weakness;Acute pain  OT Problem List: Decreased activity tolerance;Decreased strength;Decreased knowledge of precautions;Decreased knowledge of use of DME or AE;Pain OT Treatment Interventions: Self-care/ADL training;DME and/or AE instruction;Therapeutic activities;Patient/family education;Balance training   OT Goals Acute Rehab OT Goals OT Goal Formulation: With patient Time For Goal Achievement: 11/28/12 Potential to Achieve Goals: Good ADL Goals Pt Will Perform Grooming: Independently ADL Goal: Grooming - Progress: Goal set today Pt Will Perform Lower Body Dressing: Sit to stand from bed;Sit to stand from chair;with min assist ADL Goal: Lower Body Dressing - Progress: Goal set today Pt Will Transfer to Toilet: with modified independence;with DME;Ambulation ADL Goal: Toilet Transfer - Progress: Goal set today Pt Will Perform Toileting - Clothing Manipulation: Independently;Standing ADL Goal: Toileting - Clothing Manipulation - Progress: Goal set today Pt Will Perform Toileting - Hygiene: Independently;Standing at 3-in-1/toilet;Sitting on 3-in-1 or toilet ADL Goal: Toileting - Hygiene - Progress: Goal set today Pt Will Perform Tub/Shower  Transfer: Shower  transfer;Ambulation;with DME ADL Goal: Tub/Shower Transfer - Progress: Goal set today  Visit Information  Last OT Received On: 11/21/12 Assistance Needed: +1    Subjective Data  Subjective: "I would be better if my right ankle didn't hurt." Patient Stated Goal: Return home   Prior Functioning     Home Living Lives With: Spouse Available Help at Discharge: Family Type of Home: House Home Access: Stairs to enter Secretary/administrator of Steps: 14 Entrance Stairs-Rails: Right Home Layout: One level Bathroom Shower/Tub: Health visitor: Standard Home Adaptive Equipment: Walker - rolling;Bedside commode/3-in-1 Prior Function Level of Independence: Independent Able to Take Stairs?: Yes Driving: Yes Vocation: Full time employment Communication Communication: No difficulties Dominant Hand: Right         Vision/Perception     Cognition  Overall Cognitive Status: Appears within functional limits for tasks assessed/performed Arousal/Alertness: Awake/alert Orientation Level: Appears intact for tasks assessed Behavior During Session: [facility] for tasks performed    Extremity/Trunk Assessment Right Upper Extremity Assessment RUE ROM/Strength/Tone: [facility] for tasks assessed Left Upper Extremity Assessment LUE ROM/Strength/Tone: WFL for tasks assessed     Mobility Bed Mobility Bed Mobility: Not assessed Transfers Sit to Stand: 3: Mod assist;With upper extremity assist;With armrests;From chair/3-in-1 Sit to Stand: Patient Percentage: 50% Stand to Sit: 3: Mod assist;With upper extremity assist;To bed Stand to Sit: Patient Percentage: 70% Details for Transfer Assistance: Allowed wife to perform assistance to patient since they want to go home today and she observed previouse treatment.  Wife to use a belt of patients around his waist to assist her with stabilizing patient.  Cues and assist needed for hand placement.  Wife able to assist and cue  effectively with transfer.       Shoulder Instructions           End of Session OT - End of Session Equipment Utilized During Treatment: Gait belt Activity Tolerance: Patient tolerated treatment well Patient left: in chair;with call bell/phone within reach;with family/visitor present Nurse Communication: Mobility status CPM Left Knee CPM Left Knee: Off  GO     Tequita Marrs 11/21/2012, 12:14 PM

## 2012-11-21 NOTE — Progress Notes (Signed)
Patient d/c'd home per MD order.  Patient and family given d/c instructions/prescriptions and all questions answered.  Patient d/c'd via wheelchair with NS/MT and family.

## 2012-11-21 NOTE — Progress Notes (Signed)
Physical Therapy Treatment Patient Details Name: [name] MRN: [id number] DOB: [birth year] Today's Date: 11/21/2012 Time: 4098-1191 PT Time Calculation (min): 25 min  PT Assessment / Plan / Recommendation Comments on Treatment Session  Spent incr time educating wife re: how to adjust walker and 3N1 at home.  Also educated wife re: full exercise program.  Verbal instruction re: up and down stairs as well as how to perform car transfer.  Pt to practice up and down steps this pm with PT.      Follow Up Recommendations  Home health PT;Supervision/Assistance - 24 hour         Barriers to Discharge  right ankle pain      Equipment Recommendations  None recommended by PT    Recommendations for Other Services  None  Frequency 7X/week   Plan Discharge plan remains appropriate;Frequency remains appropriate    Precautions / Restrictions Precautions Precautions: Fall;Knee Required Braces or Orthoses: Knee Immobilizer - Left Knee Immobilizer - Left: Discontinue once straight leg raise with < 10 degree lag Restrictions Weight Bearing Restrictions: Yes LLE Weight Bearing: Weight bearing as tolerated   Pertinent Vitals/Pain VSS, Some pain    Mobility  Bed Mobility Bed Mobility: Not assessed Transfers Transfers: Sit to Stand;Stand to Sit;Stand Pivot Transfers Sit to Stand: 3: Mod assist;With upper extremity assist;With armrests;From chair/3-in-1 Sit to Stand: Patient Percentage: 50% Stand to Sit: 3: Mod assist;With upper extremity assist;To bed Stand to Sit: Patient Percentage: 70% Stand Pivot Transfers: 4: Min assist Details for Transfer Assistance: Allowed wife to perform assistance to patient since they want to go home today and she observed previouse treatment.  Wife to use a belt of patients around his waist to assist her with stabilizing patient.  Cues and assist needed for hand placement.  Wife able to assist and cue effectively with transfer.    Ambulation/Gait Ambulation/Gait Assistance: 4: Min assist Ambulation Distance (Feet): 4 Feet Assistive device: Rolling walker Ambulation/Gait Assistance Details: Wife ambulated patient to bed and cued apppropriately.  Asssisted with movement of RW.   Gait Pattern: Step-to pattern;Decreased stride length;Trunk flexed Gait velocity: very slow General Gait Details: Pt flexes trunk with each step to change weight bearing on right ankle secondary to pain.   Stairs: No Wheelchair Mobility Wheelchair Mobility: No    Exercises Total Joint Exercises Ankle Circles/Pumps: AROM;Both;15 reps;Seated Quad Sets: AROM;Both;15 reps;Seated Towel Squeeze: AROM;Both;15 reps;Seated Short Arc Quad: AROM;Both;15 reps;Seated Heel Slides: AAROM;Both;15 reps;Seated Hip ABduction/ADduction: AAROM;Both;15 reps;Seated Straight Leg Raises: AAROM;Both;15 reps;Seated Long Arc Quad: AAROM;Both;15 reps;Seated Knee Flexion: AAROM;Both;15 reps;Seated Goniometric ROM: 3-56 degrees     PT Goals Acute Rehab PT Goals PT Goal: Sit to Stand - Progress: Progressing toward goal PT Goal: Stand to Sit - Progress: Progressing toward goal PT Goal: Ambulate - Progress: Progressing toward goal Pt will Perform Home Exercise Program: with min assist PT Goal: Perform Home Exercise Program - Progress: Goal set today  Visit Information  Last PT Received On: 11/21/12 Assistance Needed: +1 PT/OT Co-Evaluation/Treatment: Yes    Subjective Data  Subjective: "I still need to get comfortable.  Put me back in bed." Patient Stated Goal: To go home   Cognition  Overall Cognitive Status: Appears within functional limits for tasks assessed/performed Arousal/Alertness: Awake/alert Orientation Level: Appears intact for tasks assessed Behavior During Session: [facility] for tasks performed    Balance  Static Standing Balance Static Standing - Balance Support: Bilateral upper extremity supported;During functional activity Static Standing -  Level of Assistance: 4: Min assist Static  Standing - Comment/# of Minutes: needed steadying assist to stand with RW.    End of Session PT - End of Session Equipment Utilized During Treatment: Gait belt;Left knee immobilizer Activity Tolerance: Patient limited by pain Patient left: in bed;with call bell/phone within reach;with family/visitor present (on EOB) Nurse Communication: Mobility status CPM Left Knee CPM Left Knee: Off       [name],[name] 11/21/2012, 12:05 PM  Takasha Vetere [name],PT Acute Rehabilitation [phone] (pager)

## 2012-11-21 NOTE — Progress Notes (Signed)
Physical Therapy Progress Note   11/21/12 1300  PT Visit Information  Last PT Received On 11/21/12  Assistance Needed +1  PT Time Calculation  PT Start Time 1312  PT Stop Time 1329  PT Time Calculation (min) 17 min  Subjective Data  Subjective "I'm ready to leave.  I do I have to practice the stairs."  Patient Stated Goal To return home  Precautions  Precautions Fall;Knee  Required Braces or Orthoses Knee Immobilizer - Left  Knee Immobilizer - Left Discontinue once straight leg raise with < 10 degree lag  Restrictions  Weight Bearing Restrictions Yes  LLE Weight Bearing WBAT  Cognition  Overall Cognitive Status Appears within functional limits for tasks assessed/performed  Arousal/Alertness Awake/alert  Orientation Level Appears intact for tasks assessed  Behavior During Session [facility] for tasks performed  Bed Mobility  Bed Mobility Not assessed  Transfers  Transfers Sit to Stand;Stand to Sit  Sit to Stand 4: Min guard (w/c)  Stand to Sit 4: Min assist (w/c)  Details for Transfer Assistance Pt needs assistance to extend LLE during stand -> sit transfer.    Ambulation/Gait  Ambulation/Gait Assistance 4: Min guard  Ambulation Distance (Feet) 6 Feet  Assistive device Rolling walker  Ambulation/Gait Assistance Details Pt ambluated to practice single step.  Pt continues to lean forward with ambulation and needs max cues for upright posture and proper step sequence.  Gait Pattern Step-to pattern;Decreased stride length;Trunk flexed  Gait velocity very slow  General Gait Details Pt flexes trunk with each step to change weight bearing on right ankle secondary to pain.    Stairs Yes  Stair Management Technique Backwards;With walker  Number of Stairs 1   Wheelchair Mobility  Wheelchair Mobility No  PT - End of Session  Equipment Utilized During Treatment Gait belt;Left knee immobilizer  Activity Tolerance Patient limited by pain  Patient left (Pt in w/c ready to d/c home)  Nurse  Communication Mobility status  PT - Assessment/Plan  Comments on Treatment Session Pt in w/c when entering room ready to d/c home.  Educated pt on step and then pt demonstrated correct technique. Pt was left in w/c with wife present ready to go home.  No further questions from pt and caregiver.  PT Plan Discharge plan remains appropriate;Frequency remains appropriate  PT Frequency 7X/week  Follow Up Recommendations Home health PT;Supervision/Assistance - 24 hour  PT equipment None recommended by PT  Acute Rehab PT Goals  PT Goal Formulation With patient  Time For Goal Achievement 12/04/12  Potential to Achieve Goals Good  Pt will go Supine/Side to Sit with supervision  PT Goal: Supine/Side to Sit - Progress Progressing toward goal  Pt will go Sit to Supine/Side with supervision  PT Goal: Sit to Supine/Side - Progress Progressing toward goal  Pt will go Sit to Stand with supervision  PT Goal: Sit to Stand - Progress Progressing toward goal  Pt will go Stand to Sit with supervision  PT Goal: Stand to Sit - Progress Progressing toward goal  Pt will Ambulate 51 - 150 feet;with supervision;with rolling walker  PT Goal: Ambulate - Progress Met  Pt will Go Up / Down Stairs Flight;with min assist;with least restrictive assistive device  PT Goal: Up/Down Stairs - Progress Progressing toward goal  Pt will Perform Home Exercise Program with min assist  PT Goal: Perform Home Exercise Program - Progress Met  PT General Charges  $$ ACUTE PT VISIT 1 Procedure  PT Treatments  $Gait Training 8-22 mins  Pt c/o mild pain in Left knee and right ankle but does not rate.  Pt progressing toward goals and will have adequate assistance at home if needed.  Therefore pt safe to d/c home.  [place], [place] DPT [phone]

## 2012-11-22 NOTE — Care Management Note (Signed)
    Page 1 of 2   11/22/2012     2:46:38 PM   CARE MANAGEMENT NOTE 11/22/2012  Patient:  [name], [name]   Account Number:  [id number]  Date Initiated:  11/19/2012  Documentation initiated by:  Donn Pierini  Subjective/Objective Assessment:   Pt admitted for total knee now with post op CVA     Action/Plan:   PTA pt lived at home with spouse- PT/OT evals   Anticipated DC Date:  11/22/2012   Anticipated DC Plan:  HOME W HOME HEALTH SERVICES      DC Planning Services  CM consult      [facility] Choice  HOME HEALTH  DURABLE MEDICAL EQUIPMENT   Choice offered to / List presented to:  C-1 Patient   DME arranged  CPM      DME agency  [facility]     HH arranged  HH-2 PT      [facility] agency  [facility]   Status of service:  Completed, signed off Medicare Important Message given?   (If response is "NO", the following Medicare IM given date fields will be blank) Date Medicare IM given:   Date Additional Medicare IM given:    Discharge Disposition:  HOME W HOME HEALTH SERVICES  Per UR Regulation:  Reviewed for med. necessity/level of care/duration of stay  If discussed at Long Length of Stay Meetings, dates discussed:    Comments:  11/21/12- 1030- Donn Pierini RN, BSN [phone] Pt for d/c today with CPM for home and HH-PT- spoke with pt and wife at bedside- per conversation they would like to use [facility] for services and DME- pt has other needed DME at home- referral made to Lupita Leash with [facility] for [facility] services and Derrian with [facility] for DME- pt to be d/c'd home with wife. HH services to begin within 24-48 hr post discharge.

## 2012-12-17 ENCOUNTER — Ambulatory Visit: Payer: 59 | Attending: Orthopaedic Surgery | Admitting: Physical Therapy

## 2012-12-17 DIAGNOSIS — R262 Difficulty in walking, not elsewhere classified: Secondary | ICD-10-CM | POA: Insufficient documentation

## 2012-12-17 DIAGNOSIS — M25569 Pain in unspecified knee: Secondary | ICD-10-CM | POA: Insufficient documentation

## 2012-12-17 DIAGNOSIS — IMO0001 Reserved for inherently not codable concepts without codable children: Secondary | ICD-10-CM | POA: Insufficient documentation

## 2012-12-17 DIAGNOSIS — M25669 Stiffness of unspecified knee, not elsewhere classified: Secondary | ICD-10-CM | POA: Insufficient documentation

## 2012-12-18 NOTE — Procedures (Signed)
EEG NUMBER:  HISTORY:  A 57 year old male with upper extremity weakness and history of TIAs.  MEDICATIONS:  Azor, Celexa, Keppra, Crestor, Ambien, Lipitor, Plavix, Reglan, OxyContin, OxyIR, Zofran, Robaxin.  CONDITIONS OF RECORDING:  This is a 16-channel EEG carried out with the patient in the awake, drowsy, and asleep state.  DESCRIPTION:  The background activity consisted mostly of a drowse with the background activity consisting of a mixture of theta and delta rhythms that are poorly organized and diffusely distributed.  Theta activity was most prominent.  The maximum posterior background rhythm was noted to be 7 Hz theta.  No stage II sleep was noted.  It is unclear full wakefulness was able to be evaluated.  Hyperventilation was not performed.  Intermittent photic stimulation elicited symmetrical driving response, but failed to elicit any abnormalities.  IMPRESSION:  This EEG is characterized by general background slowing. Although this may be seen in normal drowse cannot rule out the possibility of a slowing related to a diffuse disturbance that is etiologically nonspecific, but may include a metabolic encephalopathy among other possibilities.  No epileptiform activity was noted.          ______________________________ Thana Farr, MD    IO:NGEX D:  12/11/2012 17:21:20  T:  12/12/2012 04:04:18  Job #:  [id number]

## 2012-12-19 ENCOUNTER — Ambulatory Visit: Payer: 59 | Admitting: Physical Therapy

## 2012-12-20 ENCOUNTER — Ambulatory Visit: Payer: 59 | Admitting: Physical Therapy

## 2012-12-21 ENCOUNTER — Ambulatory Visit: Payer: 59 | Admitting: Physical Therapy

## 2012-12-24 ENCOUNTER — Ambulatory Visit: Payer: 59 | Admitting: Physical Therapy

## 2012-12-26 ENCOUNTER — Ambulatory Visit: Payer: 59 | Admitting: Physical Therapy

## 2012-12-28 ENCOUNTER — Ambulatory Visit: Payer: 59 | Admitting: Physical Therapy

## 2012-12-31 ENCOUNTER — Ambulatory Visit: Payer: 59 | Admitting: Physical Therapy

## 2013-01-02 ENCOUNTER — Ambulatory Visit: Payer: 59 | Admitting: Physical Therapy

## 2013-01-03 ENCOUNTER — Ambulatory Visit: Payer: 59 | Admitting: Physical Therapy

## 2013-01-04 ENCOUNTER — Encounter: Payer: 59 | Admitting: Physical Therapy

## 2013-01-07 ENCOUNTER — Ambulatory Visit: Payer: 59 | Attending: Orthopaedic Surgery | Admitting: Physical Therapy

## 2013-01-07 DIAGNOSIS — M25569 Pain in unspecified knee: Secondary | ICD-10-CM | POA: Insufficient documentation

## 2013-01-07 DIAGNOSIS — IMO0001 Reserved for inherently not codable concepts without codable children: Secondary | ICD-10-CM | POA: Insufficient documentation

## 2013-01-07 DIAGNOSIS — R262 Difficulty in walking, not elsewhere classified: Secondary | ICD-10-CM | POA: Insufficient documentation

## 2013-01-07 DIAGNOSIS — M25669 Stiffness of unspecified knee, not elsewhere classified: Secondary | ICD-10-CM | POA: Insufficient documentation

## 2013-01-09 ENCOUNTER — Ambulatory Visit: Payer: 59 | Admitting: Physical Therapy

## 2013-01-10 ENCOUNTER — Ambulatory Visit: Payer: 59 | Admitting: Physical Therapy

## 2013-01-14 ENCOUNTER — Ambulatory Visit: Payer: 59 | Admitting: Physical Therapy

## 2013-01-16 ENCOUNTER — Ambulatory Visit: Payer: 59 | Admitting: Physical Therapy

## 2013-01-18 ENCOUNTER — Ambulatory Visit: Payer: 59 | Admitting: Physical Therapy

## 2013-01-21 ENCOUNTER — Ambulatory Visit: Payer: 59 | Admitting: Physical Therapy

## 2013-01-23 ENCOUNTER — Ambulatory Visit: Payer: 59 | Admitting: Physical Therapy

## 2013-01-25 ENCOUNTER — Ambulatory Visit: Payer: 59 | Admitting: Physical Therapy

## 2013-01-28 ENCOUNTER — Ambulatory Visit: Payer: 59 | Admitting: Physical Therapy

## 2013-01-30 ENCOUNTER — Ambulatory Visit: Payer: 59 | Admitting: Physical Therapy

## 2013-01-31 ENCOUNTER — Ambulatory Visit: Payer: 59 | Admitting: Physical Therapy

## 2013-02-04 ENCOUNTER — Ambulatory Visit: Payer: 59 | Attending: Orthopaedic Surgery | Admitting: Physical Therapy

## 2013-02-04 DIAGNOSIS — R262 Difficulty in walking, not elsewhere classified: Secondary | ICD-10-CM | POA: Insufficient documentation

## 2013-02-04 DIAGNOSIS — IMO0001 Reserved for inherently not codable concepts without codable children: Secondary | ICD-10-CM | POA: Insufficient documentation

## 2013-02-04 DIAGNOSIS — M25669 Stiffness of unspecified knee, not elsewhere classified: Secondary | ICD-10-CM | POA: Insufficient documentation

## 2013-02-04 DIAGNOSIS — M25569 Pain in unspecified knee: Secondary | ICD-10-CM | POA: Insufficient documentation

## 2013-02-07 ENCOUNTER — Ambulatory Visit: Payer: 59 | Admitting: Physical Therapy

## 2013-02-11 ENCOUNTER — Ambulatory Visit: Payer: 59 | Admitting: Physical Therapy

## 2013-02-14 ENCOUNTER — Ambulatory Visit: Payer: 59 | Admitting: Physical Therapy

## 2013-02-18 ENCOUNTER — Ambulatory Visit: Payer: 59 | Admitting: Physical Therapy

## 2013-02-21 ENCOUNTER — Ambulatory Visit: Payer: 59 | Admitting: Physical Therapy

## 2013-02-25 ENCOUNTER — Ambulatory Visit: Payer: 59 | Admitting: Physical Therapy

## 2013-02-26 ENCOUNTER — Ambulatory Visit: Payer: 59 | Admitting: Physical Therapy

## 2013-02-28 ENCOUNTER — Ambulatory Visit: Payer: 59 | Admitting: Physical Therapy

## 2013-03-01 ENCOUNTER — Ambulatory Visit: Payer: 59 | Admitting: Physical Therapy

## 2013-04-04 DEATH — deceased

## 2013-05-05 DEATH — deceased

## 2013-05-28 ENCOUNTER — Other Ambulatory Visit (HOSPITAL_COMMUNITY): Payer: Self-pay | Admitting: Cardiology

## 2013-05-28 DIAGNOSIS — I2510 Atherosclerotic heart disease of native coronary artery without angina pectoris: Secondary | ICD-10-CM

## 2013-06-01 ENCOUNTER — Other Ambulatory Visit: Payer: Self-pay | Admitting: Neurology

## 2013-06-03 ENCOUNTER — Encounter: Payer: Self-pay | Admitting: Neurology

## 2013-06-03 DIAGNOSIS — I6789 Other cerebrovascular disease: Secondary | ICD-10-CM

## 2013-06-06 ENCOUNTER — Ambulatory Visit (INDEPENDENT_AMBULATORY_CARE_PROVIDER_SITE_OTHER): Payer: 59 | Admitting: Neurology

## 2013-06-06 ENCOUNTER — Encounter: Payer: Self-pay | Admitting: Neurology

## 2013-06-06 VITALS — BP 119/72 | HR 56 | Temp 97.8°F | Ht 70.0 in | Wt 225.0 lb

## 2013-06-06 DIAGNOSIS — G459 Transient cerebral ischemic attack, unspecified: Secondary | ICD-10-CM

## 2013-06-06 NOTE — Patient Instructions (Signed)
Reduce keppra to 250 mg hs x 1 wek and stop. Check EEG and TCD studies. F/U in 6 months

## 2013-06-09 NOTE — Progress Notes (Signed)
[facility] [address] [place]. [place] [address] [phone]       OFFICE FOLLOW-UP NOTE  [name] Date of Birth:  1956-05-02 Medical Record Number:  [id number]   HPI:57 year male with left hemispheric TIA in Dec 2013 s/p knee replacement surgery & postoperative hypotension in setting of chronic left MCA occlusion.Vascular risk factors of Hypertension, Hyperlipidimia, sleep apnoea and mild obesity. He returns for f/u after last visit 01/29/2013.He is doing well and has not had any TIAs or seizures and has no complaints.He has stopped asprin and is tolerating plavix alone well without bleeding or bruising. He states his BP is well controlled and he uses CPAP every night.  ROS:   14 system review of systems is positive for no complaints PMH:  Past Medical History  Diagnosis Date  . Renal cell carcinoma   . Hypertension   . Sleep apnea     CPAP  . High cholesterol   . Arthritis   . Shortness of breath     uses cpap  . Stroke 11/03/2011    TIA  . Depression   . Seizures     Social History:  History   Social History  . Marital Status: Married    Spouse Name: Nolon Lennert    Number of Children: 2  . Years of Education: College   Occupational History  . [employer]     Self Employed   Social History Main Topics  . Smoking status: Former Smoker -- 2 years    Types: Cigarettes    Quit date: 12/05/1988  . Smokeless tobacco: Never Used     Comment: QUIT SMOKING OVER 35 YEAR'S AGO  ( 1970 'S)  . Alcohol Use: 0.0 oz/week     Comment: socially  . Drug Use: No  . Sexually Active: Yes   Other Topics Concern  . 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   social drinker, never a smoker.    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     Medications:   Current Outpatient  Prescriptions on File Prior to Visit  Medication Sig Dispense Refill  . amLODipine-olmesartan (AZOR) 5-40 MG per tablet Take 1 tablet by mouth every morning.       . citalopram (CELEXA) 20 MG tablet TAKE 1 TABLET EVERY DAY  90 tablet  1  . clopidogrel (PLAVIX) 75 MG tablet Take 75 mg by mouth daily.      . Colesevelam HCl 3.75 G PACK Take 3.75 g by mouth at bedtime.       . fish oil-omega-3 fatty acids 1000 MG capsule Take 1 g by mouth daily.        Marland Kitchen levETIRAcetam (KEPPRA) 250 MG tablet Take 250 mg by mouth 2 (two) times daily.      . rosuvastatin (CRESTOR) 40 MG tablet Take 40 mg by mouth every morning.      . zolpidem (AMBIEN) 5 MG tablet Take 5 mg by mouth at bedtime as needed.       No current facility-administered medications on file prior to visit.    Allergies:  No Known Allergies  Physical Exam General: well developed, well nourished, seated, in no evident distress Head: head normocephalic and atraumatic. Orohparynx benign Neck: supple with no carotid or supraclavicular bruits Cardiovascular: regular rate and  rhythm, no murmurs Musculoskeletal: no deformity Skin:  no rash/petichiae Vascular:  Normal pulses all extremities  Neurologic Exam Mental Status: Awake and fully alert. Oriented to place and time. Recent and remote memory intact. Attention span, concentration and fund of knowledge appropriate. Mood and affect appropriate.  Cranial Nerves: Fundoscopic exam reveals sharp disc margins. Pupils equal, briskly reactive to light. Extraocular movements full without nystagmus. Visual fields full to confrontation. Hearing intact. Facial sensation intact. Face, tongue, palate moves normally and symmetrically.  Motor: Normal bulk and tone. Normal strength in all tested extremity muscles. Sensory.: intact to tough and pinprick and vibratory.  Coordination: Rapid alternating movements normal in all extremities. Finger-to-nose and heel-to-shin performed accurately bilaterally. Gait and  Station: Arises from chair without difficulty. Stance is normal. Gait demonstrates normal stride length and balance . Able to heel, toe and tandem walk without difficulty.  Reflexes: 1+ and symmetric. Toes downgoing.     ASSESSMENT: 57 year male with left hemispheric TIA in Dec 2013 s/p knee replacement surgery & postoperative hypotension in setting of chronic left MCA occlusion.Vascular risk factors of Hypertension, Hyperlipidimia, sleep apnoea and mild obesity.    PLAN: Continue clopidogrel 75 mg orally every day  for secondary stroke prevention and maintain strict control of hypertension with blood pressure goal below 130/90, and lipids with LDL cholesterol goal below 100 mg/dL.Stop keppra as there is no clear documentation of seizures.Check EEG to look for left hemispheric irritability .Check follow up  TCD.Followup in the future with Heide Guile, NP in 6 months.

## 2013-06-11 ENCOUNTER — Encounter: Payer: Self-pay | Admitting: Dietician

## 2013-06-11 ENCOUNTER — Encounter: Payer: 59 | Attending: Family Medicine | Admitting: Dietician

## 2013-06-11 VITALS — Wt 226.7 lb

## 2013-06-11 DIAGNOSIS — E785 Hyperlipidemia, unspecified: Secondary | ICD-10-CM | POA: Insufficient documentation

## 2013-06-11 DIAGNOSIS — Z713 Dietary counseling and surveillance: Secondary | ICD-10-CM | POA: Insufficient documentation

## 2013-06-11 NOTE — Progress Notes (Signed)
Medical Nutrition Therapy:  Appt start time: 0800 end time:  0900.  Assessment:  Primary concerns today: obesity. It is important to note that although BMI>30, WC is only 36 in.   MEDICATIONS: see list   DIETARY INTAKE:  Usual eating pattern includes 3 meals and 1-2 snacks per day.  Everyday foods include cereal in morning, coffee.  Avoided foods include sodas.    24-hr recall:  B ( AM): 0700-0800 eaten. basic four cereal with 2% milk or blueberry muffin prior to meds. 1-2 cups coffee (sweetened creamer 1 oz.) Snk ( AM): none  L ( PM): 1130-1300 eaten. Club sandwich ([place], bacon, mayo, cheese) and water or gatorade (rarely) or diet sweet tea. If not, grilled chix sandwich from drive thru with mayo, cheese, lettuce, tomato. Snk ( PM): chips and salsa, used to eat nabs but has quit that, chix salad made with mayo, fruit, nuts and crackers D ( PM): chix, shrimp, or salmon. Red meat rarely now. Occasional caesar or baby spinach salad with poppy seed dressing. If he goes out to eat, will be mostly fish or clam chowder or chix soup. Occasional fried options for shrimp or fish. Usually has broccoli as a side when eating out. At home, side options include corn on cob without butter or salt, baked beans, baked potato. Sometimes cole slaw or green bean casserole or squash casserole (this one made with light cream cheese). When makes fish, will usually marinate in teriyaki, and will usually marinate chix in salad dressing. Veg on grill has olive oil, salt, pepper. Snk ( PM): once per week light ice cream, every other day 3 -4 dark chocolates Beverages: water, gatorade, diet tea. Social drinker EtOH (says usually gin and tonic) Makes an effort to eat every 4-5 hours. He has also made concerted efforts towards lifestyle improvements in the past few years since his health problems emerged. He has a number of questions about diet modifications and clearly wishes to continue these efforts, but wants to gain  more understanding of merits of various food choices and add more variety.   Usual physical activity: works as a Visual merchandiser. Goes to garden for many hours most days. 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 He usually takes about 4-5 hours for work, then a break, then more work for a few hours outside.  Progress Towards Goal(s):  In progress.   Nutritional Diagnosis:  NB-1.1 Food and nutrition-related knowledge deficit As related to efficacy of changes adopted to diet.  As evidenced by pt questions, some misconceptions about health effects of certain foods.    Intervention:  Nutrition education provided regarding best style of diet to adopt for general health, weight control, limiting sodium intake. Emphasis was placed on regular incorporation of lean proteins and high fiber foods, with limits on very sugary foods and land animal fats (creams, butter, skin of poultry, fatty red meats). RD also answered numerous questions about need to eat salads versus cooked vegetables, value of eggs and red meats, value of nuts, etc. Because BP has been well-controlled recently with meds and pt has already made good changes to sodium restriction, elevated TGs are most pressing nutrition concern in pt's mind. In this regard, pt recommended increased frequency of fish consumption, as well as 1000 mg combined daily of EPA and DHA. RD demonstrated how to read his fish oil labels to identify the proper dosage.   Handouts given during visit include:  High Fat, Sugar, Protein, Fiber foods  Foods to Choose More  and Foods to Limit to improve heart health  Monitoring/Evaluation:  Dietary intake, exercise, portion control, and body weight in 6 week(s).

## 2013-06-19 ENCOUNTER — Encounter (HOSPITAL_COMMUNITY)
Admission: RE | Admit: 2013-06-19 | Discharge: 2013-06-19 | Disposition: A | Discharge status: Home / Self Care | Payer: 59 | Source: Ambulatory Visit | Attending: Cardiology | Admitting: Cardiology

## 2013-06-19 ENCOUNTER — Other Ambulatory Visit: Payer: Self-pay

## 2013-06-19 DIAGNOSIS — Z8249 Family history of ischemic heart disease and other diseases of the circulatory system: Secondary | ICD-10-CM | POA: Insufficient documentation

## 2013-06-19 DIAGNOSIS — I10 Essential (primary) hypertension: Secondary | ICD-10-CM | POA: Insufficient documentation

## 2013-06-19 DIAGNOSIS — I2510 Atherosclerotic heart disease of native coronary artery without angina pectoris: Secondary | ICD-10-CM | POA: Insufficient documentation

## 2013-06-19 DIAGNOSIS — R9439 Abnormal result of other cardiovascular function study: Secondary | ICD-10-CM | POA: Insufficient documentation

## 2013-06-19 MED ORDER — TECHNETIUM TC 99M SESTAMIBI GENERIC - CARDIOLITE
10.0000 | Freq: Once | INTRAVENOUS | Status: AC | PRN
  Administered 2013-06-19: 10 via INTRAVENOUS

## 2013-06-19 MED ORDER — TECHNETIUM TC 99M SESTAMIBI GENERIC - CARDIOLITE
30.0000 | Freq: Once | INTRAVENOUS | Status: AC | PRN
  Administered 2013-06-19: 30 via INTRAVENOUS

## 2013-07-16 ENCOUNTER — Other Ambulatory Visit: Payer: Self-pay | Admitting: Neurology

## 2013-07-26 ENCOUNTER — Encounter: Payer: Self-pay | Admitting: Dietician

## 2013-07-26 ENCOUNTER — Encounter: Payer: 59 | Attending: Family Medicine | Admitting: Dietician

## 2013-07-26 VITALS — Wt 234.2 lb

## 2013-07-26 DIAGNOSIS — Z713 Dietary counseling and surveillance: Secondary | ICD-10-CM | POA: Insufficient documentation

## 2013-07-26 DIAGNOSIS — E785 Hyperlipidemia, unspecified: Secondary | ICD-10-CM | POA: Insufficient documentation

## 2013-07-26 NOTE — Progress Notes (Signed)
A: Pt reports with significant weight gain of nearly 8 lbs. (234.2 lbs). Pt admits to making poorer choices and eating more than usual since last appointment. He has been eating a good bit of chocolate milk, as well as more burgers, hot dogs, french fries. More fast food, more restaurant food on his beach vacation.  Pt also states that he has not been as active as previous due to the heat.   Pt claims he would like a more specific and regimented allotment of foods at each meal to stick to, as he understands what foods are lower in calories, fat, and sugar, but is struggling with portion size.  I: RD prescribed a 2200 kcal per day diet at approximately 35% protein, 20% fat, and 45% carb. The pt will eat 3 meals and one snack. Each meal will contain 60 grams carbohydrate, with 4-6 oz of protein (28-42 grams), and 10-20 grams of fat. His snack will be in the afternoon and incorporate 30 grams of carb and 10 grams of fat.  RD explained what a sample menu may look like to achieve this goal at each meal and all snacks.  M/E: Monitor portion control, diet adherence, body weight in 2 months.

## 2013-09-30 ENCOUNTER — Ambulatory Visit: Payer: 59 | Admitting: Dietician

## 2013-12-10 ENCOUNTER — Ambulatory Visit: Payer: 59 | Admitting: Nurse Practitioner

## 2013-12-18 ENCOUNTER — Ambulatory Visit: Payer: 59 | Admitting: Cardiology

## 2013-12-30 ENCOUNTER — Encounter: Payer: Self-pay | Admitting: Cardiology

## 2014-01-07 ENCOUNTER — Encounter: Payer: Self-pay | Admitting: Cardiology

## 2014-01-07 ENCOUNTER — Ambulatory Visit (INDEPENDENT_AMBULATORY_CARE_PROVIDER_SITE_OTHER): Payer: 59 | Admitting: Cardiology

## 2014-01-07 VITALS — BP 157/89 | HR 77 | Ht 70.0 in | Wt 235.1 lb

## 2014-01-07 DIAGNOSIS — I6789 Other cerebrovascular disease: Secondary | ICD-10-CM

## 2014-01-07 DIAGNOSIS — I2510 Atherosclerotic heart disease of native coronary artery without angina pectoris: Secondary | ICD-10-CM

## 2014-01-07 DIAGNOSIS — R9439 Abnormal result of other cardiovascular function study: Secondary | ICD-10-CM | POA: Insufficient documentation

## 2014-01-07 DIAGNOSIS — I2584 Coronary atherosclerosis due to calcified coronary lesion: Secondary | ICD-10-CM

## 2014-01-07 DIAGNOSIS — G459 Transient cerebral ischemic attack, unspecified: Secondary | ICD-10-CM

## 2014-01-07 DIAGNOSIS — E669 Obesity, unspecified: Secondary | ICD-10-CM

## 2014-01-07 DIAGNOSIS — D62 Acute posthemorrhagic anemia: Secondary | ICD-10-CM

## 2014-01-07 NOTE — Progress Notes (Signed)
[facility]. [address], [address] [place], [place]  [address] Phone: [phone] Fax:  [phone]  Date:  01/07/2014   ID:  SECUNDINO ELLITHORPE, DOB 02-May-1956, MRN [id number]  PCP:  Lynne Logan, MD   History of Present Illness: [name] is a 58 y.o. male Rodena Piety Hemphill's brother) who was referred after CT scan demonstrated coronary artery calcification (multivessel - LAD, Circ, RCA) as well as aortic arch calcification. CT scan was personally viewed. He underwent right radical nephrectomy and pericaval lymph node dissection on 12/22/10. He also had a hypotensive episode following knee replacement which resulted in TIA. 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 Since he was having sensation of seizures, prodrome of possible salty taste in his mouth, Keppra was being utilized by Dr. [name]. The CT scan also revealed small bilateral lung nodules that were most likely inflammatory in nature. He was recommended to repeat. He has no prior history of cardiovascular disease. He has had a radical nephrectomy, anterior cruciate ligament surgery as well as biceps tendon surgery. He has a history of GERD, arthritis and a history of TIA recorded. He also has a history of hyperlipidemia for which he is taking Crestor. 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 He is also on good antihypertensive medications. He is a Engineer, structural, nonsmoker. His creatinine is 1.1. EKG, personally viewed from November 13, 2012 shows sinus bradycardia rate 56 with no other abnormalities.  06/25/13-he underwent nuclear stress test which did not show any evidence of perfusion abnormality, TID. His exercise treadmill portion of the test however did demonstrate ST segment depression diffusely. Today we had a lengthy discussion with he and his wife about the possible implications of this including a discussion of  balanced ischemia. We also had a discussion on continued prevention strategies with hypertension, hyperlipidemia and antiplatelet agent. After this discussion, he decided to continue with prevention strategy and not proceed with angiography. I did clearly state that if symptoms were to arise, that I would have a low threshold for angiography. They both understand the possibility still exists for triple-vessel disease  01/07/14-was splitting wood prior to visit today, no symptoms. He has not been experiencing any anginal symptoms. Has some large family life events coming up in the next few months. We once again had a lengthy discussion about his prior stress test, coronary calcifications, family history. With his ST changes on treadmill test, coronary calcifications, despite no perfusion defect, I strongly encouraged him to pursue coronary angiography. At this time, he did not wish to move forward with this. I explained to him carefully that he may very well have triple-vessel coronary artery disease. Bypass surgery may be required. He understands. He will think about at home.   Wt Readings from Last 3 Encounters:  01/07/14 235 lb 1.9 oz (106.65 kg)  07/26/13 234 lb 3.2 oz (106.232 kg)  06/11/13 226 lb 11.2 oz (102.83 kg)     Past Medical History  Diagnosis Date  . Renal cell carcinoma   . Hypertension   . Sleep apnea     CPAP  . High cholesterol   . Arthritis   . Shortness of breath     uses cpap  . Stroke 11/03/2011    TIA  . Depression   . Seizures     Past Surgical History  Procedure Laterality Date  . Nephrectomy    . Carpal  tunnel release    . Knee arthroscopy    . Anterior cruciate ligament repair    . Biceps tendon repair    . Total knee arthroplasty  11/16/2012    Procedure: TOTAL KNEE ARTHROPLASTY;  Surgeon: Mcarthur Rossetti, MD;  Location: [facility];  Service: Orthopedics;  Laterality: Left;  Left Total Knee Arthroplasty    Current Outpatient Prescriptions    Medication Sig Dispense Refill  . amLODipine-olmesartan (AZOR) 5-40 MG per tablet Take 1 tablet by mouth every morning.       . citalopram (CELEXA) 20 MG tablet TAKE 1 TABLET EVERY DAY  90 tablet  1  . clopidogrel (PLAVIX) 75 MG tablet TAKE 1 TABLET EVERY DAY  90 tablet  1  . levETIRAcetam (KEPPRA) 250 MG tablet Take 250 mg by mouth 2 (two) times daily.      . rosuvastatin (CRESTOR) 40 MG tablet Take 40 mg by mouth every morning.      . zolpidem (AMBIEN) 5 MG tablet Take 5 mg by mouth at bedtime as needed.       No current facility-administered medications for this visit.    Allergies:   No Known Allergies  Social History:  The patient  reports that he quit smoking about 25 years ago. His smoking use included Cigarettes. He smoked 0.00 packs per day for 2 years. He has never used smokeless tobacco. He reports that he drinks alcohol. He reports that he does not use illicit drugs.   ROS:  Please see the history of present illness.   Positive gout right first toe. No chest pain, no shortness of breath. Positive obesity. No bleeding.    PHYSICAL EXAM: VS:  BP 157/89  Pulse 77  Ht 5\' 10"  (1.778 m)  Wt 235 lb 1.9 oz (106.65 kg)  BMI 33.74 kg/m2 Well nourished, well developed, in no acute distress HEENT: normal Neck: no JVD Cardiac:  normal S1, S2; RRR; no murmur Lungs:  clear to auscultation bilaterally, no wheezing, rhonchi or rales Abd: soft, nontender, no hepatomegaly Ext: no edema Skin: warm and dry Neuro: no focal abnormalities noted  EKG:  01/07/14 Sinus rhythm, no other changes. Heart rate 77.     ASSESSMENT AND PLAN:  1. Coronary artery calcification with ST segment depression on ETT. Today strongly encouraged pursuit of coronary angiography to exclude triple-vessel disease. Understands risk. He does not wish to move forward at this time. He may call me at any time to set this up. 2. Hyperlipidemia-his sister, Rodena Piety, has familial hyperlipidemia. He is currently on high-dose  Crestor. LDL goal would be less than 70 for him. Dr. Nolon Rod currently monitoring. 3. Obesity-encourage weight loss. 4. Single kidney/chronic kidney disease stage III-creatinine 1.5 in 2013. Avoiding NSAIDs. Taking prednisone for gouty flareups. We discussed this is a risk for contrast-induced nephropathy. 5. We will see back in 6 months.  Signed, Candee Furbish, MD [facility]  01/07/2014 3:00 PM

## 2014-01-07 NOTE — Patient Instructions (Signed)
Your physician recommends that you continue on your current medications as directed. Please refer to the Current Medication list given to you today.  Your physician wants you to follow-up in: 6 months with Dr. Skains. You will receive a reminder letter in the mail two months in advance. If you don't receive a letter, please call our office to schedule the follow-up appointment.  

## 2014-03-13 ENCOUNTER — Telehealth: Payer: Self-pay | Admitting: Cardiology

## 2014-03-13 NOTE — Telephone Encounter (Signed)
[name] called re statement rec'd for [facility] 01/07/2014.  [name] thought he had paid his copay.  I check the account and did not find any payment made that day.  Asked her to find receipt and to check to see if a debit card transaction had been made that day.  She agreed and would call back if they found that a copay had indeed been made.

## 2014-04-22 ENCOUNTER — Other Ambulatory Visit (HOSPITAL_COMMUNITY): Payer: Self-pay | Admitting: Orthopaedic Surgery

## 2014-04-22 DIAGNOSIS — S62009A Unspecified fracture of navicular [scaphoid] bone of unspecified wrist, initial encounter for closed fracture: Secondary | ICD-10-CM

## 2014-04-24 ENCOUNTER — Ambulatory Visit (HOSPITAL_COMMUNITY)
Admission: RE | Admit: 2014-04-24 | Discharge: 2014-04-24 | Disposition: A | Discharge status: Home / Self Care | Payer: 59 | Source: Ambulatory Visit | Attending: Orthopaedic Surgery | Admitting: Orthopaedic Surgery

## 2014-04-24 DIAGNOSIS — X58XXXA Exposure to other specified factors, initial encounter: Secondary | ICD-10-CM | POA: Insufficient documentation

## 2014-04-24 DIAGNOSIS — S62009A Unspecified fracture of navicular [scaphoid] bone of unspecified wrist, initial encounter for closed fracture: Secondary | ICD-10-CM | POA: Insufficient documentation

## 2014-07-15 IMAGING — CT CT ANGIO NECK
1 of 9 series · 1 of 33 positions shown · IV contrast (OMNIPAQUE)
Comparison: Formal catheter angiogram 11/03/2011.

CTA NECK

CLINICAL DATA: Suspected subcortical TIA involving the left MCA
territory.  Normal neurologic exam.  Prior history of stroke.
Plavix recently discontinued due to knee surgery.

CT ANGIOGRAPHY HEAD AND NECK
TECHNIQUE: Multidetector CT imaging of the head and neck was
performed using the standard protocol during bolus administration
of intravenous contrast.  Multiplanar CT image reconstructions
including MIPs were obtained to evaluate the vascular anatomy.
Carotid stenosis measurements (when applicable) are obtained
utilizing NASCET criteria, using the distal internal carotid
diameter as the denominator.
Contrast:  100 ml Omnipaque 350.

[Series 3: headseq 4.8 h45s · axial · 0.43mm/px · 1 of 30 slices shown]
[im 1/30  soft-tissue]
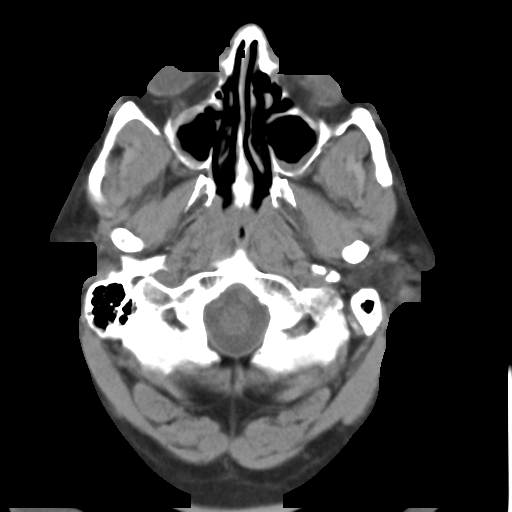

[1 of 33 positions shown; findings below may reference images not displayed]

FINDINGS: There is no significant atheromatous change of the
transverse arch or great vessel origins.  Conventional branching
pattern.  No pneumothorax or lung apex mass.  No thyroid lesions.
No significant adenopathy.  Moderate cervical spondylosis.

There is a shallow calcific plaque at the left internal carotid
artery origin. There is no measurable stenosis.  There is no soft
plaque or ulceration.  There is no dissection or fibromuscular
dysplasia.

There is nonstenotic calcific plaque at the right internal carotid
artery origin without ulceration or soft plaque. There is no
measurable stenosis.  There is no dissection or fibromuscular
dysplasia.

Both vertebral arteries are patent with the without significant
ostial stenosis.  There equal in size.

 Review of the MIP images confirms the above findings.
IMPRESSION: Nonstenotic calcific plaque at both internal carotid artery
origins.  There is good general agreement with prior catheter
angiogram.

CTA HEAD
FINDINGS: There is no evidence for intracranial hemorrhage, mass
lesion, hydrocephalus, or extra-axial fluid.  There is no atrophy.
Mild chronic microvascular ischemic changes are suggested in the
periventricular and subcortical white matter.

There is a new 5 mm hypodensity involving the left internal capsule
genu (image 14 series 16) not present previously.  I suspect this
is an acute medial lenticulostriate infarct.  Post infusion, no
abnormal intracranial enhancement.  Calvarium intact.  Moderate
sinus disease.  No mastoid fluid.

Right internal carotid artery demonstrates a 50% calcific stenosis
at the cavernous/supraclinoid segment.  This is similar to prior
catheter angiogram.  Right A1 segment hypoplastic.  Right middle
cerebral artery normal.

Normal left internal carotid artery through the skull base and
supraclinoid segments but except for slight nonstenotic lateral
cavernous calcification.  Both anterior cerebral arteries fill from
the left.  There is a severe pre-occlusive narrowing of the left
middle cerebral artery M1 segment (images 99 - 100 series 605).  It
is unclear to me if the left MCA fills antegrade or there are
collaterals which bypass this severe stenosis.  The caliber of the
left M2 and M3 MCA vessels appear normal.  There is no MCA branch
occlusion observed.

The basilar artery is widely patent with the vertebrals codominant.
No cerebellar branch occlusion is seen.

Compared with angiography, a similar appearance is noted.

 Review of the MIP images confirms the above findings.
IMPRESSION: Severe pre occlusive proximal M1 left MCA stenosis.  See discussion
above. Formal catheter angiogram could be helpful in further
quantification.

New hypodensity located at the genu of the internal capsule on the
left, suspect acute 5 mm medial lenticulostriate territory infarct,
not present 8318.

## 2014-07-18 IMAGING — CR DG ANKLE PORT 2V*R*
2 series · 2 of 2 positions shown · non-contrast
Comparison: None.

CLINICAL DATA: Ankle pain and swelling

PORTABLE RIGHT ANKLE - 2 VIEW

[AP]
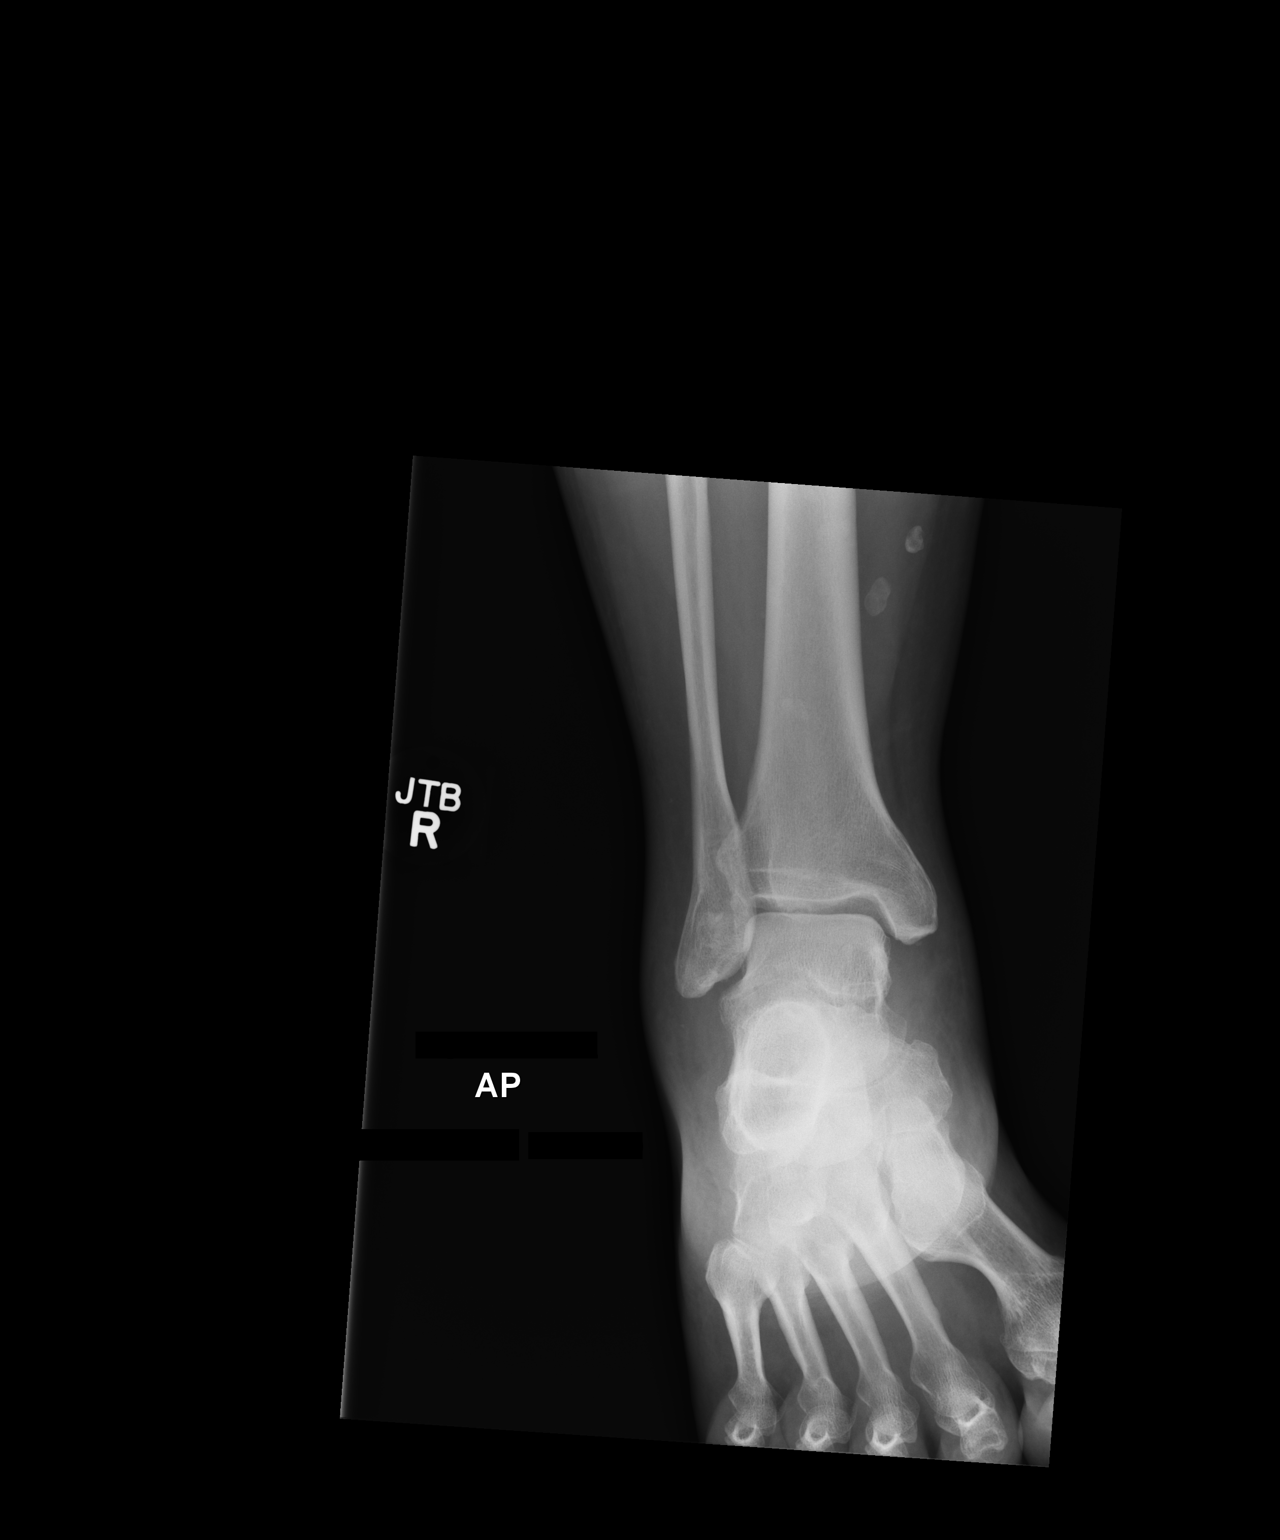

[lateral]
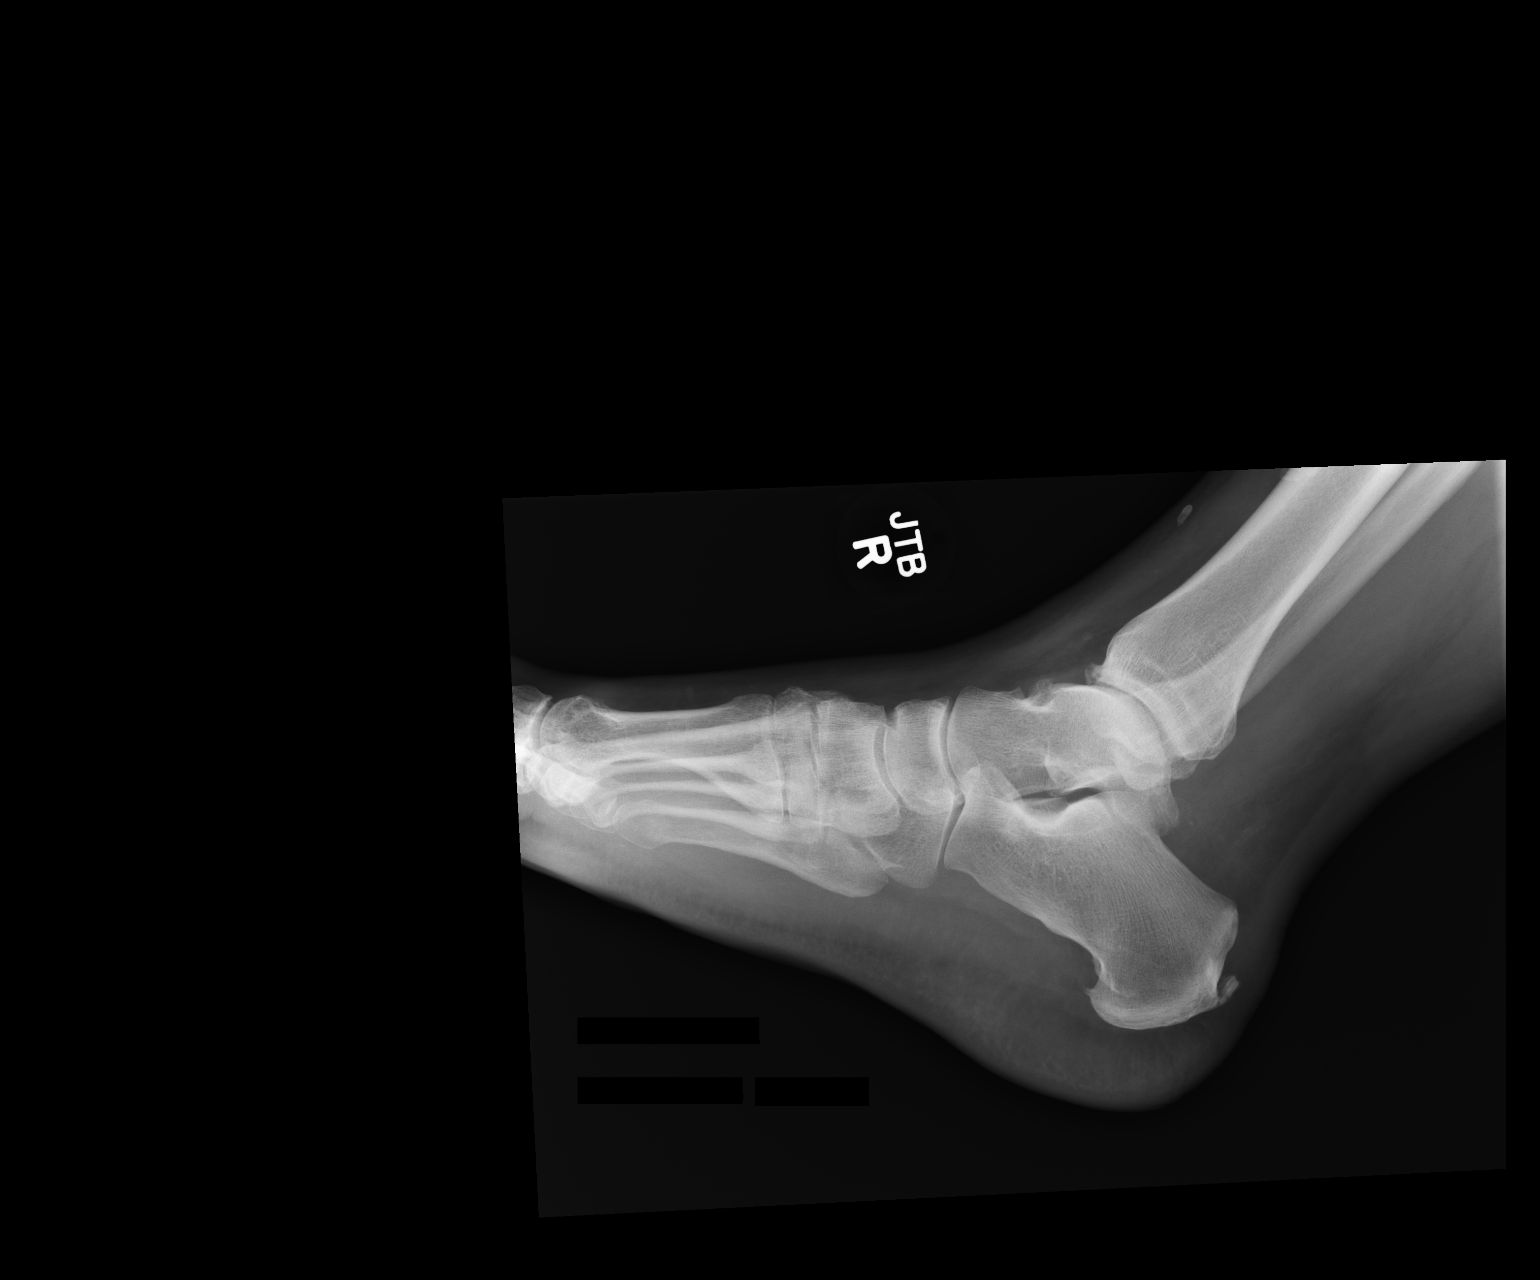

[2 of 2 positions shown; findings below may reference images not displayed]

FINDINGS: Two-view portable exam at 4090 hours shows no fracture.
No evidence for gross dislocation.  Ankle mortise is preserved.
There are degenerative changes in the tibia talar joint.
IMPRESSION: No acute bony findings.

Degenerative spurring in the tibiotalar joint.

## 2015-05-20 ENCOUNTER — Other Ambulatory Visit (HOSPITAL_COMMUNITY): Payer: Self-pay | Admitting: Orthopaedic Surgery

## 2015-05-20 ENCOUNTER — Ambulatory Visit (HOSPITAL_COMMUNITY)
Admission: RE | Admit: 2015-05-20 | Discharge: 2015-05-20 | Disposition: A | Discharge status: Home / Self Care | Payer: 59 | Source: Ambulatory Visit | Attending: Orthopaedic Surgery | Admitting: Orthopaedic Surgery

## 2015-05-20 DIAGNOSIS — M25461 Effusion, right knee: Secondary | ICD-10-CM | POA: Insufficient documentation

## 2015-05-20 DIAGNOSIS — R937 Abnormal findings on diagnostic imaging of other parts of musculoskeletal system: Secondary | ICD-10-CM | POA: Diagnosis not present

## 2015-05-20 DIAGNOSIS — M179 Osteoarthritis of knee, unspecified: Secondary | ICD-10-CM | POA: Insufficient documentation

## 2015-05-20 DIAGNOSIS — M25561 Pain in right knee: Secondary | ICD-10-CM

## 2015-05-20 DIAGNOSIS — M7121 Synovial cyst of popliteal space [Baker], right knee: Secondary | ICD-10-CM | POA: Diagnosis not present

## 2015-07-06 ENCOUNTER — Telehealth: Payer: Self-pay | Admitting: Neurology

## 2015-07-06 NOTE — Telephone Encounter (Signed)
Spoke with patient. He believes he is breathing out of his mouth and may need fullface mask. Wife says she hears some snoring. Advised pt to schedule a follow-up visit with Dr. Brett Fairy and discuss issues. Instructed patient to bring chip for download.

## 2015-07-07 NOTE — Telephone Encounter (Signed)
Returned pt's phone call. No answer, left message asking him to call me back.

## 2015-07-07 NOTE — Telephone Encounter (Signed)
Spoke to pt. He says his breathing "isn't quite right on cpap". He wants to come in to see Dr. Brett Fairy. Pt hasn't been seen here for cpap since 05/2012. Per Larene Beach and Dr. Brett Fairy, pt does not need a referral, his insurance doesn't require one and Dr. Brett Fairy said it is ok to come in. Made an appt with pt on 8/31 at 9:00. Pt verbalized understanding to arrive 15 minutes early and to bring his cpap machine.

## 2015-07-20 NOTE — Telephone Encounter (Signed)
Error

## 2015-08-03 ENCOUNTER — Telehealth: Payer: Self-pay | Admitting: Neurology

## 2015-08-03 NOTE — Telephone Encounter (Signed)
Spoke to pt and changed his appt to 3:45 on 8/31.

## 2015-08-03 NOTE — Telephone Encounter (Signed)
Pt has a conflict with his work schedule and and appt time on 8/31. He wants to know if he can be worked in that day after 2p on Wed. 8/31. Please call and advise [phone]

## 2015-08-05 ENCOUNTER — Institutional Professional Consult (permissible substitution): Payer: Self-pay | Admitting: Neurology

## 2015-08-05 ENCOUNTER — Encounter (INDEPENDENT_AMBULATORY_CARE_PROVIDER_SITE_OTHER): Payer: Self-pay

## 2015-08-05 ENCOUNTER — Ambulatory Visit (INDEPENDENT_AMBULATORY_CARE_PROVIDER_SITE_OTHER): Payer: 59 | Admitting: Neurology

## 2015-08-05 ENCOUNTER — Encounter: Payer: Self-pay | Admitting: Neurology

## 2015-08-05 VITALS — BP 156/80 | HR 86 | Resp 20 | Ht 70.0 in | Wt 237.0 lb

## 2015-08-05 DIAGNOSIS — E669 Obesity, unspecified: Secondary | ICD-10-CM

## 2015-08-05 DIAGNOSIS — G4733 Obstructive sleep apnea (adult) (pediatric): Secondary | ICD-10-CM

## 2015-08-05 DIAGNOSIS — I63512 Cerebral infarction due to unspecified occlusion or stenosis of left middle cerebral artery: Secondary | ICD-10-CM | POA: Diagnosis not present

## 2015-08-05 DIAGNOSIS — Z9989 Dependence on other enabling machines and devices: Secondary | ICD-10-CM

## 2015-08-05 NOTE — Patient Instructions (Signed)
Sleep Apnea  Sleep apnea is disorder that affects a person's sleep. A person with sleep apnea has abnormal pauses in their breathing when they sleep. It is hard for them to get a good sleep. This makes a person tired during the day. It also can lead to other physical problems. There are three types of sleep apnea. One type is when breathing stops for a short time because your airway is blocked (obstructive sleep apnea). Another type is when the brain sometimes fails to give the normal signal to breathe to the muscles that control your breathing (central sleep apnea). The third type is a combination of the other two types.  HOME CARE  · Do not sleep on your back. Try to sleep on your side.  · Take all medicine as told by your doctor.  · Avoid alcohol, calming medicines (sedatives), and depressant drugs.  · Try to lose weight if you are overweight. Talk to your doctor about a healthy weight goal.  Your doctor may have you use a device that helps to open your airway. It can help you get the air that you need. It is called a positive airway pressure (PAP) device. There are three types of PAP devices:  · Continuous positive airway pressure (CPAP) device.  · Nasal expiratory positive airway pressure (EPAP) device.  · Bilevel positive airway pressure (BPAP) device.  MAKE SURE YOU:  · Understand these instructions.  · Will watch your condition.  · Will get help right away if you are not doing well or get worse.  Document Released: 08/30/2008 Document Revised: 11/07/2012 Document Reviewed: 03/24/2012  ExitCare® Patient Information ©2015 [employer]. This information is not intended to replace advice given to you by your health care provider. Make sure you discuss any questions you have with your health care provider.
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
## 2016-01-01 MED FILL — ALLOPURINOL 300 MG TABLET: 300 | 90 days supply | Qty: 90 | Fill #1

## 2016-01-12 ENCOUNTER — Telehealth: Payer: Self-pay | Admitting: Neurology

## 2016-01-12 NOTE — Telephone Encounter (Signed)
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 He needs ASAP. Advised patient per nurse Erasmo Downer, he will need to contact [facility] for download and chip, patient states when he was here last that is when we discovered he had a corrupt chip, he received new chip from [facility] and after 60 or 90 days me mailed chip back in an envelope to [facility], patient states he never got his chip back and also never received chin strap from [facility]. Advised patient to contact [facility] for download, chip and chin strap. Patient will call [place].

## 2016-01-12 NOTE — Telephone Encounter (Signed)
This is the response I got from [facility] when I reached to them:  I followed up with our RT team and here is what I was advised  "It looks like his card was corrupt back in august. We sent him a card and he sent his back- the download was done in November. I attached the download from nov. The [employer] team also left him a message in November about the chinstrap but it doesn't look like pt ever called [place] back. I will send the download to his PCP for DOT appt and call pt about chinstrap again."  I have the copy of the download if you would like me to bring it by for your records.  Just let me know.

## 2016-01-12 NOTE — Telephone Encounter (Signed)
We do not keep the chip after we find that it is corrupt. I have reached out to [facility] so they can get him a new chip (as was asked of them in August), new chin strap, and a download to be faxed to his PCP.

## 2016-01-16 DIAGNOSIS — G4733 Obstructive sleep apnea (adult) (pediatric): Secondary | ICD-10-CM | POA: Diagnosis not present

## 2016-01-18 MED FILL — OMEGA-3 ETHYL ESTERS 1 GM C: 1 | 90 days supply | Qty: 360 | Fill #1

## 2016-01-18 MED FILL — CLOPIDOGREL 75 MG TABLET: 75 | 90 days supply | Qty: 90 | Fill #0

## 2016-01-26 DIAGNOSIS — M1A09X0 Idiopathic chronic gout, multiple sites, without tophus (tophi): Secondary | ICD-10-CM | POA: Diagnosis not present

## 2016-01-26 DIAGNOSIS — I129 Hypertensive chronic kidney disease with stage 1 through stage 4 chronic kidney disease, or unspecified chronic kidney disease: Secondary | ICD-10-CM | POA: Diagnosis not present

## 2016-01-26 DIAGNOSIS — F5101 Primary insomnia: Secondary | ICD-10-CM | POA: Diagnosis not present

## 2016-01-26 DIAGNOSIS — N183 Chronic kidney disease, stage 3 (moderate): Secondary | ICD-10-CM | POA: Diagnosis not present

## 2016-01-26 DIAGNOSIS — E782 Mixed hyperlipidemia: Secondary | ICD-10-CM | POA: Diagnosis not present

## 2016-01-29 ENCOUNTER — Other Ambulatory Visit: Payer: Self-pay | Admitting: Orthopedic Surgery

## 2016-01-29 MED FILL — CITALOPRAM HBR 20 MG TABLET: 20 | 90 days supply | Qty: 90 | Fill #2

## 2016-01-29 MED FILL — FENOFIBRATE 160 MG TABLET: 160 | 90 days supply | Qty: 90 | Fill #2

## 2016-02-02 ENCOUNTER — Encounter: Payer: Self-pay | Admitting: Adult Health

## 2016-02-02 ENCOUNTER — Ambulatory Visit (INDEPENDENT_AMBULATORY_CARE_PROVIDER_SITE_OTHER): Payer: 59 | Admitting: Adult Health

## 2016-02-02 VITALS — BP 140/82 | HR 76 | Resp 20 | Ht 70.0 in | Wt 245.0 lb

## 2016-02-02 DIAGNOSIS — Z8673 Personal history of transient ischemic attack (TIA), and cerebral infarction without residual deficits: Secondary | ICD-10-CM

## 2016-02-02 DIAGNOSIS — Z9989 Dependence on other enabling machines and devices: Principal | ICD-10-CM

## 2016-02-02 DIAGNOSIS — G4733 Obstructive sleep apnea (adult) (pediatric): Secondary | ICD-10-CM

## 2016-02-02 NOTE — Progress Notes (Signed)
I agree with the assessment and plan as directed by NP .The patient is known to me .   Laryssa Hassing, MD  
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
## 2016-02-02 NOTE — Patient Instructions (Signed)
Continue using CPAP nightly- Insert chip and after 2-3 months bring memory card by the sleep lab for a download Continue Plavix Keep Blood pressure <130/90 Cholesterol LDL <100 Send [place] the letter regarding your surgery and we will indicate when to stop Plavix If your symptoms worsen or you develop new symptoms please let us know.

## 2016-02-03 ENCOUNTER — Ambulatory Visit: Payer: 59 | Admitting: Adult Health

## 2016-02-09 MED FILL — ZOLPIDEM TARTRATE 5 MG TAB: 5 | 90 days supply | Qty: 90 | Fill #0

## 2016-02-09 MED FILL — ROSUVASTATIN CALCIUM 40 MG: 40 | 90 days supply | Qty: 90 | Fill #2

## 2016-02-15 ENCOUNTER — Telehealth: Payer: Self-pay | Admitting: Neurology

## 2016-02-15 NOTE — Telephone Encounter (Signed)
Pt's wife called back, she will fax the form and is requesting a call asap with clearance information. She is aware Dr [name] is out of the office until Wednesday 02/17/16.

## 2016-02-15 NOTE — Telephone Encounter (Signed)
Patient's wife is calling and states she needs paperwork filled out in regard to her husband's surgery on 02/26/16 in regard to his Plavix.  Please call.

## 2016-02-15 NOTE — Telephone Encounter (Signed)
IF patients wife calls back the MD who is doing the surgery will have to fax a clearance form to [phone]. Dr.Sethi will return to the office on Wednesday and can evaluate him for clearance. The Rn does not have anything from patients surgeon.

## 2016-02-15 NOTE — Telephone Encounter (Signed)
Pt's wife called and wants to know about instructions for her husbands plavix. She has the forms. I lost the call connections, and it was dropped

## 2016-02-17 NOTE — Telephone Encounter (Signed)
Rn call patient and [name] did receive clearance form for surgery. Rn explain he is to hold plavix  5 days prior to 02/26/2016 which is surgery date. Rn explain per Dr.Sethi note there is a small periprocedure risk of TIA or stroke. Pt verbalized understanding.

## 2016-02-17 NOTE — Telephone Encounter (Signed)
LFt message for  [name] that clearance form for patient was completed for surgery clearance. Form fax twice and receive.

## 2016-02-18 NOTE — Telephone Encounter (Signed)
[facility] [phone] called to advise clearance form for surgery was received.

## 2016-02-19 ENCOUNTER — Encounter (HOSPITAL_BASED_OUTPATIENT_CLINIC_OR_DEPARTMENT_OTHER): Payer: Self-pay | Admitting: *Deleted

## 2016-02-19 NOTE — Progress Notes (Signed)
   02/19/16 [place]  Have you ever been diagnosed with sleep apnea through a sleep study? Yes  If yes, do you have and use a CPAP or BPAP machine every night? 1  Do you know the presssure settings on your maching? Yes  Do you snore loudly (loud enough to be heard through closed doors)?  0  Do you often feel tired, fatigued, or sleepy during the daytime (such as falling asleep during driving or talking to someone)? 0  Has anyone observed you stop breathing during your sleep? 0  Do you have, or are you being treated for high blood pressure? 1  BMI more than 35 kg/m2? 1  Age > 50 (1-yes) 1  Neck circumference greater than:Male 16 inches or larger, Male 17inches or larger? 0  Male Gender (Yes=1) 1  Obstructive Sleep Apnea Score 4

## 2016-02-26 ENCOUNTER — Encounter (HOSPITAL_BASED_OUTPATIENT_CLINIC_OR_DEPARTMENT_OTHER)
Admission: RE | Disposition: A | Discharge status: Home / Self Care | Payer: Self-pay | Source: Ambulatory Visit | Attending: Orthopedic Surgery

## 2016-02-26 ENCOUNTER — Encounter (HOSPITAL_BASED_OUTPATIENT_CLINIC_OR_DEPARTMENT_OTHER): Payer: Self-pay | Admitting: *Deleted

## 2016-02-26 ENCOUNTER — Ambulatory Visit (HOSPITAL_BASED_OUTPATIENT_CLINIC_OR_DEPARTMENT_OTHER): Payer: 59 | Admitting: Anesthesiology

## 2016-02-26 ENCOUNTER — Ambulatory Visit (HOSPITAL_BASED_OUTPATIENT_CLINIC_OR_DEPARTMENT_OTHER)
Admission: RE | Admit: 2016-02-26 | Discharge: 2016-02-26 | Disposition: A | Discharge status: Home / Self Care | Payer: 59 | Source: Ambulatory Visit | Attending: Orthopedic Surgery | Admitting: Orthopedic Surgery

## 2016-02-26 DIAGNOSIS — Z87891 Personal history of nicotine dependence: Secondary | ICD-10-CM | POA: Diagnosis not present

## 2016-02-26 DIAGNOSIS — N189 Chronic kidney disease, unspecified: Secondary | ICD-10-CM | POA: Diagnosis not present

## 2016-02-26 DIAGNOSIS — I739 Peripheral vascular disease, unspecified: Secondary | ICD-10-CM | POA: Diagnosis not present

## 2016-02-26 DIAGNOSIS — Z85528 Personal history of other malignant neoplasm of kidney: Secondary | ICD-10-CM | POA: Diagnosis not present

## 2016-02-26 DIAGNOSIS — I10 Essential (primary) hypertension: Secondary | ICD-10-CM | POA: Diagnosis not present

## 2016-02-26 DIAGNOSIS — F329 Major depressive disorder, single episode, unspecified: Secondary | ICD-10-CM | POA: Insufficient documentation

## 2016-02-26 DIAGNOSIS — G4730 Sleep apnea, unspecified: Secondary | ICD-10-CM | POA: Diagnosis not present

## 2016-02-26 DIAGNOSIS — Z96652 Presence of left artificial knee joint: Secondary | ICD-10-CM | POA: Insufficient documentation

## 2016-02-26 DIAGNOSIS — I2510 Atherosclerotic heart disease of native coronary artery without angina pectoris: Secondary | ICD-10-CM | POA: Diagnosis not present

## 2016-02-26 DIAGNOSIS — M1990 Unspecified osteoarthritis, unspecified site: Secondary | ICD-10-CM | POA: Diagnosis not present

## 2016-02-26 DIAGNOSIS — G5602 Carpal tunnel syndrome, left upper limb: Secondary | ICD-10-CM | POA: Insufficient documentation

## 2016-02-26 DIAGNOSIS — E7800 Pure hypercholesterolemia, unspecified: Secondary | ICD-10-CM | POA: Insufficient documentation

## 2016-02-26 DIAGNOSIS — I129 Hypertensive chronic kidney disease with stage 1 through stage 4 chronic kidney disease, or unspecified chronic kidney disease: Secondary | ICD-10-CM | POA: Diagnosis not present

## 2016-02-26 DIAGNOSIS — Z8673 Personal history of transient ischemic attack (TIA), and cerebral infarction without residual deficits: Secondary | ICD-10-CM | POA: Diagnosis not present

## 2016-02-26 HISTORY — PX: CARPAL TUNNEL RELEASE: SHX101

## 2016-02-26 SURGERY — CARPAL TUNNEL RELEASE
Anesthesia: Monitor Anesthesia Care | Site: Wrist | Laterality: Left

## 2016-02-26 MED ORDER — FENTANYL CITRATE (PF) 100 MCG/2ML IJ SOLN
INTRAMUSCULAR | Status: AC
  Filled 2016-02-26: qty 2

## 2016-02-26 MED ORDER — BUPIVACAINE HCL (PF) 0.25 % IJ SOLN
INTRAMUSCULAR | Status: AC
  Filled 2016-02-26: qty 90

## 2016-02-26 MED ORDER — HYDROMORPHONE HCL 1 MG/ML IJ SOLN: 0.2500 mg | INTRAMUSCULAR | Status: DC | PRN

## 2016-02-26 MED ORDER — SCOPOLAMINE 1 MG/3DAYS TD PT72: 1.0000 | MEDICATED_PATCH | Freq: Once | TRANSDERMAL | Status: DC | PRN

## 2016-02-26 MED ORDER — CEFAZOLIN SODIUM 1-5 GM-% IV SOLN
INTRAVENOUS | Status: AC
  Filled 2016-02-26: qty 50

## 2016-02-26 MED ORDER — SODIUM BICARBONATE 4 % IV SOLN
INTRAVENOUS | Status: AC
  Filled 2016-02-26: qty 10

## 2016-02-26 MED ORDER — LIDOCAINE HCL (PF) 1 % IJ SOLN
INTRAMUSCULAR | Status: AC
  Filled 2016-02-26: qty 60

## 2016-02-26 MED ORDER — CEFAZOLIN SODIUM-DEXTROSE 2-4 GM/100ML-% IV SOLN
INTRAVENOUS | Status: AC
  Filled 2016-02-26: qty 100

## 2016-02-26 MED ORDER — BETAMETHASONE SOD PHOS & ACET 6 (3-3) MG/ML IJ SUSP
INTRAMUSCULAR | Status: AC
  Filled 2016-02-26: qty 1

## 2016-02-26 MED ORDER — MIDAZOLAM HCL 2 MG/2ML IJ SOLN
1.0000 mg | INTRAMUSCULAR | Status: DC | PRN
  Administered 2016-02-26: 1 mg via INTRAVENOUS

## 2016-02-26 MED ORDER — DEXTROSE 5 % IV SOLN
2.0000 g | INTRAVENOUS | Status: AC
  Administered 2016-02-26: 2 g via INTRAVENOUS

## 2016-02-26 MED ORDER — LIDOCAINE HCL 1 % IJ SOLN
INTRAMUSCULAR | Status: DC | PRN
  Administered 2016-02-26: 10 mL via INTRAMUSCULAR

## 2016-02-26 MED ORDER — OXYCODONE HCL 5 MG/5ML PO SOLN: 5.0000 mg | Freq: Once | ORAL | Status: DC | PRN

## 2016-02-26 MED ORDER — LACTATED RINGERS IV SOLN
INTRAVENOUS | Status: DC
Start: 2016-02-26 — End: 2016-02-26
  Administered 2016-02-26: 07:00:00 via INTRAVENOUS

## 2016-02-26 MED ORDER — MEPERIDINE HCL 25 MG/ML IJ SOLN: 6.2500 mg | INTRAMUSCULAR | Status: DC | PRN

## 2016-02-26 MED ORDER — GLYCOPYRROLATE 0.2 MG/ML IJ SOLN: 0.2000 mg | Freq: Once | INTRAMUSCULAR | Status: DC | PRN

## 2016-02-26 MED ORDER — OXYCODONE HCL 5 MG PO TABS: 5.0000 mg | ORAL_TABLET | Freq: Once | ORAL | Status: DC | PRN

## 2016-02-26 MED ORDER — CHLORHEXIDINE GLUCONATE 4 % EX LIQD: 60.0000 mL | Freq: Once | CUTANEOUS | Status: DC

## 2016-02-26 MED ORDER — MIDAZOLAM HCL 2 MG/2ML IJ SOLN
INTRAMUSCULAR | Status: AC
  Filled 2016-02-26: qty 2

## 2016-02-26 MED ORDER — FENTANYL CITRATE (PF) 100 MCG/2ML IJ SOLN
50.0000 ug | INTRAMUSCULAR | Status: DC | PRN
  Administered 2016-02-26: 50 ug via INTRAVENOUS

## 2016-02-26 MED ORDER — OXYCODONE HCL 5 MG PO TABS: 10.0000 mg | ORAL_TABLET | ORAL | Status: DC | PRN

## 2016-02-26 MED ORDER — PROMETHAZINE HCL 25 MG/ML IJ SOLN: 6.2500 mg | INTRAMUSCULAR | Status: DC | PRN

## 2016-02-26 MED FILL — oxyCODONE HCL 5 MG TABS: 5 | 4 days supply | Qty: 45 | Fill #0

## 2016-02-26 SURGICAL SUPPLY — 44 items
BANDAGE ACE 3X5.8 VEL STRL LF (GAUZE/BANDAGES/DRESSINGS) ×2 IMPLANT
BLADE CARPAL TUNNEL SNGL USE (BLADE) ×2 IMPLANT
BLADE SURG 15 STRL LF DISP TIS (BLADE) ×2 IMPLANT
BLADE SURG 15 STRL SS (BLADE) ×2
BNDG CONFORM 3 STRL LF (GAUZE/BANDAGES/DRESSINGS) ×2 IMPLANT
BRUSH SCRUB EZ PLAIN DRY (MISCELLANEOUS) ×2 IMPLANT
CORDS BIPOLAR (ELECTRODE) ×2 IMPLANT
COVER BACK TABLE 60X90IN (DRAPES) ×2 IMPLANT
CUFF TOURNIQUET SINGLE 18IN (TOURNIQUET CUFF) ×2 IMPLANT
DRAIN PENROSE 1/4X12 LTX STRL (WOUND CARE) IMPLANT
DRAPE EXTREMITY T 121X128X90 (DRAPE) ×2 IMPLANT
DRAPE SURG 17X23 STRL (DRAPES) ×2 IMPLANT
DRSG EMULSION OIL 3X3 NADH (GAUZE/BANDAGES/DRESSINGS) ×2 IMPLANT
GAUZE SPONGE 4X4 12PLY STRL (GAUZE/BANDAGES/DRESSINGS) IMPLANT
GAUZE SPONGE 4X4 16PLY XRAY LF (GAUZE/BANDAGES/DRESSINGS) IMPLANT
GAUZE XEROFORM 1X8 LF (GAUZE/BANDAGES/DRESSINGS) ×2 IMPLANT
GLOVE BIOGEL M STRL SZ7.5 (GLOVE) IMPLANT
GLOVE SS BIOGEL STRL SZ 8 (GLOVE) ×1 IMPLANT
GLOVE SUPERSENSE BIOGEL SZ 8 (GLOVE) ×1
GOWN STRL REUS W/ TWL LRG LVL3 (GOWN DISPOSABLE) ×1 IMPLANT
GOWN STRL REUS W/ TWL XL LVL3 (GOWN DISPOSABLE) ×1 IMPLANT
GOWN STRL REUS W/TWL LRG LVL3 (GOWN DISPOSABLE) ×1
GOWN STRL REUS W/TWL XL LVL3 (GOWN DISPOSABLE) ×1
LOOP VESSEL MAXI BLUE (MISCELLANEOUS) IMPLANT
NDL SAFETY ECLIPSE 18X1.5 (NEEDLE) ×1 IMPLANT
NEEDLE HYPO 18GX1.5 SHARP (NEEDLE) ×1
NEEDLE HYPO 22GX1.5 SAFETY (NEEDLE) IMPLANT
NEEDLE HYPO 25X1 1.5 SAFETY (NEEDLE) ×4 IMPLANT
NS IRRIG 1000ML POUR BTL (IV SOLUTION) ×2 IMPLANT
PACK BASIN DAY SURGERY FS (CUSTOM PROCEDURE TRAY) ×2 IMPLANT
PAD ALCOHOL SWAB (MISCELLANEOUS) ×16 IMPLANT
PAD CAST 3X4 CTTN HI CHSV (CAST SUPPLIES) ×2 IMPLANT
PADDING CAST ABS 4INX4YD NS (CAST SUPPLIES) ×1
PADDING CAST ABS COTTON 4X4 ST (CAST SUPPLIES) ×1 IMPLANT
PADDING CAST COTTON 3X4 STRL (CAST SUPPLIES) ×2
SHEET MEDIUM DRAPE 40X70 STRL (DRAPES) ×2 IMPLANT
STOCKINETTE 4X48 STRL (DRAPES) ×2 IMPLANT
SUT PROLENE 4 0 PS 2 18 (SUTURE) ×2 IMPLANT
SYR BULB 3OZ (MISCELLANEOUS) ×2 IMPLANT
SYR CONTROL 10ML LL (SYRINGE) ×4 IMPLANT
TOWEL OR 17X24 6PK STRL BLUE (TOWEL DISPOSABLE) ×2 IMPLANT
TOWEL OR NON WOVEN STRL DISP B (DISPOSABLE) ×2 IMPLANT
TRAY DSU PREP LF (CUSTOM PROCEDURE TRAY) ×2 IMPLANT
UNDERPAD 30X30 (UNDERPADS AND DIAPERS) ×2 IMPLANT

## 2016-02-26 NOTE — Transfer of Care (Signed)
Immediate Anesthesia Transfer of Care Note  Patient: [name]  Procedure(s) Performed: Procedure(s): LEFT LIMITED OPEN CARPAL TUNNEL RELEASE (Left)  Patient Location: PACU  Anesthesia Type:MAC  Level of Consciousness: awake, alert  and oriented  Airway & Oxygen Therapy: Patient Spontanous Breathing  Post-op Assessment: Report given to RN and Post -op Vital signs reviewed and stable  Post vital signs: Reviewed and stable  Last Vitals:  Filed Vitals:   02/26/16 0630  BP: 138/73  Pulse: 59  Temp: 36.6 C  Resp: 18    Complications: No apparent anesthesia complications

## 2016-02-26 NOTE — Anesthesia Postprocedure Evaluation (Signed)
Anesthesia Post Note  Patient: [name]  Procedure(s) Performed: Procedure(s) (LRB): LEFT LIMITED OPEN CARPAL TUNNEL RELEASE (Left)  Patient location during evaluation: PACU Anesthesia Type: MAC Level of consciousness: awake and alert Pain management: pain level controlled Vital Signs Assessment: post-procedure vital signs reviewed and stable Respiratory status: spontaneous breathing Cardiovascular status: stable Anesthetic complications: no    Last Vitals:  Filed Vitals:   02/26/16 0907 02/26/16 0924  BP:  125/79  Pulse: 54 50  Temp:  36.7 C  Resp: 14 16    Last Pain:  Filed Vitals:   02/26/16 0927  PainSc: 0-No pain                 Nolon Nations

## 2016-02-26 NOTE — Anesthesia Preprocedure Evaluation (Addendum)
Anesthesia Evaluation  Patient identified by MRN, date of birth, ID band Patient awake    Reviewed: Allergy & Precautions, H&P , NPO status , Patient's Chart, lab work & pertinent test results  Airway Mallampati: II  TM Distance: >3 FB Neck ROM: Full    Dental no notable dental hx.    Pulmonary sleep apnea , former smoker,    Pulmonary exam normal breath sounds clear to auscultation       Cardiovascular hypertension, Pt. on medications + CAD and + Peripheral Vascular Disease  Normal cardiovascular exam Rhythm:Regular Rate:Normal     Neuro/Psych Seizures -, Well Controlled,  PSYCHIATRIC DISORDERS Depression Middle cerebral artery stenosis TIACVA, No Residual Symptoms    GI/Hepatic negative GI ROS, Neg liver ROS,   Endo/Other  negative endocrine ROS  Renal/GU Renal disease     Musculoskeletal negative musculoskeletal ROS (+) Arthritis ,   Abdominal   Peds  Hematology negative hematology ROS (+) anemia ,   Anesthesia Other Findings   Reproductive/Obstetrics                             Anesthesia Physical  Anesthesia Plan  ASA: III  Anesthesia Plan: MAC   Post-op Pain Management:    Induction: Intravenous  Airway Management Planned: Simple Face Mask  Additional Equipment:   Intra-op Plan:   Post-operative Plan:   Informed Consent: I have reviewed the patients History and Physical, chart, labs and discussed the procedure including the risks, benefits and alternatives for the proposed anesthesia with the patient or authorized representative who has indicated his/her understanding and acceptance.   Dental advisory given  Plan Discussed with: CRNA  Anesthesia Plan Comments:        Anesthesia Quick Evaluation

## 2016-02-26 NOTE — Op Note (Signed)
NAME:  [name], Keith NO.:  [id number]  MEDICAL RECORD NO.:  [name]:[id number]  LOCATION:                                 [name]:  [name]:  [name]. [name], M.D.DATE OF BIRTH:  Apr 21, 1956  DATE OF PROCEDURE: DATE OF DISCHARGE:                              OPERATIVE REPORT   PREOPERATIVE DIAGNOSIS:  Left carpal tunnel syndrome.  POSTOPERATIVE DIAGNOSIS:  Left carpal tunnel syndrome.  PROCEDURES: 1. Left median nerve/peripheral nerve block at wrist, forearm level     for anesthetic purposes for carpal tunnel release. 2. Left limited open carpal tunnel release.  SURGEON:  [name]. Amedeo Plenty, MD.  ASSISTANT:  None.  COMPLICATIONS:  None.  ANESTHESIA:  Peripheral nerve block with IV sedation keeping the patient awake, alert, and oriented the entire case.  TOURNIQUET TIME:  Less than 10 minutes.  INDICATIONS:  A pleasant male, who is 60 years of age, who presents with the above-mentioned diagnosis.  I have counseled him in regard to risks and benefits of surgery and he desires to proceed with the above- mentioned operative intervention.  OPERATION IN DETAIL:  The patient was seen by myself and Anesthesia, taken to the operative theater and underwent smooth induction of peripheral nerve/median nerve block.  He was prepped and draped in usual sterile fashion with Betadine scrub and paint.  Following this, the patient then underwent a very careful and cautious approach to the extremity with elevation of the arm followed by insufflation of the tourniquet to 250 mmHg.  Time-out had been observed.  Preoperative antibiotics were given.  At this time, we made an incision 1 to 1.5 cm at the distal edge of the transverse carpal ligament coursing proximally.  Dissection was carried down.  He had a very thick palmar fascia which was incised accessed the transverse carpal ligament and then dissected about this region.  Transverse carpal ligament was identified and  released under 4.0 loupe magnification, dissected distally and made sure that all of the distal portions of the ligament were released.  This was done without difficulty.  There were no complicating features following complete distal release and verification of the fat pad aggression.  I then dissected in a distal to proximal direction until adequate room was available for canal, prepared toward device 1, 2, and 3 which were placed just under the proximal leading leaflet of the transverse carpal ligament.  The patient tolerated this well.  Once this was complete, with the patient awake security clip was placed, obturator disengaged, security knife was placed and security clip effectively releasing the proximal leaflet.  The patient tolerated this well.  There were no complicating features.  Once this was complete, we then performed very careful and cautious inspection of the canal.  The patient was nicely released without difficulty via the security clip technique.  The patient had irrigation applied. Tourniquet deflated.  Hemostasis secured and wound closed with Prolene.  This was an uncomplicated carpal tunnel release.  She was awake, alert and oriented the entire case and had no pain or problems.  He will be monitored in the recovery room, [place] [facility], oxycodone for pain, begin his Plavix tomorrow, notify me should any problems  occur.  These notes have been discussed.  All questions have been addressed.     [name]. Amedeo Plenty, M.D.     [facility]  D:  02/26/2016  T:  02/26/2016  Job:  [id number]

## 2016-02-26 NOTE — H&P (Signed)
[name] is an 60 y.o. male.   Chief Complaint: Left carpal tunnel syndrome HPI: Patient presents for left carpal tunnel release Patient presents for evaluation and treatment of the of their upper extremity predicament. The patient denies neck, back, chest or  abdominal pain. The patient notes that they have no lower extremity problems. The patients primary complaint is noted. We are planning surgical care pathway for the upper extremity. Past Medical History  Diagnosis Date  . Renal cell carcinoma   . Hypertension   . High cholesterol   . Arthritis   . Depression   . Sleep apnea     CPAP every night  . Stroke ([place]) 11/03/2011    no deficits  . Seizures ([place])     had partial seizures after CVA,on Keppra. Dr [name] stopped med, no seizures since    Past Surgical History  Procedure Laterality Date  . Nephrectomy    . Carpal tunnel release    . Knee arthroscopy    . Anterior cruciate ligament repair    . Biceps tendon repair    . Total knee arthroplasty  11/16/2012    Procedure: TOTAL KNEE ARTHROPLASTY;  Surgeon: Keith Rossetti, MD;  Location: [facility];  Service: Orthopedics;  Laterality: Left;  Left Total Knee Arthroplasty    Family History  Problem Relation Age of Onset  . Coronary artery disease Father    Social History:  reports that he quit smoking about 27 years ago. His smoking use included Cigarettes. He quit after 2 years of use. He has never used smokeless tobacco. He reports that he drinks alcohol. He reports that he does not use illicit drugs.  Allergies: No Known Allergies  Medications Prior to Admission  Medication Sig Dispense Refill  . allopurinol (ZYLOPRIM) 300 MG tablet Take 300 mg by mouth daily.    Marland Kitchen amLODipine-olmesartan (AZOR) 5-40 MG per tablet Take 1 tablet by mouth every morning.     . citalopram (CELEXA) 20 MG tablet TAKE 1 TABLET EVERY DAY 90 tablet 1  . clopidogrel (PLAVIX) 75 MG tablet TAKE 1 TABLET EVERY DAY 90 tablet 1  .  colchicine (COLCRYS) 0.6 MG tablet Take 0.6 mg by mouth daily.    . rosuvastatin (CRESTOR) 40 MG tablet Take 40 mg by mouth every morning.    . zolpidem (AMBIEN) 5 MG tablet Take 5 mg by mouth at bedtime as needed.    . fenofibrate 160 MG tablet Take 160 mg by mouth daily.      No results found for this or any previous visit (from the past 48 hour(s)). No results found.  ROS  Blood pressure 138/73, pulse 59, temperature 97.8 F (36.6 C), temperature source Oral, resp. rate 18, height 5\' 10"  (1.778 m), weight 111.131 kg (245 lb), SpO2 100 %. Physical Exam left carpal tunnel syndrome with positive Tinel's Phalen's and median nerve compression test.  He is neurovascularly intact about the right upper extremity which is previously undergone a carpal tunnel release by myself  The patient is alert and oriented in no acute distress. The patient complains of pain in the affected upper extremity.  The patient is noted to have a normal HEENT exam. Lung fields show equal chest expansion and no shortness of breath. Abdomen exam is nontender without distention. Lower extremity examination does not show any fracture dislocation or blood clot symptoms. Pelvis is stable and the neck and back are stable and nontender.  Assessment/Plan Plan for left carpal tunnel release We are  planning surgery for your upper extremity. The risk and benefits of surgery to include risk of bleeding, infection, anesthesia,  damage to normal structures and failure of the surgery to accomplish its intended goals of relieving symptoms and restoring function have been discussed in detail. With this in mind we plan to proceed. I have specifically discussed with the patient the pre-and postoperative regime and the dos and don'ts and risk and benefits in great detail. Risk and benefits of surgery also include risk of dystrophy(CRPS), chronic nerve pain, failure of the healing process to go onto completion and other inherent risks of  surgery The relavent the pathophysiology of the disease/injury process, as well as the alternatives for treatment and postoperative course of action has been discussed in great detail with the patient who desires to proceed.  We will do everything in our power to help you (the patient) restore function to the upper extremity. It is a pleasure to see this patient today.  Keith Floor, MD 02/26/2016, 7:42 AM

## 2016-02-26 NOTE — Op Note (Signed)
See [phone] [facility].

## 2016-02-26 NOTE — Discharge Instructions (Signed)
Elevate and keep your bandage clean and dry  Please perform general range of motion to the fingers hourly while you're awake  Please call for any problems  Your operation went very smoothly  Keep bandage clean and dry.  Call for any problems.  No smoking.  Criteria for driving a car: you should be off your pain medicine for 7-8 hours, able to drive one handed(confident), thinking clearly and feeling able in your judgement to drive. Continue elevation as it will decrease swelling.  If instructed by MD move your fingers within the confines of the bandage/splint.  Use ice if instructed by your MD. Call immediately for any sudden loss of feeling in your hand/arm or change in functional abilities of the extremity.We recommend that you to take vitamin C 1000 mg a day to promote healing. We also recommend that if you require  pain medicine that you take a stool softener to prevent constipation as most pain medicines will have constipation side effects. We recommend either Peri-Colace or Senokot and recommend that you also consider adding MiraLAX as well to prevent the constipation affects from pain medicine if you are required to use them. These medicines are over the counter and may be purchased at a local pharmacy. A cup of yogurt and a probiotic can also be helpful during the recovery process as the medicines can disrupt your intestinal environment.   Post Anesthesia Home Care Instructions  Activity: Get plenty of rest for the remainder of the day. A responsible adult should stay with you for 24 hours following the procedure.  For the next 24 hours, DO NOT: -Drive a car -Paediatric nurse -Drink alcoholic beverages -Take any medication unless instructed by your physician -Make any legal decisions or sign important papers.  Meals: Start with liquid foods such as gelatin or soup. Progress to regular foods as tolerated. Avoid greasy, spicy, heavy foods. If nausea and/or vomiting occur, drink only  clear liquids until the nausea and/or vomiting subsides. Call your physician if vomiting continues.  Special Instructions/Symptoms: Your throat may feel dry or sore from the anesthesia or the breathing tube placed in your throat during surgery. If this causes discomfort, gargle with warm salt water. The discomfort should disappear within 24 hours.  If you had a scopolamine patch placed behind your ear for the management of post- operative nausea and/or vomiting:  1. The medication in the patch is effective for 72 hours, after which it should be removed.  Wrap patch in a tissue and discard in the trash. Wash hands thoroughly with soap and water. 2. You may remove the patch earlier than 72 hours if you experience unpleasant side effects which may include dry mouth, dizziness or visual disturbances. 3. Avoid touching the patch. Wash your hands with soap and water after contact with the patch.

## 2016-02-26 NOTE — Anesthesia Procedure Notes (Signed)
Procedure Name: MAC Performed by: Terrance Mass Pre-anesthesia Checklist: Timeout performed, Patient identified, Emergency Drugs available, Suction available and Patient being monitored Patient Re-evaluated:Patient Re-evaluated prior to inductionOxygen Delivery Method: Simple face mask

## 2016-02-29 ENCOUNTER — Encounter (HOSPITAL_BASED_OUTPATIENT_CLINIC_OR_DEPARTMENT_OTHER): Payer: Self-pay | Admitting: Orthopedic Surgery

## 2016-03-04 MED FILL — COLCHICINE 0.6 MG TABLET: 0.6 | 30 days supply | Qty: 30 | Fill #0

## 2016-03-04 MED FILL — AMLODIPINE-OLMESARTAN 5-40: 5-40 | 90 days supply | Qty: 90 | Fill #1

## 2016-03-09 ENCOUNTER — Encounter: Payer: Self-pay | Admitting: Cardiology

## 2016-03-09 ENCOUNTER — Ambulatory Visit (INDEPENDENT_AMBULATORY_CARE_PROVIDER_SITE_OTHER): Payer: 59 | Admitting: Cardiology

## 2016-03-09 ENCOUNTER — Encounter: Payer: Self-pay | Admitting: *Deleted

## 2016-03-09 VITALS — BP 132/74 | HR 66 | Ht 70.0 in | Wt 249.8 lb

## 2016-03-09 DIAGNOSIS — G5602 Carpal tunnel syndrome, left upper limb: Secondary | ICD-10-CM | POA: Diagnosis not present

## 2016-03-09 DIAGNOSIS — R9439 Abnormal result of other cardiovascular function study: Secondary | ICD-10-CM | POA: Diagnosis not present

## 2016-03-09 DIAGNOSIS — I2584 Coronary atherosclerosis due to calcified coronary lesion: Secondary | ICD-10-CM

## 2016-03-09 DIAGNOSIS — Z01812 Encounter for preprocedural laboratory examination: Secondary | ICD-10-CM | POA: Diagnosis not present

## 2016-03-09 DIAGNOSIS — I2510 Atherosclerotic heart disease of native coronary artery without angina pectoris: Secondary | ICD-10-CM

## 2016-03-09 NOTE — Progress Notes (Signed)
[name]. [address], [place]  [address] Phone: [phone] Fax:  [phone]  Date:  03/09/2016   ID:  [name], DOB Dec 17, 1955, MRN [id number]  PCP:  Keith Kid, MD   History of Present Illness: [name] is a 60 y.o. male [name]'s brother) who was referred after CT scan demonstrated coronary artery calcification (multivessel - LAD, Circ, RCA) as well as aortic arch calcification. CT scan was personally viewed.  He underwent right radical nephrectomy and pericaval lymph node dissection on 12/22/10. He also had a hypotensive episode following knee replacement which resulted in TIA. 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 Since he was having sensation of seizures, prodrome of possible salty taste in his mouth, Keppra was being utilized by Dr. Leonie Huynh. The CT scan also revealed small bilateral lung nodules that were most likely inflammatory in nature. He was recommended to repeat.   He has no prior history of MI. He has had a radical nephrectomy, anterior cruciate ligament surgery as well as biceps tendon surgery. He has a history of GERD, arthritis and a history of TIA recorded.   He also has a history of hyperlipidemia for which he is taking Crestor. 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 He is also on good antihypertensive medications. He is a Engineer, structural, nonsmoker. His creatinine is 1.1. EKG, personally viewed from November 13, 2012 shows sinus bradycardia rate 56 with no other abnormalities.  He underwent nuclear stress test which did not show any evidence of perfusion abnormality, TID. His exercise treadmill portion of the test however did demonstrate ST segment depression diffusely.  Previously we had a lengthy discussion with he and his wife about the possible implications of this including a discussion of balanced  ischemia. We also had a discussion on continued prevention strategies with hypertension, hyperlipidemia and antiplatelet agent. After this discussion, he decided to continue with prevention strategy and not proceed with angiography. I did clearly state that if symptoms were to arise, that I would have a low threshold for angiography. They both understand the possibility still exists for triple-vessel disease.  03/09/16- no CP, no SOB per report although he does not is patent heavy cardiovascular activity. No changes in symptoms. Wife requested repeat visit. We discussed cardiac catheterization again. He has a big season about to start with arm, underwent left carpal tunnel surgery.   Wt Readings from Last 3 Encounters:  03/09/16 249 lb 12.8 oz (113.309 kg)  02/26/16 245 lb (111.131 kg)  02/02/16 245 lb (111.131 kg)     Past Medical History  Diagnosis Date  . Renal cell carcinoma   . Hypertension   . High cholesterol   . Arthritis   . Depression   . Sleep apnea     CPAP every night  . Stroke ([place]) 11/03/2011    no deficits  . 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 Dr [name] stopped med, no seizures since    Past Surgical History  Procedure Laterality Date  . Nephrectomy    . Carpal tunnel release    . Knee arthroscopy    . Anterior cruciate ligament repair    . Biceps tendon repair    . Total knee arthroplasty  11/16/2012    Procedure: TOTAL KNEE ARTHROPLASTY;  Surgeon: Keith Rossetti, MD;  Location: [facility];  Service:  Orthopedics;  Laterality: Left;  Left Total Knee Arthroplasty  . Carpal tunnel release Left 02/26/2016    Procedure: LEFT LIMITED OPEN CARPAL TUNNEL RELEASE;  Surgeon: Keith Kaufman, MD;  Location: [facility];  Service: Orthopedics;  Laterality: Left;    Current Outpatient Prescriptions  Medication Sig Dispense Refill  . allopurinol (ZYLOPRIM) 300 MG tablet Take 300 mg by mouth daily.    Marland Kitchen amLODipine-olmesartan (AZOR)  5-40 MG per tablet Take 1 tablet by mouth every morning.     . citalopram (CELEXA) 20 MG tablet Take 20 mg by mouth daily.    . clopidogrel (PLAVIX) 75 MG tablet TAKE 1 TABLET EVERY DAY 90 tablet 1  . colchicine (COLCRYS) 0.6 MG tablet Take 0.6 mg by mouth daily.    . fenofibrate 160 MG tablet Take 160 mg by mouth daily.    Marland Kitchen oxyCODONE (OXY IR/ROXICODONE) 5 MG immediate release tablet Take 2 tablets (10 mg total) by mouth every 4 (four) hours as needed for moderate pain or severe pain. 45 tablet 0  . rosuvastatin (CRESTOR) 40 MG tablet Take 40 mg by mouth every morning.    . zolpidem (AMBIEN) 5 MG tablet Take 5 mg by mouth at bedtime as needed for sleep.      No current facility-administered medications for this visit.    Allergies:   No Known Allergies  Social History:  The patient  reports that he quit smoking about 27 years ago. His smoking use included Cigarettes. He quit after 2 years of use. He has never used smokeless tobacco. He reports that he drinks alcohol. He reports that he does not use illicit drugs.   ROS:  Please see the history of present illness.   Positive gout right first toe. No chest pain, no shortness of breath. Positive obesity. No bleeding.    PHYSICAL EXAM: VS:  Ht 5\' 10"  (1.778 m)  Wt 249 lb 12.8 oz (113.309 kg)  BMI 35.84 kg/m2 Well nourished, well developed, in no acute distress HEENT: normal Neck: no JVD Cardiac:  normal S1, S2; RRR; no murmur Lungs:  clear to auscultation bilaterally, no wheezing, rhonchi or rales Abd: soft, nontender, no hepatomegaly Ext: no edema Skin: warm and dry Neuro: no focal abnormalities noted  EKG:  EKG ordered today 03/09/16-sinus rhythm, 67, no obvious abnormalities. Personally viewed-prior 01/07/14 Sinus rhythm, no other changes. Heart rate 77.     ASSESSMENT AND PLAN:  1. Coronary artery calcification with worrisome ST segment depression on ETT. My concern is about possibility of bowels ischemia. Today strongly encouraged  pursuit of coronary angiography to exclude triple-vessel disease. Understands risk. Discussed the risk of stroke, heart attack, renal impairment, bleeding, orthopnea, PND. Willing to proceed. 2. Hyperlipidemia-his sister, Keith Piety, has familial hyperlipidemia. He is currently on high-dose Crestor. LDL goal would be less than 70 for him. PCP currently monitoring. 3. Obesity-encourage weight loss. 4. Single kidney/chronic kidney disease stage III-creatinine 1.5 in 2013. Avoiding NSAIDs. Taking prednisone for gouty flareups. We discussed this is a risk for contrast-induced nephropathy. 5. We will see back post catheterization, orders have been placed.  Signed, Candee Furbish, MD [facility]  03/09/2016 9:49 AM

## 2016-03-09 NOTE — Patient Instructions (Addendum)
Medication Instructions:  Your physician recommends that you continue on your current medications as directed. Please refer to the Current Medication list given to you today.  Labwork: Your physician recommends that you return for lab work on: 03/22/2016 for pre procedure labs   Testing/Procedures: Your physician has requested that you have a cardiac catheterization. Cardiac catheterization is used to diagnose and/or treat various heart conditions. Doctors may recommend this procedure for a number of different reasons. The most common reason is to evaluate chest pain. Chest pain can be a symptom of coronary artery disease (CAD), and cardiac catheterization can show whether plaque is narrowing or blocking your heart's arteries. This procedure is also used to evaluate the valves, as well as measure the blood flow and oxygen levels in different parts of your heart. For further information please visit [email or web address]. Please follow instruction sheet, as given.  Follow-Up: Your physician recommends that you schedule a follow-up appointment in: 2 weeks, after catheterization procedure on 03/25/16, with Dr. Marlou Porch.  If you need a refill on your cardiac medications before your next appointment, please call your pharmacy.  Thank you for choosing [facility]!!

## 2016-03-21 ENCOUNTER — Telehealth: Payer: Self-pay | Admitting: Cardiology

## 2016-03-21 NOTE — Telephone Encounter (Signed)
New Message:  Pt's wife called in wanting to cancel the pt's Catheterization procedure due to a recent death in the family and his father just having a stroke. He will call back to reschedule this at a later date.

## 2016-03-21 NOTE — Telephone Encounter (Signed)
NotedBarbaraann Rondo at cath lab aware to cancel for now.

## 2016-03-22 ENCOUNTER — Other Ambulatory Visit: Payer: 59

## 2016-03-25 ENCOUNTER — Ambulatory Visit (HOSPITAL_COMMUNITY): Admission: RE | Admit: 2016-03-25 | Payer: 59 | Source: Ambulatory Visit | Admitting: Cardiology

## 2016-03-25 ENCOUNTER — Encounter (HOSPITAL_COMMUNITY): Admission: RE | Payer: Self-pay | Source: Ambulatory Visit

## 2016-03-25 SURGERY — RIGHT HEART CATH

## 2016-04-01 MED FILL — ALLOPURINOL 300 MG TABLET: 300 | 90 days supply | Qty: 90 | Fill #2

## 2016-04-06 ENCOUNTER — Ambulatory Visit: Payer: 59 | Admitting: Cardiology

## 2016-04-20 MED FILL — CLOPIDOGREL 75 MG TABLET: 75 | 90 days supply | Qty: 90 | Fill #1

## 2016-04-25 MED FILL — OMEGA-3 ETHYL ESTERS 1 GM C: 1 | 90 days supply | Qty: 360 | Fill #2

## 2016-04-25 MED FILL — FENOFIBRATE 160 MG TABLET: 160 | 90 days supply | Qty: 90 | Fill #3

## 2016-04-25 MED FILL — CITALOPRAM HBR 20 MG TABLET: 20 | 90 days supply | Qty: 90 | Fill #3

## 2016-05-04 MED FILL — ROSUVASTATIN CALCIUM 40 MG: 40 | 90 days supply | Qty: 90 | Fill #3

## 2016-05-24 MED FILL — ZOLPIDEM TARTRATE 5 MG TAB: 5 | 90 days supply | Qty: 90 | Fill #1

## 2016-06-02 MED FILL — AMLODIPINE-OLMESARTAN 5-40: 5-40 | 90 days supply | Qty: 90 | Fill #2

## 2016-07-04 MED FILL — ALLOPURINOL 300 MG TABLET: 300 | 90 days supply | Qty: 90 | Fill #3

## 2016-07-21 MED FILL — CLOPIDOGREL 75 MG TABLET: 75 | 90 days supply | Qty: 90 | Fill #2

## 2016-07-25 DIAGNOSIS — G4733 Obstructive sleep apnea (adult) (pediatric): Secondary | ICD-10-CM | POA: Diagnosis not present

## 2016-07-26 DIAGNOSIS — N183 Chronic kidney disease, stage 3 (moderate): Secondary | ICD-10-CM | POA: Diagnosis not present

## 2016-07-26 DIAGNOSIS — F5101 Primary insomnia: Secondary | ICD-10-CM | POA: Diagnosis not present

## 2016-07-26 DIAGNOSIS — G4733 Obstructive sleep apnea (adult) (pediatric): Secondary | ICD-10-CM | POA: Diagnosis not present

## 2016-07-26 DIAGNOSIS — I129 Hypertensive chronic kidney disease with stage 1 through stage 4 chronic kidney disease, or unspecified chronic kidney disease: Secondary | ICD-10-CM | POA: Diagnosis not present

## 2016-07-26 DIAGNOSIS — E669 Obesity, unspecified: Secondary | ICD-10-CM | POA: Diagnosis not present

## 2016-07-26 DIAGNOSIS — L821 Other seborrheic keratosis: Secondary | ICD-10-CM | POA: Diagnosis not present

## 2016-07-26 MED FILL — TOPIRAMATE 50 MG TABLET: 50 | 30 days supply | Qty: 30 | Fill #0

## 2016-07-28 MED FILL — CITALOPRAM HBR 20 MG TABLET: 20 | 90 days supply | Qty: 90 | Fill #0

## 2016-07-28 MED FILL — FENOFIBRATE 160 MG TABLET: 160 | 90 days supply | Qty: 90 | Fill #0

## 2016-08-02 ENCOUNTER — Ambulatory Visit: Payer: 59 | Admitting: Adult Health

## 2016-08-03 ENCOUNTER — Encounter: Payer: Self-pay | Admitting: Adult Health

## 2016-08-04 MED FILL — BELSOMRA 20 MG TABLET: 20 | 30 days supply | Qty: 30 | Fill #0

## 2016-08-10 MED FILL — ROSUVASTATIN CALCIUM 40 MG: 40 | 90 days supply | Qty: 90 | Fill #0

## 2016-08-10 MED FILL — OMEGA-3 ETHYL ESTERS 1 GM C: 1 | 90 days supply | Qty: 360 | Fill #0

## 2016-08-30 MED FILL — TOPIRAMATE 50 MG TABLET: 50 | 30 days supply | Qty: 30 | Fill #1

## 2016-08-30 MED FILL — AMLODIPINE-OLMESARTAN 5-40: 5-40 | 90 days supply | Qty: 90 | Fill #0

## 2016-09-08 MED FILL — BELSOMRA 20 MG TABLET: 20 | 30 days supply | Qty: 30 | Fill #1

## 2016-10-03 MED FILL — ALLOPURINOL 300 MG TABLET: 300 | 90 days supply | Qty: 90 | Fill #0

## 2016-10-03 MED FILL — TOPIRAMATE 50 MG TABLET: 50 | 90 days supply | Qty: 90 | Fill #0

## 2016-10-06 DIAGNOSIS — F5101 Primary insomnia: Secondary | ICD-10-CM | POA: Diagnosis not present

## 2016-10-06 DIAGNOSIS — Z6835 Body mass index (BMI) 35.0-35.9, adult: Secondary | ICD-10-CM | POA: Diagnosis not present

## 2016-10-06 DIAGNOSIS — Z23 Encounter for immunization: Secondary | ICD-10-CM | POA: Diagnosis not present

## 2016-10-06 DIAGNOSIS — E669 Obesity, unspecified: Secondary | ICD-10-CM | POA: Diagnosis not present

## 2016-10-17 MED FILL — BELSOMRA 20 MG TABLET: 20 | 30 days supply | Qty: 30 | Fill #2

## 2016-10-25 MED FILL — CLOPIDOGREL 75 MG TABLET: 75 | 90 days supply | Qty: 90 | Fill #0

## 2016-10-31 DIAGNOSIS — J22 Unspecified acute lower respiratory infection: Secondary | ICD-10-CM | POA: Diagnosis not present

## 2016-10-31 MED FILL — VENTOLIN HFA 90 MCG INHALER: 108 (90 BAS | 25 days supply | Qty: 18 | Fill #0

## 2016-10-31 MED FILL — BENZONATATE 100 MG CAPSULE: 100 | 7 days supply | Qty: 20 | Fill #0

## 2016-10-31 MED FILL — predniSONE 20 MG TABS: 20 | 5 days supply | Qty: 5 | Fill #0

## 2016-10-31 MED FILL — AZITHROMYCIN 250 MG TABLET: 250 | 5 days supply | Qty: 6 | Fill #0

## 2016-11-02 MED FILL — CITALOPRAM HBR 20 MG TABLET: 20 | 90 days supply | Qty: 90 | Fill #1

## 2016-11-02 MED FILL — FENOFIBRATE 160 MG TABLET: 160 | 90 days supply | Qty: 90 | Fill #1

## 2016-11-07 MED FILL — ROSUVASTATIN CALCIUM 40 MG: 40 | 90 days supply | Qty: 90 | Fill #1

## 2016-11-23 DIAGNOSIS — C641 Malignant neoplasm of right kidney, except renal pelvis: Secondary | ICD-10-CM | POA: Diagnosis not present

## 2016-11-24 MED FILL — AMLODIPINE-OLMESARTAN 5-40: 5-40 | 90 days supply | Qty: 90 | Fill #1

## 2016-11-24 MED FILL — OMEGA-3 ETHYL ESTERS 1 GM C: 1 | 90 days supply | Qty: 360 | Fill #1

## 2016-12-02 MED FILL — BELSOMRA 20 MG TABLET: 20 | 30 days supply | Qty: 30 | Fill #3

## 2016-12-28 ENCOUNTER — Telehealth: Payer: Self-pay | Admitting: Neurology

## 2016-12-28 NOTE — Telephone Encounter (Signed)
Patient notified to bring chip in Monday-Thursday for download and to schedule yearly fu with Jinny Blossom per Cyril Mourning (nurse).  Appointment scheduled for 02/02/17 @ 11:30am.

## 2016-12-28 NOTE — Telephone Encounter (Signed)
Patient called in reference to see if he is able to bring in his chip to have his CPAP download.  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  Please call

## 2017-01-03 DIAGNOSIS — N183 Chronic kidney disease, stage 3 (moderate): Secondary | ICD-10-CM | POA: Diagnosis not present

## 2017-01-03 DIAGNOSIS — E782 Mixed hyperlipidemia: Secondary | ICD-10-CM | POA: Diagnosis not present

## 2017-01-03 DIAGNOSIS — E669 Obesity, unspecified: Secondary | ICD-10-CM | POA: Diagnosis not present

## 2017-01-03 DIAGNOSIS — I129 Hypertensive chronic kidney disease with stage 1 through stage 4 chronic kidney disease, or unspecified chronic kidney disease: Secondary | ICD-10-CM | POA: Diagnosis not present

## 2017-01-03 DIAGNOSIS — M1A09X1 Idiopathic chronic gout, multiple sites, with tophus (tophi): Secondary | ICD-10-CM | POA: Diagnosis not present

## 2017-01-03 DIAGNOSIS — F3342 Major depressive disorder, recurrent, in full remission: Secondary | ICD-10-CM | POA: Diagnosis not present

## 2017-01-03 DIAGNOSIS — M1A09X0 Idiopathic chronic gout, multiple sites, without tophus (tophi): Secondary | ICD-10-CM | POA: Diagnosis not present

## 2017-01-03 MED FILL — TOPIRAMATE 100 MG TABLET: 100 | 90 days supply | Qty: 180 | Fill #0

## 2017-01-03 MED FILL — ALLOPURINOL 300 MG TABLET: 300 | 90 days supply | Qty: 90 | Fill #0

## 2017-01-11 MED FILL — BELSOMRA 20 MG TABLET: 20 | 30 days supply | Qty: 30 | Fill #4

## 2017-01-19 DIAGNOSIS — G4733 Obstructive sleep apnea (adult) (pediatric): Secondary | ICD-10-CM | POA: Diagnosis not present

## 2017-01-23 MED FILL — CITALOPRAM HBR 20 MG TABLET: 20 | 90 days supply | Qty: 90 | Fill #0

## 2017-01-23 MED FILL — FENOFIBRATE 160 MG TABLET: 160 | 90 days supply | Qty: 90 | Fill #0

## 2017-01-23 MED FILL — CLOPIDOGREL 75 MG TABLET: 75 | 90 days supply | Qty: 90 | Fill #1

## 2017-02-02 ENCOUNTER — Ambulatory Visit: Payer: 59 | Admitting: Adult Health

## 2017-02-06 MED FILL — ROSUVASTATIN CALCIUM 40 MG: 40 | 90 days supply | Qty: 90 | Fill #0

## 2017-02-08 MED FILL — BELSOMRA 20 MG TABLET: 20 | 30 days supply | Qty: 30 | Fill #0

## 2017-02-20 ENCOUNTER — Telehealth: Payer: Self-pay | Admitting: Adult Health

## 2017-02-20 ENCOUNTER — Encounter: Payer: Self-pay | Admitting: Adult Health

## 2017-02-20 NOTE — Telephone Encounter (Signed)
Patients wife called office in reference to patient not sleeping at night.  Patient sleeps well once in bed, but leading up to bed time the patient will stay awake until late in the evening (varies 12-3).  Patients wife states he is tired the next day.  Wife is concerned if patient may fall asleep watching TV he wont have his CPAP machine on.    Wife has noticed patient while bending over patient is becoming very dizzy and happening a lot more often. Patient takes Plavix daily.  Patient has hx of a silent stroke per wife.

## 2017-02-20 NOTE — Telephone Encounter (Signed)
Spoke to pt and he has appt tomorrow with MM/NP will discuss issues (osa cpap and aslo some dizziness).

## 2017-02-21 ENCOUNTER — Ambulatory Visit (INDEPENDENT_AMBULATORY_CARE_PROVIDER_SITE_OTHER): Payer: 59 | Admitting: Adult Health

## 2017-02-21 ENCOUNTER — Encounter: Payer: Self-pay | Admitting: Adult Health

## 2017-02-21 ENCOUNTER — Other Ambulatory Visit: Payer: Self-pay | Admitting: Urology

## 2017-02-21 ENCOUNTER — Ambulatory Visit (HOSPITAL_COMMUNITY)
Admission: RE | Admit: 2017-02-21 | Discharge: 2017-02-21 | Disposition: A | Discharge status: Home / Self Care | Payer: 59 | Source: Ambulatory Visit | Attending: Urology | Admitting: Urology

## 2017-02-21 VITALS — BP 138/71 | HR 56 | Ht 70.0 in | Wt 242.6 lb

## 2017-02-21 DIAGNOSIS — C641 Malignant neoplasm of right kidney, except renal pelvis: Secondary | ICD-10-CM | POA: Insufficient documentation

## 2017-02-21 DIAGNOSIS — R918 Other nonspecific abnormal finding of lung field: Secondary | ICD-10-CM | POA: Diagnosis not present

## 2017-02-21 DIAGNOSIS — M419 Scoliosis, unspecified: Secondary | ICD-10-CM | POA: Diagnosis not present

## 2017-02-21 DIAGNOSIS — Z905 Acquired absence of kidney: Secondary | ICD-10-CM | POA: Diagnosis not present

## 2017-02-21 DIAGNOSIS — I700 Atherosclerosis of aorta: Secondary | ICD-10-CM | POA: Diagnosis not present

## 2017-02-21 DIAGNOSIS — Z9989 Dependence on other enabling machines and devices: Secondary | ICD-10-CM

## 2017-02-21 DIAGNOSIS — G4733 Obstructive sleep apnea (adult) (pediatric): Secondary | ICD-10-CM

## 2017-02-21 DIAGNOSIS — M479 Spondylosis, unspecified: Secondary | ICD-10-CM | POA: Diagnosis not present

## 2017-02-21 NOTE — Progress Notes (Signed)
PATIENT: [name] DOB: [birth year]  REASON FOR VISIT: follow up- OSA on CPAP HISTORY FROM: patient  HISTORY OF PRESENT ILLNESS: [name] is a 61 year old male with a history of obstructive sleep apnea on CPAP. He returns today for a compliance. His download indicates that he uses machine nightly for a compliance of 100%. He uses machine greater than 4 hours 29 out of 30 days for compliance of 97%. His average usage is 6 hours and 40 minutes. His residual AHI is 10.2 on 10 cm of water with EPR 3. He does not have a significant leak. The patient reports that he does change out his equipment regularly. He has noticed more daytime sleepiness and trouble sleeping at night. His Epworth sleepiness score is 11. He does use Belsomra before bedtime. He states that after his stroke several years ago he has noticed dizziness especially with position changes. He states that yesterday he was fixing molding in his house and was bent over quite a bit. He reports that his dizziness was worse than it has ever been yesterday. However he is unsure if this was due to his activity. He states that he took his blood pressure at home and it was in normal range. He returns today for an evaluation.  HISTORY 02/02/16: [name] is a 61 year old male with a history of obstructive sleep apnea and stroke. He returns today for follow-up. The patient states that he received a memory card from his DME company however he forgot to put the card in the machine. He realized that this morning when he went to take the cart out of the machine to bring it to his appointment. The patient states that he has been using his machine nightly. He currently has the nasal mask. He states that he can try to use a chinstrap as well. The patient states that his blood pressure has been in normal range. His primary care recently check blood work for his cholesterol. The patient does not smoke cigarettes. He continues on Plavix. He denies any  strokelike symptoms. And unfortunately he was not left with any residual deficits from his previous stroke. The patient states that he will undergo carpal tunnel surgery on the left hand and will need documentation of how long he can be off Plavix. He returns today for an evaluation.   REVIEW OF SYSTEMS: Out of a complete 14 system review of symptoms, the patient complains only of the following symptoms, and all other reviewed systems are negative.  Apnea, dizziness  ALLERGIES: No Known Allergies  HOME MEDICATIONS: Outpatient Medications Prior to Visit  Medication Sig Dispense Refill  . allopurinol (ZYLOPRIM) 300 MG tablet Take 300 mg by mouth daily.    Marland Kitchen amLODipine-olmesartan (AZOR) 5-40 MG per tablet Take 1 tablet by mouth every morning.     . citalopram (CELEXA) 20 MG tablet Take 20 mg by mouth daily.    . clopidogrel (PLAVIX) 75 MG tablet TAKE 1 TABLET EVERY DAY 90 tablet 1  . colchicine (COLCRYS) 0.6 MG tablet Take 0.6 mg by mouth daily.    . fenofibrate 160 MG tablet Take 160 mg by mouth daily.    . rosuvastatin (CRESTOR) 40 MG tablet Take 40 mg by mouth every morning.    Marland Kitchen oxyCODONE (OXY IR/ROXICODONE) 5 MG immediate release tablet Take 2 tablets (10 mg total) by mouth every 4 (four) hours as needed for moderate pain or severe pain. (Patient not taking: Reported on 02/21/2017) 45 tablet 0  . zolpidem (  AMBIEN) 5 MG tablet Take 5 mg by mouth at bedtime as needed for sleep.      No facility-administered medications prior to visit.     PAST MEDICAL HISTORY: Past Medical History:  Diagnosis Date  . Arthritis   . Depression   . High cholesterol   . Hypertension   . Renal cell carcinoma   . 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 Dr Einar Gip stopped med, no seizures since  . Sleep apnea    CPAP every night  . Stroke ([place]) 11/03/2011   no deficits    PAST SURGICAL HISTORY: Past Surgical History:  Procedure Laterality Date  . ANTERIOR CRUCIATE LIGAMENT  REPAIR    . BICEPS TENDON REPAIR    . CARPAL TUNNEL RELEASE    . CARPAL TUNNEL RELEASE Left 02/26/2016   Procedure: LEFT LIMITED OPEN CARPAL TUNNEL RELEASE;  Surgeon: Roseanne Kaufman, MD;  Location: [facility];  Service: Orthopedics;  Laterality: Left;  . KNEE ARTHROSCOPY    . NEPHRECTOMY    . TOTAL KNEE ARTHROPLASTY  11/16/2012   Procedure: TOTAL KNEE ARTHROPLASTY;  Surgeon: Mcarthur Rossetti, MD;  Location[facility];  Service: Orthopedics;  Laterality: Left;  Left Total Knee Arthroplasty    FAMILY HISTORY: Family History  Problem Relation Age of Onset  . Coronary artery disease Father     SOCIAL HISTORY: Social History   Social History  . Marital status: Married    Spouse name: [name]  . Number of children: 2  . Years of education: College   Occupational History  . [employer]     Self Employed   Social History Main Topics  . Smoking status: Former Smoker    Years: 2.00    Types: Cigarettes    Quit date: 12/05/1988  . Smokeless tobacco: Never Used     Comment: QUIT SMOKING OVER 35 YEAR'S AGO  ( 1970 'S)  . Alcohol use 0.0 oz/week     Comment: socially  . Drug use: No  . Sexual activity: Yes   Other Topics Concern  . 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   social drinker, never a smoker.    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       PHYSICAL EXAM  Vitals:   02/21/17 1114  BP: 138/71  Pulse: (!) 56  Weight: 242 lb 9.6 oz (110 kg)  Height: 5\' 10"  (1.778 m)   Body mass index is 34.81 kg/m.  Generalized: Well developed, in no acute distress   Neurological examination  Mentation: Alert oriented to time, place, history taking. Follows all commands speech and language fluent Cranial nerve II-XII: Pupils were equal round reactive to light.  Extraocular movements were full, visual field were full on confrontational test. Facial sensation and strength were normal. Uvula tongue midline. Head turning and shoulder shrug  were normal and symmetric. Circumference 18.5 inches. Mallampati 3+ Motor: The motor testing reveals 5 over 5 strength of all 4 extremities. Good symmetric motor tone is noted throughout.  Sensory: Sensory testing is intact to soft touch on all 4 extremities. No evidence of extinction is noted.  Coordination: Cerebellar testing reveals good finger-nose-finger and heel-to-shin bilaterally.  Gait and station: Gait is normal.  Reflexes: Deep tendon reflexes are symmetric and normal  bilaterally.   DIAGNOSTIC DATA (LABS, IMAGING, TESTING) - I reviewed patient records, labs, notes, testing and imaging myself where available.  Lab Results  Component Value Date   WBC 8.5 11/21/2012   HGB 8.1 (L) 11/21/2012   HCT 23.6 (L) 11/21/2012   MCV 82.8 11/21/2012   PLT 211 11/21/2012      Component Value Date/Time   NA 136 11/21/2012 0350   K 4.2 11/21/2012 0350   CL 103 11/21/2012 0350   CO2 24 11/21/2012 0350   GLUCOSE 98 11/21/2012 0350   BUN 23 11/21/2012 0350   CREATININE 1.55 (H) 11/21/2012 0350   CALCIUM 8.4 11/21/2012 0350   PROT 5.9 (L) 11/20/2012 0415   ALBUMIN 2.7 (L) 11/20/2012 0415   AST 14 11/20/2012 0415   ALT 9 11/20/2012 0415   ALKPHOS 67 11/20/2012 0415   BILITOT 0.3 11/20/2012 0415   GFRNONAA 49 (L) 11/21/2012 0350   GFRAA 57 (L) 11/21/2012 0350   Lab Results  Component Value Date   CHOL 103 11/18/2012   HDL 31 (L) 11/18/2012   LDLCALC 39 11/18/2012   TRIG 166 (H) 11/18/2012   CHOLHDL 3.3 11/18/2012   Lab Results  Component Value Date   HGBA1C 5.4 11/18/2012      ASSESSMENT AND PLAN 61 y.o. year old male  has a past medical history of Arthritis; Depression; High cholesterol; Hypertension; Renal cell carcinoma; Seizures ([place]); Sleep apnea; and Stroke ([place]) (11/03/2011). here with:  1.  Obstructive sleep apnea on CPAP  The patient's download shows excellent compliance however his residual AHI is elevated. I will increase his pressure to 11 cm of water with EPR of 3. Advised that if his symptoms worsen or he develops new symptoms he should let us know. Patient advised that his increase in dizziness could be due to his activity yesterday. Advised that if his dizziness continues he should let us know. He will follow-up in 3 months or sooner if needed..   I spent 15 minutes with the patient 50% this time was spent reviewing his CPAP download.  Ward Givens, MSN, NP-C 02/21/2017, 11:38 AM [facility] [address], [place], [place] [address] [phone]

## 2017-02-21 NOTE — Progress Notes (Signed)
I agree with the assessment and plan as directed by NP .The patient is known to me .   Traveion Ruddock, MD  

## 2017-02-21 NOTE — Patient Instructions (Signed)
We will increase pressure to 11cm H20 If your symptoms worsen or you develop new symptoms please let us know.

## 2017-03-07 MED FILL — AMLODIPINE-OLMESARTAN 5-40: 5-40 | 90 days supply | Qty: 90 | Fill #2

## 2017-03-14 MED FILL — OMEGA-3 ETHYL ESTERS 1 GM C: 1 | 30 days supply | Qty: 120 | Fill #0

## 2017-03-22 MED FILL — BELSOMRA 20 MG TABLET: 20 | 30 days supply | Qty: 30 | Fill #1

## 2017-03-27 MED FILL — TOPIRAMATE 100 MG TABLET: 100 | 90 days supply | Qty: 180 | Fill #1

## 2017-03-30 DIAGNOSIS — M1A09X0 Idiopathic chronic gout, multiple sites, without tophus (tophi): Secondary | ICD-10-CM | POA: Diagnosis not present

## 2017-03-30 DIAGNOSIS — E782 Mixed hyperlipidemia: Secondary | ICD-10-CM | POA: Diagnosis not present

## 2017-04-03 MED FILL — ALLOPURINOL 300 MG TABLET: 300 | 90 days supply | Qty: 90 | Fill #1

## 2017-04-07 DIAGNOSIS — L918 Other hypertrophic disorders of the skin: Secondary | ICD-10-CM | POA: Diagnosis not present

## 2017-04-07 DIAGNOSIS — L308 Other specified dermatitis: Secondary | ICD-10-CM | POA: Diagnosis not present

## 2017-04-07 DIAGNOSIS — I129 Hypertensive chronic kidney disease with stage 1 through stage 4 chronic kidney disease, or unspecified chronic kidney disease: Secondary | ICD-10-CM | POA: Diagnosis not present

## 2017-04-07 DIAGNOSIS — Z1159 Encounter for screening for other viral diseases: Secondary | ICD-10-CM | POA: Diagnosis not present

## 2017-04-07 DIAGNOSIS — Z1211 Encounter for screening for malignant neoplasm of colon: Secondary | ICD-10-CM | POA: Diagnosis not present

## 2017-04-07 DIAGNOSIS — N183 Chronic kidney disease, stage 3 (moderate): Secondary | ICD-10-CM | POA: Diagnosis not present

## 2017-04-07 MED FILL — TRIAMCINOLONE 0.1% CREAM: 0.1 | 30 days supply | Qty: 80 | Fill #0

## 2017-04-10 MED FILL — OMEGA-3 ETHYL ESTERS 1 GM C: 1 | 30 days supply | Qty: 120 | Fill #1

## 2017-04-26 DIAGNOSIS — I10 Essential (primary) hypertension: Secondary | ICD-10-CM | POA: Diagnosis not present

## 2017-04-26 DIAGNOSIS — Z905 Acquired absence of kidney: Secondary | ICD-10-CM | POA: Diagnosis not present

## 2017-04-26 DIAGNOSIS — N183 Chronic kidney disease, stage 3 (moderate): Secondary | ICD-10-CM | POA: Diagnosis not present

## 2017-05-02 MED FILL — CLOPIDOGREL 75 MG TABLET: 75 | 90 days supply | Qty: 90 | Fill #0

## 2017-05-02 MED FILL — CITALOPRAM HBR 20 MG TABLET: 20 | 90 days supply | Qty: 90 | Fill #1

## 2017-05-02 MED FILL — FENOFIBRATE 160 MG TABLET: 160 | 90 days supply | Qty: 90 | Fill #1

## 2017-05-09 MED FILL — BELSOMRA 20 MG TABLET: 20 | 30 days supply | Qty: 30 | Fill #0

## 2017-05-15 MED FILL — OMEGA-3 ETHYL ESTERS 1 GM C: 1 | 30 days supply | Qty: 120 | Fill #2

## 2017-05-15 MED FILL — ROSUVASTATIN CALCIUM 40 MG: 40 | 90 days supply | Qty: 90 | Fill #1

## 2017-05-29 ENCOUNTER — Encounter: Payer: Self-pay | Admitting: Adult Health

## 2017-05-30 ENCOUNTER — Encounter: Payer: Self-pay | Admitting: Adult Health

## 2017-05-30 ENCOUNTER — Ambulatory Visit (INDEPENDENT_AMBULATORY_CARE_PROVIDER_SITE_OTHER): Payer: 59 | Admitting: Adult Health

## 2017-05-30 VITALS — BP 122/72 | HR 50 | Ht 70.0 in | Wt 235.8 lb

## 2017-05-30 DIAGNOSIS — Z9989 Dependence on other enabling machines and devices: Secondary | ICD-10-CM

## 2017-05-30 DIAGNOSIS — G4733 Obstructive sleep apnea (adult) (pediatric): Secondary | ICD-10-CM

## 2017-05-30 NOTE — Patient Instructions (Signed)
Continue using CPAP nightly If your symptoms worsen or you develop new symptoms please let us know.   

## 2017-05-30 NOTE — Progress Notes (Signed)
PATIENT: [name] DOB: 1956/11/21  REASON FOR VISIT: follow up- obstructive sleep apnea on CPAP HISTORY FROM: patient  HISTORY OF PRESENT ILLNESS: [name] is a 61 year old male with a history of obstructive sleep apnea on CPAP. He returns today for a compliance download. At the last visit his pressure was increased to 11 cm of water. His download indicates that he uses machine 29 out of 30 days for compliance of 97%. Each night he uses machine greater than 4 hours. His average usage is 6 hours and 43 minutes. His residual AHI is 3.1 on a pressure of 11 cm water with EPR 3. He does not have a significant leak. He reports that the increase in pressure has been beneficial. He states that he does feel more rested and not as fatigued throughout the day. His Epworth sleepiness score is 12 and fatigue severity score is 27. Overall he feels that the CPAP is working well for him. He returns today for an evaluation.   REVIEW OF SYSTEMS: Out of a complete 14 system review of symptoms, the patient complains only of the following symptoms, and all other reviewed systems are negative.  See history of present illness  ALLERGIES: No Known Allergies  HOME MEDICATIONS: Outpatient Medications Prior to Visit  Medication Sig Dispense Refill  . allopurinol (ZYLOPRIM) 300 MG tablet Take 300 mg by mouth daily.    Marland Kitchen amLODipine-olmesartan (AZOR) 5-40 MG per tablet Take 1 tablet by mouth every morning.     . citalopram (CELEXA) 20 MG tablet Take 20 mg by mouth daily.    . clopidogrel (PLAVIX) 75 MG tablet TAKE 1 TABLET EVERY DAY 90 tablet 1  . colchicine (COLCRYS) 0.6 MG tablet Take 0.6 mg by mouth daily.    . fenofibrate 160 MG tablet Take 160 mg by mouth daily.    . rosuvastatin (CRESTOR) 40 MG tablet Take 40 mg by mouth every morning.    . Suvorexant (BELSOMRA) 20 MG TABS Take by mouth. Takes prn nightly     No facility-administered medications prior to visit.     PAST MEDICAL HISTORY: Past  Medical History:  Diagnosis Date  . Arthritis   . Depression   . High cholesterol   . Hypertension   . Renal cell carcinoma   . 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 Dr Einar Gip stopped med, no seizures since  . Sleep apnea    CPAP every night  . Stroke ([place]) 11/03/2011   no deficits    PAST SURGICAL HISTORY: Past Surgical History:  Procedure Laterality Date  . ANTERIOR CRUCIATE LIGAMENT REPAIR    . BICEPS TENDON REPAIR    . CARPAL TUNNEL RELEASE    . CARPAL TUNNEL RELEASE Left 02/26/2016   Procedure: LEFT LIMITED OPEN CARPAL TUNNEL RELEASE;  Surgeon: Roseanne Kaufman, MD;  Location: [facility];  Service: Orthopedics;  Laterality: Left;  . KNEE ARTHROSCOPY    . NEPHRECTOMY    . TOTAL KNEE ARTHROPLASTY  11/16/2012   Procedure: TOTAL KNEE ARTHROPLASTY;  Surgeon: Mcarthur Rossetti, MD;  Location: [facility];  Service: Orthopedics;  Laterality: Left;  Left Total Knee Arthroplasty    FAMILY HISTORY: Family History  Problem Relation Age of Onset  . Coronary artery disease Father     SOCIAL HISTORY: Social History   Social History  . Marital status: Married    Spouse name: [name]  . Number of children: 2  . Years of education: [employer]  Occupational History  . [employer]     Self Employed   Social History Main Topics  . Smoking status: Former Smoker    Years: 2.00    Types: Cigarettes    Quit date: 12/05/1988  . Smokeless tobacco: Never Used     Comment: QUIT SMOKING OVER 35 YEAR'S AGO  ( 1970 'S)  . Alcohol use 0.0 oz/week     Comment: socially  . Drug use: No  . Sexual activity: Yes   Other Topics Concern  . 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   social drinker, never a smoker.    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        PHYSICAL EXAM  Vitals:   05/30/17 1020  BP: 122/72  Pulse: (!) 50  Weight: 235 lb 12.8 oz (107 kg)  Height: 5\' 10"  (1.778 m)   Body mass index is 33.83 kg/m.  Generalized: Well developed, in no acute distress   Neurological examination  Mentation: Alert oriented to time, place, history taking. Follows all commands speech and language fluent Cranial nerve II-XII: Pupils were equal round reactive to light. Extraocular movements were full, visual field were full on confrontational test. Facial sensation and strength were normal. Uvula tongue midline. Head turning and shoulder shrug  were normal and symmetric. Neck circumference 18-1/2 inches, Mallampati 3+ Motor: The motor testing reveals 5 over 5 strength of all 4 extremities. Good symmetric motor tone is noted throughout.  Sensory: Sensory testing is intact to soft touch on all 4 extremities. No evidence of extinction is noted.  Coordination: Cerebellar testing reveals good finger-nose-finger and heel-to-shin bilaterally.  Gait and station: Gait is normal.   DIAGNOSTIC DATA (LABS, IMAGING, TESTING) - I reviewed patient records, labs, notes, testing and imaging myself where available.  Lab Results  Component Value Date   WBC 8.5 11/21/2012   HGB 8.1 (L) 11/21/2012   HCT 23.6 (L) 11/21/2012   MCV 82.8 11/21/2012   PLT 211 11/21/2012      Component Value Date/Time   NA 136 11/21/2012 0350   K 4.2 11/21/2012 0350   CL 103 11/21/2012 0350   CO2 24 11/21/2012 0350   GLUCOSE 98 11/21/2012 0350   BUN 23 11/21/2012 0350   CREATININE 1.55 (H) 11/21/2012 0350   CALCIUM 8.4 11/21/2012 0350   PROT 5.9 (L) 11/20/2012 0415   ALBUMIN 2.7 (L) 11/20/2012 0415   AST 14 11/20/2012 0415   ALT 9 11/20/2012 0415   ALKPHOS 67 11/20/2012 0415   BILITOT 0.3 11/20/2012 0415   GFRNONAA 49 (L) 11/21/2012 0350   GFRAA 57 (L) 11/21/2012 0350   Lab Results  Component Value Date   CHOL 103 11/18/2012   HDL 31 (L) 11/18/2012   LDLCALC  39 11/18/2012   TRIG 166 (H) 11/18/2012   CHOLHDL 3.3 11/18/2012   Lab Results  Component Value Date   HGBA1C 5.4 11/18/2012   No results found for: [id number] Lab Results  Component Value Date   TSH 3.714 11/20/2012      ASSESSMENT AND PLAN 61 y.o. year old male  has a past medical history of Arthritis; Depression; High cholesterol; Hypertension; Renal cell carcinoma; Seizures ([place]); Sleep apnea; and Stroke ([place]) (11/03/2011). here with:  1. Obstructive sleep apnea on CPAP  The patient's download shows  excellent compliance with good treatment of his apnea. He is encouraged to continue using the CPAP nightly. He is advised that if his symptoms worsen or he develops new symptoms he should let us know. He will follow-up in one year or sooner if needed.  I spent 15 minutes with the patient. 50% of this time was spent reviewing his download.      Ward Givens, MSN, NP-C 05/30/2017, 10:41 AM [facility] [address] [place], [place] [address] [phone]

## 2017-05-31 NOTE — Progress Notes (Signed)
I agree with the assessment and plan as directed by NP .The patient is known to me .   Amoni Scallan, MD  

## 2017-06-01 DIAGNOSIS — F5101 Primary insomnia: Secondary | ICD-10-CM | POA: Diagnosis not present

## 2017-06-06 DIAGNOSIS — Z905 Acquired absence of kidney: Secondary | ICD-10-CM | POA: Diagnosis not present

## 2017-06-06 DIAGNOSIS — N183 Chronic kidney disease, stage 3 (moderate): Secondary | ICD-10-CM | POA: Diagnosis not present

## 2017-06-06 DIAGNOSIS — I10 Essential (primary) hypertension: Secondary | ICD-10-CM | POA: Diagnosis not present

## 2017-06-06 DIAGNOSIS — Z6833 Body mass index (BMI) 33.0-33.9, adult: Secondary | ICD-10-CM | POA: Diagnosis not present

## 2017-06-06 MED FILL — AMLODIPINE-OLMESARTAN 5-40: 5-40 | 90 days supply | Qty: 90 | Fill #0

## 2017-06-06 MED FILL — BELSOMRA 20 MG TABLET: 20 | 30 days supply | Qty: 30 | Fill #0

## 2017-06-15 MED FILL — OMEGA-3 ETHYL ESTERS 1 GM C: 1 | 30 days supply | Qty: 120 | Fill #3

## 2017-06-22 ENCOUNTER — Encounter: Payer: Self-pay | Admitting: Family Medicine

## 2017-06-22 MED FILL — TOPIRAMATE 100 MG TABLET: 100 | 90 days supply | Qty: 180 | Fill #0

## 2017-07-03 MED FILL — ALLOPURINOL 300 MG TABLET: 300 | 90 days supply | Qty: 90 | Fill #2

## 2017-07-10 MED FILL — BELSOMRA 20 MG TABLET: 20 | 30 days supply | Qty: 30 | Fill #1

## 2017-07-25 MED FILL — CITALOPRAM HBR 20 MG TABLET: 20 | 90 days supply | Qty: 90 | Fill #2

## 2017-07-25 MED FILL — OMEGA-3 ETHYL ESTERS 1 GM C: 1 | 30 days supply | Qty: 120 | Fill #4

## 2017-07-25 MED FILL — CLOPIDOGREL 75 MG TABLET: 75 | 90 days supply | Qty: 90 | Fill #1

## 2017-07-25 MED FILL — FENOFIBRATE 160 MG TABLET: 160 | 90 days supply | Qty: 90 | Fill #2

## 2017-08-08 MED FILL — BELSOMRA 20 MG TABLET: 20 | 30 days supply | Qty: 30 | Fill #2

## 2017-08-08 MED FILL — ROSUVASTATIN CALCIUM 40 MG: 40 | 90 days supply | Qty: 90 | Fill #2

## 2017-08-16 LAB — WBC AUTO DIFF (OUTSIDE ORGANIZATION)
Basophils % Auto: 1 % (ref 0–1)
Eosinophils % Auto: 2 % (ref 0–7)
Immature Granulocytes % Auto: 0 % (ref 0–1)
Lymphocytes % Auto: 33 % (ref 15–47)
Monocytes % Auto: 7 % (ref 5–13)
Neutrophils % Auto: 57 % (ref 42–76)
Neutrophils, Automated Count: 5.8 10*3/uL (ref 1.8–7.9)

## 2017-08-16 LAB — CBC NO DIFFERENTIAL
Hematocrit: 45.1 % (ref 39.0–51.0)
Hemoglobin: 14 g/dL (ref 13.0–17.0)
MCV: 75 fL — ABNORMAL LOW (ref 80–100)
Platelets count: 168 10*3/uL (ref 140–400)
RBC's, nucleated: 0 /100{WBCs} (ref <=0)
RDW, RBC: 17.8 % — ABNORMAL HIGH (ref 12.0–16.5)
Red blood cells count: 6.01 M/uL — ABNORMAL HIGH (ref 4.10–5.70)
WBC Count: 10.2 10*3/uL (ref 3.7–11.1)

## 2017-08-17 LAB — CHOLESTEROL: Cholesterol: 188 mg/dL (ref <=239)

## 2017-08-17 LAB — ALANINE TRANSFERASE (ALT): Alanine Transferase (ALT): 15 U/L (ref 0–47)

## 2017-08-17 LAB — CREATININE/E-GFR
Creatinine Blood: 1.34 mg/dL (ref <=1.34)
Glomerular Filtration Rate - African American: >60 mL/min (ref >=60)
Glomerular filtration rate, nonAfrican American: 57 mL/min — ABNORMAL LOW (ref >=60)

## 2017-08-17 LAB — SODIUM: Sodium: 144 meq/L (ref 135–145)

## 2017-08-17 LAB — HDL CHOLESTEROL: HDL Cholesterol: 35 mg/dL — ABNORMAL LOW (ref >=40)

## 2017-08-17 LAB — THYROID STIMULATING HORMONE: Thyroid Stimulating Hormone: 1.41 u[IU]/mL (ref 0.40–4.50)

## 2017-08-17 LAB — POTASSIUM: Potassium: 4 meq/L (ref 3.5–5.3)

## 2017-08-17 LAB — LDL CHOLESTEROL (DIRECT): Low Density Lipoproteins: 124 mg/dL (ref <=159)

## 2017-08-17 LAB — GLUCOSE: Glucose: 91 mg/dL (ref 60–159)

## 2017-08-17 LAB — CALCIUM: Calcium: 9.1 mg/dL (ref 8.8–10.5)

## 2017-08-21 MED FILL — OMEGA-3 ETHYL ESTERS 1 GM C: 1 | 30 days supply | Qty: 120 | Fill #5

## 2017-09-04 MED FILL — AMLODIPINE-OLMESARTAN 5-40: 5-40 | 90 days supply | Qty: 90 | Fill #1

## 2017-09-12 MED FILL — BELSOMRA 20 MG TABLET: 20 | 30 days supply | Qty: 30 | Fill #3

## 2017-09-28 MED FILL — OMEGA-3 ETHYL ESTERS 1 GM C: 1 | 30 days supply | Qty: 120 | Fill #0

## 2017-09-28 MED FILL — TOPIRAMATE 100 MG TABLET: 100 | 90 days supply | Qty: 180 | Fill #1

## 2017-09-28 MED FILL — ALLOPURINOL 300 MG TABLET: 300 | 90 days supply | Qty: 90 | Fill #3

## 2017-10-18 MED FILL — BELSOMRA 20 MG TABLET: 20 | 30 days supply | Qty: 30 | Fill #4

## 2017-10-30 DIAGNOSIS — G4733 Obstructive sleep apnea (adult) (pediatric): Secondary | ICD-10-CM | POA: Diagnosis not present

## 2017-10-30 MED FILL — CITALOPRAM HBR 20 MG TABLET: 20 | 90 days supply | Qty: 90 | Fill #3

## 2017-10-30 MED FILL — OMEGA-3 ETHYL ESTERS 1 GM C: 1 | 30 days supply | Qty: 120 | Fill #1

## 2017-10-30 MED FILL — CLOPIDOGREL 75 MG TABLET: 75 | 90 days supply | Qty: 90 | Fill #2

## 2017-10-30 MED FILL — ROSUVASTATIN CALCIUM 40 MG: 40 | 90 days supply | Qty: 90 | Fill #3

## 2017-10-30 MED FILL — FENOFIBRATE 160 MG TABLET: 160 | 90 days supply | Qty: 90 | Fill #3

## 2017-11-30 MED FILL — BELSOMRA 20 MG TABLET: 20 | 30 days supply | Qty: 30 | Fill #5

## 2017-12-11 MED FILL — OMEGA-3 ETHYL ESTERS 1 GM C: 1 | 30 days supply | Qty: 120 | Fill #2

## 2017-12-11 MED FILL — AMLODIPINE-OLMESARTAN 5-40: 5-40 | 90 days supply | Qty: 90 | Fill #2

## 2017-12-27 DIAGNOSIS — I872 Venous insufficiency (chronic) (peripheral): Secondary | ICD-10-CM | POA: Diagnosis not present

## 2017-12-27 MED FILL — TRIAMCINOLONE 0.1% OINTMENT: 0.1 | 15 days supply | Qty: 80 | Fill #0

## 2017-12-27 MED FILL — CEPHALEXIN 500 MG CAPSULE: 500 | 10 days supply | Qty: 30 | Fill #0

## 2017-12-27 MED FILL — predniSONE 20 MG TABS: 20 | 5 days supply | Qty: 10 | Fill #0

## 2018-01-04 DIAGNOSIS — M1A09X0 Idiopathic chronic gout, multiple sites, without tophus (tophi): Secondary | ICD-10-CM | POA: Diagnosis not present

## 2018-01-04 DIAGNOSIS — Z6834 Body mass index (BMI) 34.0-34.9, adult: Secondary | ICD-10-CM | POA: Diagnosis not present

## 2018-01-04 DIAGNOSIS — F3342 Major depressive disorder, recurrent, in full remission: Secondary | ICD-10-CM | POA: Diagnosis not present

## 2018-01-04 DIAGNOSIS — N183 Chronic kidney disease, stage 3 (moderate): Secondary | ICD-10-CM | POA: Diagnosis not present

## 2018-01-04 DIAGNOSIS — I129 Hypertensive chronic kidney disease with stage 1 through stage 4 chronic kidney disease, or unspecified chronic kidney disease: Secondary | ICD-10-CM | POA: Diagnosis not present

## 2018-01-04 DIAGNOSIS — F5101 Primary insomnia: Secondary | ICD-10-CM | POA: Diagnosis not present

## 2018-01-04 DIAGNOSIS — E782 Mixed hyperlipidemia: Secondary | ICD-10-CM | POA: Diagnosis not present

## 2018-01-04 DIAGNOSIS — E669 Obesity, unspecified: Secondary | ICD-10-CM | POA: Diagnosis not present

## 2018-01-04 DIAGNOSIS — Z23 Encounter for immunization: Secondary | ICD-10-CM | POA: Diagnosis not present

## 2018-01-04 MED FILL — ALLOPURINOL 300 MG TABLET: 300 | 90 days supply | Qty: 90 | Fill #0

## 2018-01-12 MED FILL — BELSOMRA 20 MG TABLET: 20 | 30 days supply | Qty: 30 | Fill #0

## 2018-01-12 MED FILL — OMEGA-3 ETHYL ESTERS 1 GM C: 1 | 90 days supply | Qty: 360 | Fill #0

## 2018-01-16 ENCOUNTER — Other Ambulatory Visit (INDEPENDENT_AMBULATORY_CARE_PROVIDER_SITE_OTHER): Payer: Self-pay | Admitting: Physician Assistant

## 2018-01-16 MED ORDER — DOXYCYCLINE HYCLATE 100 MG PO TABS: 100.0000 mg | ORAL_TABLET | Freq: Two times a day (BID) | ORAL | 0 refills | Status: AC

## 2018-01-16 MED FILL — DOXYCYCLINE HYCLATE 100 MG: 100 | 14 days supply | Qty: 28 | Fill #0

## 2018-01-26 MED FILL — CITALOPRAM HBR 20 MG TABLET: 20 | 90 days supply | Qty: 90 | Fill #0

## 2018-01-26 MED FILL — CLOPIDOGREL 75 MG TABLET: 75 | 90 days supply | Qty: 90 | Fill #0

## 2018-01-26 MED FILL — FENOFIBRATE 160 MG TABLET: 160 | 90 days supply | Qty: 90 | Fill #0

## 2018-02-06 MED FILL — ROSUVASTATIN CALCIUM 40 MG: 40 | 90 days supply | Qty: 90 | Fill #0

## 2018-02-13 DIAGNOSIS — Z6833 Body mass index (BMI) 33.0-33.9, adult: Secondary | ICD-10-CM | POA: Diagnosis not present

## 2018-02-13 DIAGNOSIS — N183 Chronic kidney disease, stage 3 (moderate): Secondary | ICD-10-CM | POA: Diagnosis not present

## 2018-02-13 DIAGNOSIS — I10 Essential (primary) hypertension: Secondary | ICD-10-CM | POA: Diagnosis not present

## 2018-02-13 DIAGNOSIS — Z905 Acquired absence of kidney: Secondary | ICD-10-CM | POA: Diagnosis not present

## 2018-02-26 MED FILL — BELSOMRA 20 MG TABLET: 20 | 30 days supply | Qty: 30 | Fill #1

## 2018-03-06 MED FILL — AMLODIPINE-OLMESARTAN 5-40: 5-40 | 90 days supply | Qty: 90 | Fill #0

## 2018-03-26 ENCOUNTER — Other Ambulatory Visit: Payer: Self-pay | Admitting: Orthopedic Surgery

## 2018-03-26 MED FILL — DOXYCYCLINE HYCLATE 100 MG: 100 | 7 days supply | Qty: 14 | Fill #0

## 2018-03-26 MED FILL — BENZONATATE 200 MG CAPSULE: 200 | 7 days supply | Qty: 20 | Fill #0

## 2018-04-02 MED FILL — ALLOPURINOL 300 MG TABLET: 300 | 90 days supply | Qty: 90 | Fill #1

## 2018-04-02 MED FILL — BELSOMRA 20 MG TABLET: 20 | 30 days supply | Qty: 30 | Fill #2

## 2018-05-01 MED FILL — FENOFIBRATE 160 MG TABLET: 160 | 90 days supply | Qty: 90 | Fill #1

## 2018-05-01 MED FILL — CITALOPRAM HBR 20 MG TABLET: 20 | 90 days supply | Qty: 90 | Fill #1

## 2018-05-01 MED FILL — OMEGA-3 ETHYL ESTERS 1 GM C: 1 | 90 days supply | Qty: 360 | Fill #1

## 2018-05-01 MED FILL — BELSOMRA 20 MG TABLET: 20 | 30 days supply | Qty: 30 | Fill #3

## 2018-05-05 DIAGNOSIS — J209 Acute bronchitis, unspecified: Secondary | ICD-10-CM | POA: Diagnosis not present

## 2018-05-14 MED FILL — ROSUVASTATIN CALCIUM 40 MG: 40 | 90 days supply | Qty: 90 | Fill #1

## 2018-05-31 ENCOUNTER — Ambulatory Visit: Payer: 59 | Admitting: Adult Health

## 2018-06-04 MED FILL — CLOPIDOGREL 75 MG TABLET: 75 | 90 days supply | Qty: 90 | Fill #1

## 2018-06-05 ENCOUNTER — Ambulatory Visit: Payer: 59 | Admitting: Adult Health

## 2018-06-05 ENCOUNTER — Encounter: Payer: Self-pay | Admitting: Adult Health

## 2018-06-05 VITALS — BP 127/75 | HR 66 | Resp 20 | Ht 70.0 in | Wt 243.0 lb

## 2018-06-05 DIAGNOSIS — G4733 Obstructive sleep apnea (adult) (pediatric): Secondary | ICD-10-CM | POA: Diagnosis not present

## 2018-06-05 DIAGNOSIS — Z9989 Dependence on other enabling machines and devices: Secondary | ICD-10-CM

## 2018-06-05 NOTE — Progress Notes (Signed)
PATIENT: [name] DOB: Oct 25, 1956  REASON FOR VISIT: follow up HISTORY FROM: patient  HISTORY OF PRESENT ILLNESS: Today 06/05/18: [name] is a 62 year old male with a history of obstructive sleep apnea on CPAP.  He returns today for follow-up.  We are unable to obtain a wireless download as his machine is dated.  He forgot to bring his memory card with him today.  He reports that he does use the machine nightly.  He denies having any problems with the machine.  He reports that the new pressure seems to work well for him.  He would like a new machine as his machine is 61 years old.  He returns today for evaluation.   HISTORY :  [name] is a 62 year old male with a history of obstructive sleep apnea on CPAP. He returns today for a compliance download. At the last visit his pressure was increased to 11 cm of water. His download indicates that he uses machine 29 out of 30 days for compliance of 97%. Each night he uses machine greater than 4 hours. His average usage is 6 hours and 43 minutes. His residual AHI is 3.1 on a pressure of 11 cm water with EPR 3. He does not have a significant leak. He reports that the increase in pressure has been beneficial. He states that he does feel more rested and not as fatigued throughout the day. His Epworth sleepiness score is 12 and fatigue severity score is 27. Overall he feels that the CPAP is working well for him. He returns today for an evaluation.   REVIEW OF SYSTEMS: Out of a complete 14 system review of symptoms, the patient complains only of the following symptoms, and all other reviewed systems are negative.  See HPI ALLERGIES: No Known Allergies  HOME MEDICATIONS: Outpatient Medications Prior to Visit  Medication Sig Dispense Refill  . allopurinol (ZYLOPRIM) 300 MG tablet Take 300 mg by mouth daily.    Marland Kitchen amLODipine-olmesartan (AZOR) 5-40 MG per tablet Take 1 tablet by mouth every morning.     . citalopram (CELEXA) 20 MG tablet  Take 20 mg by mouth daily.    . clopidogrel (PLAVIX) 75 MG tablet TAKE 1 TABLET EVERY DAY 90 tablet 1  . colchicine (COLCRYS) 0.6 MG tablet Take 0.6 mg by mouth daily.    . fenofibrate 160 MG tablet Take 160 mg by mouth daily.    . rosuvastatin (CRESTOR) 40 MG tablet Take by mouth.    . Suvorexant 20 MG TABS Take by mouth.    . rosuvastatin (CRESTOR) 40 MG tablet Take 40 mg by mouth every morning.    . Suvorexant (BELSOMRA) 20 MG TABS Take by mouth. Takes prn nightly     No facility-administered medications prior to visit.     PAST MEDICAL HISTORY: Past Medical History:  Diagnosis Date  . Arthritis   . Depression   . High cholesterol   . Hypertension   . Renal cell carcinoma   . 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 Dr [name] stopped med, no seizures since  . Sleep apnea    CPAP every night  . Stroke ([name]) 11/03/2011   no deficits    PAST SURGICAL HISTORY: Past Surgical History:  Procedure Laterality Date  . ANTERIOR CRUCIATE LIGAMENT REPAIR    . BICEPS TENDON REPAIR    . CARPAL TUNNEL RELEASE    . CARPAL TUNNEL RELEASE Left 02/26/2016   Procedure: LEFT LIMITED OPEN CARPAL  TUNNEL RELEASE;  Surgeon: Roseanne Kaufman, MD;  Location: [name];  Service: Orthopedics;  Laterality: Left;  . KNEE ARTHROSCOPY    . NEPHRECTOMY    . TOTAL KNEE ARTHROPLASTY  11/16/2012   Procedure: TOTAL KNEE ARTHROPLASTY;  Surgeon: Mcarthur Rossetti, MD;  [facility];  Service: Orthopedics;  Laterality: Left;  Left Total Knee Arthroplasty    FAMILY HISTORY: Family History  Problem Relation Age of Onset  . Coronary artery disease Father     SOCIAL HISTORY: Social History   Socioeconomic History  . Marital status: Married    Spouse name: [name]  . Number of children: 2  . Years of education: College  . Highest education level: Not on file  Occupational History  . Occupation: Psychologist, sport and exercise    Employer: [name]     Comment: Self  Employed  Social Needs  . Financial resource strain: Not on file  . Food insecurity:    Worry: Not on file    Inability: Not on file  . Transportation needs:    Medical: Not on file    Non-medical: Not on file  Tobacco Use  . Smoking status: Former Smoker    Years: 2.00    Types: Cigarettes    Last attempt to quit: 12/05/1988    Years since quitting: 29.5  . Smokeless tobacco: Never Used  . Tobacco comment: QUIT SMOKING OVER 35 YEAR'S AGO  ( 1970 'S)  Substance and Sexual Activity  . Alcohol use: Yes    Comment: socially  . Drug use: No  . Sexual activity: Yes  Lifestyle  . Physical activity:    Days per week: Not on file    Minutes per session: Not on file  . Stress: Not on file  Relationships  . Social connections:    Talks on phone: Not on file    Gets together: Not on file    Attends religious service: Not on file    Active member of club or organization: Not on file    Attends meetings of clubs or organizations: Not on file    Relationship status: Not on file  . Intimate partner violence:    Fear of current or ex partner: Not on file    Emotionally abused: Not on file    Physically abused: Not on file    Forced sexual activity: Not on file  Other Topics Concern  . 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   social drinker, never a smoker.    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       PHYSICAL EXAM  There were no vitals filed for this visit. There is no height or weight on file to calculate BMI.  Generalized: Well developed, in no acute distress   Neurological examination  Mentation: Alert oriented to time, place, history taking. Follows all commands speech and language fluent Cranial nerve II-XII: Pupils were equal round reactive to light. Extraocular movements were full,  visual field were full on confrontational test. Facial sensation and strength were normal. Uvula tongue midline. Head turning and shoulder shrug  were normal and symmetric. Motor: The motor testing reveals 5 over 5 strength of all 4 extremities. Good symmetric motor tone is noted throughout.  Sensory: Sensory testing is intact to soft touch on all 4 extremities.  No evidence of extinction is noted.  Coordination: Cerebellar testing reveals good finger-nose-finger and heel-to-shin bilaterally.  Gait and station: Gait is normal.  Reflexes: Deep tendon reflexes are symmetric and normal bilaterally.   DIAGNOSTIC DATA (LABS, IMAGING, TESTING) - I reviewed patient records, labs, notes, testing and imaging myself where available.  Lab Results  Component Value Date   WBC 8.5 11/21/2012   HGB 8.1 (L) 11/21/2012   HCT 23.6 (L) 11/21/2012   MCV 82.8 11/21/2012   PLT 211 11/21/2012      Component Value Date/Time   NA 136 11/21/2012 0350   K 4.2 11/21/2012 0350   CL 103 11/21/2012 0350   CO2 24 11/21/2012 0350   GLUCOSE 98 11/21/2012 0350   BUN 23 11/21/2012 0350   CREATININE 1.55 (H) 11/21/2012 0350   CALCIUM 8.4 11/21/2012 0350   PROT 5.9 (L) 11/20/2012 0415   ALBUMIN 2.7 (L) 11/20/2012 0415   AST 14 11/20/2012 0415   ALT 9 11/20/2012 0415   ALKPHOS 67 11/20/2012 0415   BILITOT 0.3 11/20/2012 0415   GFRNONAA 49 (L) 11/21/2012 0350   GFRAA 57 (L) 11/21/2012 0350   Lab Results  Component Value Date   CHOL 103 11/18/2012   HDL 31 (L) 11/18/2012   LDLCALC 39 11/18/2012   TRIG 166 (H) 11/18/2012   CHOLHDL 3.3 11/18/2012   Lab Results  Component Value Date   HGBA1C 5.4 11/18/2012   No results found for: VITAMINB12 Lab Results  Component Value Date   TSH 3.714 11/20/2012      ASSESSMENT AND PLAN 62 y.o. year old male  has a past medical history of Arthritis, Depression, High cholesterol, Hypertension, Renal cell carcinoma, Seizures ([place]), Sleep apnea, and Stroke ([place])  (11/03/2011). here with :  1.  Obstructive sleep apnea  We were unable to view a download today.  The patient will bring his card by the office for download.  I have encouraged the patient to continue using the CPAP nightly.  I will write for a new CPAP machine.  He is advised that if his symptoms worsen or he develops new symptoms he should let us know.  He will follow-up in 6 months or sooner if needed.  I spent 15 minutes with the patient. 50% of this time was spent discussing his CPAP   Ward Givens, MSN, NP-C 06/05/2018, 1:51 PM [facility] [address] [place], [place] [address] [phone]

## 2018-06-05 NOTE — Patient Instructions (Addendum)
Your Plan:  Continue using CPAP nightly and greater than 4 hours each night If your symptoms worsen or you develop new symptoms please let us know.   Please call [facility] at [phone], extension [phone] to discuss your concerns. Their customer service representatives will be glad to assist you. If they do not answer, please leave a message and they will call you back. Make sure to leave your name and return phone number. If you do not get a response from them in 3 business days, please let our office know.   Thank you for coming to see us at [facility]. I hope we have been able to provide you high quality care today.  You may receive a patient satisfaction survey over the next few weeks. We would appreciate your feedback and comments so that we may continue to improve ourselves and the health of our patients.  

## 2018-06-06 ENCOUNTER — Telehealth: Payer: Self-pay | Admitting: Adult Health

## 2018-06-06 ENCOUNTER — Other Ambulatory Visit: Payer: Self-pay | Admitting: Neurology

## 2018-06-06 DIAGNOSIS — Z9989 Dependence on other enabling machines and devices: Principal | ICD-10-CM

## 2018-06-06 DIAGNOSIS — G4733 Obstructive sleep apnea (adult) (pediatric): Secondary | ICD-10-CM

## 2018-06-06 NOTE — Telephone Encounter (Signed)
Order reentered for the pt and sent to Angie with [facility] to forward to be completed for the patient.

## 2018-06-06 NOTE — Telephone Encounter (Signed)
[name] [phone] called he rec'd new script for CPAP but the pressure setting was not indicated. He said from his hx pressure has been set at 11. Please refax to [phone]

## 2018-06-17 ENCOUNTER — Encounter: Payer: Self-pay | Admitting: Adult Health

## 2018-06-18 DIAGNOSIS — G4733 Obstructive sleep apnea (adult) (pediatric): Secondary | ICD-10-CM | POA: Diagnosis not present

## 2018-06-18 DIAGNOSIS — Z96659 Presence of unspecified artificial knee joint: Secondary | ICD-10-CM | POA: Diagnosis not present

## 2018-06-18 MED FILL — BELSOMRA 20 MG TABLET: 20 | 30 days supply | Qty: 30 | Fill #4

## 2018-06-18 MED FILL — AMLODIPINE-OLMESARTAN 5-40: 5-40 | 90 days supply | Qty: 90 | Fill #1

## 2018-06-26 ENCOUNTER — Telehealth: Payer: Self-pay | Admitting: Adult Health

## 2018-06-26 DIAGNOSIS — G4733 Obstructive sleep apnea (adult) (pediatric): Secondary | ICD-10-CM

## 2018-06-26 DIAGNOSIS — Z9989 Dependence on other enabling machines and devices: Principal | ICD-10-CM

## 2018-06-26 NOTE — Telephone Encounter (Signed)
I received the patient CPAP download from of the encounter.  His download indicates that he uses machine 30 out of 30 days for compliance of 100%.  He uses machine greater than 4 hours 29 out of 30 days for compliance of 97%.  On average he uses his machine 6 hours and 41 minutes.  His residual AHI is 20.2 on 11 cm of water with EPR of 3.  He does not have a significant leak.  The patient CPAP download shows a high residual apnea.  For that reason I am going to change his pressure to AutoSet 5 to 15 cm of water.

## 2018-06-27 NOTE — Telephone Encounter (Signed)
Spoke with patient and gave him his current CPAP download report. Advised him that Jinny Blossom is changing him to autoset 5-15 cm. He stated he is supposed to be set at 9 cm. This RN advised that because of his high residual apnea she is changing to autoset.  He stated he has received a new machine but was unable to tolerate the full mask. He has gone back to previous mask.  Advised if he has issues with CPAP to call [facility] to be sure the order for autoset was carried out. He verbalized understanding, appreciation. Community message sent via Epic to pull CPAP order.

## 2018-07-03 DIAGNOSIS — E782 Mixed hyperlipidemia: Secondary | ICD-10-CM | POA: Diagnosis not present

## 2018-07-03 DIAGNOSIS — I129 Hypertensive chronic kidney disease with stage 1 through stage 4 chronic kidney disease, or unspecified chronic kidney disease: Secondary | ICD-10-CM | POA: Diagnosis not present

## 2018-07-03 DIAGNOSIS — M1A09X0 Idiopathic chronic gout, multiple sites, without tophus (tophi): Secondary | ICD-10-CM | POA: Diagnosis not present

## 2018-07-03 DIAGNOSIS — N183 Chronic kidney disease, stage 3 (moderate): Secondary | ICD-10-CM | POA: Diagnosis not present

## 2018-07-05 DIAGNOSIS — J301 Allergic rhinitis due to pollen: Secondary | ICD-10-CM | POA: Diagnosis not present

## 2018-07-05 DIAGNOSIS — Z1389 Encounter for screening for other disorder: Secondary | ICD-10-CM | POA: Diagnosis not present

## 2018-07-05 DIAGNOSIS — I129 Hypertensive chronic kidney disease with stage 1 through stage 4 chronic kidney disease, or unspecified chronic kidney disease: Secondary | ICD-10-CM | POA: Diagnosis not present

## 2018-07-05 DIAGNOSIS — Z0000 Encounter for general adult medical examination without abnormal findings: Secondary | ICD-10-CM | POA: Diagnosis not present

## 2018-07-05 DIAGNOSIS — F5101 Primary insomnia: Secondary | ICD-10-CM | POA: Diagnosis not present

## 2018-07-05 DIAGNOSIS — Z1211 Encounter for screening for malignant neoplasm of colon: Secondary | ICD-10-CM | POA: Diagnosis not present

## 2018-07-05 DIAGNOSIS — F3342 Major depressive disorder, recurrent, in full remission: Secondary | ICD-10-CM | POA: Diagnosis not present

## 2018-07-05 DIAGNOSIS — N183 Chronic kidney disease, stage 3 (moderate): Secondary | ICD-10-CM | POA: Diagnosis not present

## 2018-07-05 MED FILL — ESCITALOPRAM 20 MG TABLET: 20 | 90 days supply | Qty: 90 | Fill #0

## 2018-07-06 NOTE — Progress Notes (Signed)
I agree with the assessment and plan as directed by NP .The patient is known to me .   Janely Gullickson, MD  

## 2018-07-09 MED FILL — ALLOPURINOL 300 MG TABLET: 300 | 90 days supply | Qty: 90 | Fill #2

## 2018-07-17 MED FILL — BELSOMRA 20 MG TABLET: 20 | 30 days supply | Qty: 30 | Fill #0

## 2018-07-19 DIAGNOSIS — Z96659 Presence of unspecified artificial knee joint: Secondary | ICD-10-CM | POA: Diagnosis not present

## 2018-07-19 DIAGNOSIS — G4733 Obstructive sleep apnea (adult) (pediatric): Secondary | ICD-10-CM | POA: Diagnosis not present

## 2018-07-25 ENCOUNTER — Telehealth: Payer: Self-pay | Admitting: Neurology

## 2018-07-26 NOTE — Telephone Encounter (Signed)
error 

## 2018-08-02 MED FILL — CITALOPRAM HBR 20 MG TABLET: 20 | 90 days supply | Qty: 90 | Fill #2

## 2018-08-02 MED FILL — OMEGA-3 ETHYL ESTER 1 GM CA: 1 | 90 days supply | Qty: 360 | Fill #2

## 2018-08-02 MED FILL — FENOFIBRATE 160 MG TABLET: 160 | 90 days supply | Qty: 90 | Fill #2

## 2018-08-07 MED FILL — ROSUVASTATIN CALCIUM 40 MG: 40 | 90 days supply | Qty: 90 | Fill #2

## 2018-08-14 DIAGNOSIS — Z8673 Personal history of transient ischemic attack (TIA), and cerebral infarction without residual deficits: Secondary | ICD-10-CM | POA: Diagnosis not present

## 2018-08-14 DIAGNOSIS — Z1211 Encounter for screening for malignant neoplasm of colon: Secondary | ICD-10-CM | POA: Diagnosis not present

## 2018-08-14 MED FILL — PEG-3350 SOLUTION: 420 | 1 days supply | Qty: 4000 | Fill #0

## 2018-08-15 ENCOUNTER — Telehealth: Payer: Self-pay

## 2018-08-15 NOTE — Telephone Encounter (Signed)
Clearance form fax to [facility] to [phone]. Fax confirmed and receive.

## 2018-08-19 DIAGNOSIS — G4733 Obstructive sleep apnea (adult) (pediatric): Secondary | ICD-10-CM | POA: Diagnosis not present

## 2018-08-19 DIAGNOSIS — Z96659 Presence of unspecified artificial knee joint: Secondary | ICD-10-CM | POA: Diagnosis not present

## 2018-08-20 DIAGNOSIS — N183 Chronic kidney disease, stage 3 (moderate): Secondary | ICD-10-CM | POA: Diagnosis not present

## 2018-08-20 DIAGNOSIS — Z1159 Encounter for screening for other viral diseases: Secondary | ICD-10-CM | POA: Diagnosis not present

## 2018-08-20 DIAGNOSIS — F3342 Major depressive disorder, recurrent, in full remission: Secondary | ICD-10-CM | POA: Diagnosis not present

## 2018-08-20 DIAGNOSIS — I129 Hypertensive chronic kidney disease with stage 1 through stage 4 chronic kidney disease, or unspecified chronic kidney disease: Secondary | ICD-10-CM | POA: Diagnosis not present

## 2018-08-20 DIAGNOSIS — B360 Pityriasis versicolor: Secondary | ICD-10-CM | POA: Diagnosis not present

## 2018-08-20 DIAGNOSIS — Z23 Encounter for immunization: Secondary | ICD-10-CM | POA: Diagnosis not present

## 2018-08-20 MED FILL — VENLAFAXINE HCL ER 150 MG C: 150 | 30 days supply | Qty: 30 | Fill #0

## 2018-08-20 MED FILL — TERBINAFINE HCL 250 MG TAB: 250 | 14 days supply | Qty: 14 | Fill #0

## 2018-08-20 MED FILL — BETAMETHASONE DP 0.05% CRM: 0.05 | 30 days supply | Qty: 45 | Fill #0

## 2018-08-20 MED FILL — TRIAMCINOLONE 0.1% CREAM: 0.1 | 30 days supply | Qty: 80 | Fill #0

## 2018-08-27 MED FILL — BELSOMRA 20 MG TABLET: 20 | 30 days supply | Qty: 30 | Fill #1

## 2018-09-04 MED FILL — AMLODIPINE-OLMESARTAN 5-40: 5-40 | 90 days supply | Qty: 90 | Fill #2

## 2018-09-04 MED FILL — CLOPIDOGREL 75 MG TABLET: 75 | 90 days supply | Qty: 90 | Fill #2

## 2018-09-05 DIAGNOSIS — Z1211 Encounter for screening for malignant neoplasm of colon: Secondary | ICD-10-CM | POA: Diagnosis not present

## 2018-09-05 DIAGNOSIS — K5730 Diverticulosis of large intestine without perforation or abscess without bleeding: Secondary | ICD-10-CM | POA: Diagnosis not present

## 2018-09-05 DIAGNOSIS — D123 Benign neoplasm of transverse colon: Secondary | ICD-10-CM | POA: Diagnosis not present

## 2018-09-05 DIAGNOSIS — D122 Benign neoplasm of ascending colon: Secondary | ICD-10-CM | POA: Diagnosis not present

## 2018-09-18 DIAGNOSIS — Z96659 Presence of unspecified artificial knee joint: Secondary | ICD-10-CM | POA: Diagnosis not present

## 2018-09-18 DIAGNOSIS — G4733 Obstructive sleep apnea (adult) (pediatric): Secondary | ICD-10-CM | POA: Diagnosis not present

## 2018-09-28 MED FILL — BELSOMRA 20 MG TABLET: 20 | 30 days supply | Qty: 30 | Fill #2

## 2018-09-28 MED FILL — ESCITALOPRAM 20 MG TABLET: 20 | 90 days supply | Qty: 90 | Fill #1

## 2018-10-06 DIAGNOSIS — M67911 Unspecified disorder of synovium and tendon, right shoulder: Secondary | ICD-10-CM | POA: Diagnosis not present

## 2018-10-15 MED FILL — ALLOPURINOL 300 MG TABS: 300 | 90 days supply | Qty: 90 | Fill #3

## 2018-11-05 MED FILL — FENOFIBRATE 160 MG TABLET: 160 | 90 days supply | Qty: 90 | Fill #3

## 2018-11-09 MED FILL — ROSUVASTATIN CALCIUM 40 MG: 40 | 90 days supply | Qty: 90 | Fill #3

## 2018-11-09 MED FILL — OMEGA-3 ETHYL ESTER 1 GM CA: 1 | 90 days supply | Qty: 360 | Fill #3

## 2018-11-09 MED FILL — BELSOMRA 20 MG TABLET: 20 | 30 days supply | Qty: 30 | Fill #3

## 2018-11-19 DIAGNOSIS — L308 Other specified dermatitis: Secondary | ICD-10-CM | POA: Diagnosis not present

## 2018-11-19 MED FILL — BETAMETHASONE DP 0.05% CRM: 0.05 | 30 days supply | Qty: 90 | Fill #0

## 2018-12-04 MED FILL — CLOPIDOGREL 75 MG TABLET: 75 | 90 days supply | Qty: 90 | Fill #3

## 2018-12-12 MED FILL — BELSOMRA 20 MG TABLET: 20 | 30 days supply | Qty: 30 | Fill #4

## 2018-12-12 MED FILL — AMLODIPINE-OLMESARTAN 5-40: 5-40 | 90 days supply | Qty: 90 | Fill #3

## 2018-12-24 ENCOUNTER — Telehealth: Payer: Self-pay | Admitting: Adult Health

## 2018-12-24 NOTE — Telephone Encounter (Signed)
Received call from wife on DPR asking if papers have been faxed as requested. I advised her they have not; I advised I need to ask the NP exactly what papers he is referring to. She stated she is not sure but asked that his CPAP compliance report be faxed and if any questions,  his phone be called [phone]. I advised will send to NP and will fax CPAP report for past 6 months. Advised her he can call or send my chart message if he needs other information. She verbalized understanding, appreciation.

## 2018-12-24 NOTE — Telephone Encounter (Signed)
Patient's wife [name] calling to request the yearly form that they pick up from [place] to bring to his physical for his CD[place] drivers license be faxed to patient instead since they live far away. This has to do with his sleep & Cpap information  Can we fax to [phone] or email to [email or web address]. Please call patient to let him know what we can do.

## 2018-12-25 NOTE — Telephone Encounter (Signed)
Called patient to ask if he needed any further documentation and if he received CPAP report I faxed yesterday. He did receive fax, is at the dr's office today for his physical. He stated if he needs more information he will call back, verbalized appreciation for call.

## 2018-12-25 NOTE — Telephone Encounter (Signed)
I think its just my office note? You can ask casey or Cyril Mourning to be sure.

## 2018-12-31 MED FILL — ESCITALOPRAM 20 MG TABLET: 20 | 90 days supply | Qty: 90 | Fill #2

## 2019-01-01 MED FILL — BETAMETHASONE DP 0.05% CRM: 0.05 | 30 days supply | Qty: 90 | Fill #1

## 2019-01-08 MED FILL — ALLOPURINOL 300 MG TABS: 300 | 90 days supply | Qty: 90 | Fill #0 | Status: TO

## 2019-01-23 MED FILL — FENOFIBRATE 160 MG TABLET: 160 | 90 days supply | Qty: 90 | Fill #0

## 2019-01-23 MED FILL — OMEGA-3 ETHYL ESTER 1 GM CA: 1 | 90 days supply | Qty: 360 | Fill #0

## 2019-01-25 MED FILL — BELSOMRA 20 MG TABLET: 20 | 30 days supply | Qty: 30 | Fill #0

## 2019-02-09 MED FILL — ROSUVASTATIN CALCIUM 40 MG: 40 | 90 days supply | Qty: 90 | Fill #0

## 2019-03-06 MED FILL — CLOPIDOGREL 75 MG TABLET: 75 | 90 days supply | Qty: 90 | Fill #0

## 2019-03-06 MED FILL — AMLODIPINE-OLMESARTAN 5-40: 5-40 | 90 days supply | Qty: 90 | Fill #0

## 2019-03-07 MED FILL — BELSOMRA 20 MG TABLET: 20 | 30 days supply | Qty: 30 | Fill #0

## 2019-03-30 MED FILL — ESCITALOPRAM 20 MG TABLET: 20 | 90 days supply | Qty: 90 | Fill #0

## 2019-04-08 MED FILL — ALLOPURINOL 300 MG TAB: 300 | 90 days supply | Qty: 90 | Fill #0

## 2019-05-03 MED FILL — FENOFIBRATE 160 MG TABLET: 160 | 90 days supply | Qty: 90 | Fill #1

## 2019-05-08 DIAGNOSIS — N189 Chronic kidney disease, unspecified: Secondary | ICD-10-CM | POA: Diagnosis not present

## 2019-05-08 DIAGNOSIS — E669 Obesity, unspecified: Secondary | ICD-10-CM | POA: Diagnosis not present

## 2019-05-08 DIAGNOSIS — D631 Anemia in chronic kidney disease: Secondary | ICD-10-CM | POA: Diagnosis not present

## 2019-05-08 DIAGNOSIS — N183 Chronic kidney disease, stage 3 (moderate): Secondary | ICD-10-CM | POA: Diagnosis not present

## 2019-05-08 DIAGNOSIS — I10 Essential (primary) hypertension: Secondary | ICD-10-CM | POA: Diagnosis not present

## 2019-05-08 DIAGNOSIS — Z905 Acquired absence of kidney: Secondary | ICD-10-CM | POA: Diagnosis not present

## 2019-05-09 DIAGNOSIS — I129 Hypertensive chronic kidney disease with stage 1 through stage 4 chronic kidney disease, or unspecified chronic kidney disease: Secondary | ICD-10-CM | POA: Diagnosis not present

## 2019-05-09 DIAGNOSIS — F3342 Major depressive disorder, recurrent, in full remission: Secondary | ICD-10-CM | POA: Diagnosis not present

## 2019-05-09 DIAGNOSIS — Z0000 Encounter for general adult medical examination without abnormal findings: Secondary | ICD-10-CM | POA: Diagnosis not present

## 2019-05-09 DIAGNOSIS — N183 Chronic kidney disease, stage 3 (moderate): Secondary | ICD-10-CM | POA: Diagnosis not present

## 2019-05-09 DIAGNOSIS — M1A09X1 Idiopathic chronic gout, multiple sites, with tophus (tophi): Secondary | ICD-10-CM | POA: Diagnosis not present

## 2019-05-09 DIAGNOSIS — F5101 Primary insomnia: Secondary | ICD-10-CM | POA: Diagnosis not present

## 2019-05-09 MED FILL — BELSOMRA 20 MG TABLET: 20 | 90 days supply | Qty: 90 | Fill #0

## 2019-05-14 MED FILL — ROSUVASTATIN CALCIUM 40 MG: 40 | 90 days supply | Qty: 90 | Fill #1

## 2019-06-04 MED FILL — AMLODIPINE-OLMESARTAN 5-40: 5-40 | 90 days supply | Qty: 90 | Fill #0

## 2019-06-04 MED FILL — CLOPIDOGREL 75 MG TABLET: 75 | 90 days supply | Qty: 90 | Fill #0

## 2019-06-23 ENCOUNTER — Encounter: Payer: Self-pay | Admitting: Adult Health

## 2019-06-25 ENCOUNTER — Ambulatory Visit: Payer: 59 | Admitting: Adult Health

## 2019-06-25 ENCOUNTER — Other Ambulatory Visit: Payer: Self-pay

## 2019-06-25 ENCOUNTER — Encounter: Payer: Self-pay | Admitting: Adult Health

## 2019-06-25 VITALS — BP 121/69 | HR 56 | Temp 97.5°F | Ht 70.0 in | Wt 256.6 lb

## 2019-06-25 DIAGNOSIS — G4733 Obstructive sleep apnea (adult) (pediatric): Secondary | ICD-10-CM

## 2019-06-25 DIAGNOSIS — Z9989 Dependence on other enabling machines and devices: Secondary | ICD-10-CM

## 2019-06-25 NOTE — Progress Notes (Signed)
PATIENT: [name] DOB: 12-24-1955  REASON FOR VISIT: follow up HISTORY FROM: patient  HISTORY OF PRESENT ILLNESS: Today 06/25/19:  [name] is a 63 year old male with a history of obstructive sleep apnea on CPAP.  He returns today for follow-up.  His download indicates that he uses machine 28 out of 30 days for compliance of 93%.  He uses machine greater than 4 hours each night.  On average he uses machine 6 hours and 51 minutes.  His residual AHI is 5.5 on 5 to 15 cm of water with EPR 3.  His leak in the 95th percentile is 3.9.  His pressure in the 95th percentile is 12.8.  He reports that the CPAP continues to work well for him.  He denies any new issues.  He joins me today for follow-up.  HISTORY /02/19: [name] is a 63 year old male with a history of obstructive sleep apnea on CPAP.  He returns today for follow-up.  We are unable to obtain a wireless download as his machine is dated.  He forgot to bring his memory card with him today.  He reports that he does use the machine nightly.  He denies having any problems with the machine.  He reports that the new pressure seems to work well for him.  He would like a new machine as his machine is 62 years old.  He returns today for evaluation.   REVIEW OF SYSTEMS: Out of a complete 14 system review of symptoms, the patient complains only of the following symptoms, and all other reviewed systems are negative.  See HPI Epworth sleepiness score 12, fatigue severity score 38  ALLERGIES: No Known Allergies  HOME MEDICATIONS: Outpatient Medications Prior to Visit  Medication Sig Dispense Refill  . allopurinol (ZYLOPRIM) 300 MG tablet Take 300 mg by mouth daily.    Marland Kitchen amLODipine-olmesartan (AZOR) 5-40 MG per tablet Take 1 tablet by mouth every morning.     . citalopram (CELEXA) 20 MG tablet Take 20 mg by mouth daily.    . clopidogrel (PLAVIX) 75 MG tablet TAKE 1 TABLET EVERY DAY 90 tablet 1  . colchicine (COLCRYS) 0.6 MG tablet  Take 0.6 mg by mouth daily.    . fenofibrate 160 MG tablet Take 160 mg by mouth daily.    . rosuvastatin (CRESTOR) 40 MG tablet Take by mouth.    . Suvorexant 20 MG TABS Take by mouth.     No facility-administered medications prior to visit.     PAST MEDICAL HISTORY: Past Medical History:  Diagnosis Date  . Arthritis   . Depression   . High cholesterol   . Hypertension   . Renal cell carcinoma   . 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 Dr Einar Gip stopped med, no seizures since  . Sleep apnea    CPAP every night  . Stroke ([place]) 11/03/2011   no deficits    PAST SURGICAL HISTORY: Past Surgical History:  Procedure Laterality Date  . ANTERIOR CRUCIATE LIGAMENT REPAIR    . BICEPS TENDON REPAIR    . CARPAL TUNNEL RELEASE    . CARPAL TUNNEL RELEASE Left 02/26/2016   Procedure: LEFT LIMITED OPEN CARPAL TUNNEL RELEASE;  Surgeon: Roseanne Kaufman, MD;  Location: [facility];  Service: Orthopedics;  Laterality: Left;  . KNEE ARTHROSCOPY    . NEPHRECTOMY    . TOTAL KNEE ARTHROPLASTY  11/16/2012   Procedure: TOTAL KNEE ARTHROPLASTY;  Surgeon: Mcarthur Rossetti, MD;  Location[facility];  Service: Orthopedics;  Laterality: Left;  Left Total Knee Arthroplasty    FAMILY HISTORY: Family History  Problem Relation Age of Onset  . Coronary artery disease Father     SOCIAL HISTORY: Social History   Socioeconomic History  . Marital status: Married    Spouse name: [name]  . Number of children: 2  . Years of education: College  . Highest education level: Not on file  Occupational History  . Occupation: Psychologist, sport and exercise    Employer: [employer]     Comment: Self Employed  Social Needs  . Financial resource strain: Not on file  . Food insecurity    Worry: Not on file    Inability: Not on file  . Transportation needs    Medical: Not on file    Non-medical: Not on file  Tobacco Use  . Smoking status: Former Smoker    Years: 2.00    Types:  Cigarettes    Quit date: 12/05/1988    Years since quitting: 30.5  . Smokeless tobacco: Never Used  . Tobacco comment: QUIT SMOKING OVER 21 YEAR'S AGO  ( 1970 'S)  Substance and Sexual Activity  . Alcohol use: Yes    Comment: socially  . Drug use: No  . Sexual activity: Yes  Lifestyle  . Physical activity    Days per week: Not on file    Minutes per session: Not on file  . Stress: Not on file  Relationships  . Social Herbalist on phone: Not on file    Gets together: Not on file    Attends religious service: Not on file    Active member of club or organization: Not on file    Attends meetings of clubs or organizations: Not on file    Relationship status: Not on file  . Intimate partner violence    Fear of current or ex partner: Not on file    Emotionally abused: Not on file    Physically abused: Not on file    Forced sexual activity: Not on file  Other Topics Concern  . 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   social drinker, never a smoker.    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       PHYSICAL EXAM  Vitals:   06/25/19 1118  BP: 121/69  Pulse: (!) 56  Temp: (!) 97.5 F (36.4 C)  Weight: 256 lb 9.6 oz (116.4 kg)  Height: 5\' 10"  (1.778 m)   Body mass index is 36.82 kg/m.  Generalized: Well developed, in no acute distress  Chest: Lungs clear to auscultation bilaterally  Neurological examination  Mentation: Alert oriented to time, place, history taking. Follows all commands speech and language fluent Cranial nerve II-XII: Pupils were equal round reactive to light. Extraocular movements were full, visual field were full on confrontational test. . Head turning and shoulder shrug  were normal and symmetric. Motor: The motor testing reveals 5 over 5 strength of all 4  extremities. Good symmetric motor tone is noted throughout.  Gait and station: Gait is normal.    DIAGNOSTIC DATA (LABS, IMAGING, TESTING) - I reviewed patient records, labs, notes, testing and imaging myself where available.  Lab Results  Component Value Date   WBC 8.5 11/21/2012   HGB 8.1 (L)  11/21/2012   HCT 23.6 (L) 11/21/2012   MCV 82.8 11/21/2012   PLT 211 11/21/2012      Component Value Date/Time   NA 136 11/21/2012 0350   K 4.2 11/21/2012 0350   CL 103 11/21/2012 0350   CO2 24 11/21/2012 0350   GLUCOSE 98 11/21/2012 0350   BUN 23 11/21/2012 0350   CREATININE 1.55 (H) 11/21/2012 0350   CALCIUM 8.4 11/21/2012 0350   PROT 5.9 (L) 11/20/2012 0415   ALBUMIN 2.7 (L) 11/20/2012 0415   AST 14 11/20/2012 0415   ALT 9 11/20/2012 0415   ALKPHOS 67 11/20/2012 0415   BILITOT 0.3 11/20/2012 0415   GFRNONAA 49 (L) 11/21/2012 0350   GFRAA 57 (L) 11/21/2012 0350   Lab Results  Component Value Date   CHOL 103 11/18/2012   HDL 31 (L) 11/18/2012   LDLCALC 39 11/18/2012   TRIG 166 (H) 11/18/2012   CHOLHDL 3.3 11/18/2012   Lab Results  Component Value Date   HGBA1C 5.4 11/18/2012   No results found for: VITAMINB12 Lab Results  Component Value Date   TSH 3.714 11/20/2012      ASSESSMENT AND PLAN 63 y.o. year old male  has a past medical history of Arthritis, Depression, High cholesterol, Hypertension, Renal cell carcinoma, Seizures ([place]), Sleep apnea, and Stroke ([place]) (11/03/2011). here with:  1.  Obstructive sleep apnea on CPAP  The patient CPAP download shows excellent compliance.  His residual AHI is just slightly elevated.  I will increase his pressure to 5 to 17 cm of water.  He is encouraged to continue using the CPAP nightly and greater than 4 hours each night.  He is advised that if his symptoms worsen or he develops new symptoms he should let us know.  He will follow-up in 1 year or sooner if needed.   I spent 15 minutes with the patient. 50% of this time was  spent reviewing his CPAP download   Ward Givens, MSN, NP-C 06/25/2019, 11:17 AM [facility] [address] [place], [place] [address] [phone]

## 2019-06-25 NOTE — Patient Instructions (Signed)
Continue using CPAP nightly and greater than 4 hours each night I will send order to DME to increase pressure 5-17 cm H20.  If your symptoms worsen or you develop new symptoms please let us know.

## 2019-06-26 DIAGNOSIS — G4733 Obstructive sleep apnea (adult) (pediatric): Secondary | ICD-10-CM | POA: Diagnosis not present

## 2019-06-26 NOTE — Progress Notes (Signed)
Received cpap orders per J[facility]. sy

## 2019-07-01 MED FILL — ALLOPURINOL 300 MG TABS: 300 | 90 days supply | Qty: 90 | Fill #0

## 2019-07-02 MED FILL — ESCITALOPRAM 20 MG TABLET: 20 | 90 days supply | Qty: 90 | Fill #0

## 2019-07-25 MED FILL — OMEGA-3 ETHYL ESTER 1 GM CA: 1 | 90 days supply | Qty: 360 | Fill #1

## 2019-07-25 MED FILL — FENOFIBRATE 160 MG TABLET: 160 | 90 days supply | Qty: 90 | Fill #2

## 2019-09-09 MED FILL — CLOPIDOGREL 75 MG TABLET: 75 | 90 days supply | Qty: 90 | Fill #1

## 2019-09-09 MED FILL — AMLODIPINE-OLMESARTAN 5-40: 5-40 | 90 days supply | Qty: 90 | Fill #1

## 2019-10-03 MED FILL — ALLOPURINOL 300 MG TABS: 300 | 90 days supply | Qty: 90 | Fill #1

## 2019-10-03 MED FILL — ESCITALOPRAM 20 MG TABLET: 20 | 90 days supply | Qty: 90 | Fill #1

## 2019-10-16 DIAGNOSIS — J3489 Other specified disorders of nose and nasal sinuses: Secondary | ICD-10-CM | POA: Diagnosis not present

## 2019-10-16 DIAGNOSIS — Z20828 Contact with and (suspected) exposure to other viral communicable diseases: Secondary | ICD-10-CM | POA: Diagnosis not present

## 2019-10-16 DIAGNOSIS — Z7189 Other specified counseling: Secondary | ICD-10-CM | POA: Diagnosis not present

## 2019-10-30 MED FILL — FENOFIBRATE 160 MG TABLET: 160 | 90 days supply | Qty: 90 | Fill #3

## 2019-11-22 DIAGNOSIS — Z20828 Contact with and (suspected) exposure to other viral communicable diseases: Secondary | ICD-10-CM | POA: Diagnosis not present

## 2019-11-22 DIAGNOSIS — R05 Cough: Secondary | ICD-10-CM | POA: Diagnosis not present

## 2019-12-02 DIAGNOSIS — R1084 Generalized abdominal pain: Secondary | ICD-10-CM | POA: Diagnosis not present

## 2019-12-02 DIAGNOSIS — Z87442 Personal history of urinary calculi: Secondary | ICD-10-CM | POA: Diagnosis not present

## 2019-12-03 MED FILL — ROSUVASTATIN CALCIUM 40 MG: 40 | 80 days supply | Qty: 80 | Fill #1

## 2019-12-09 DIAGNOSIS — Z23 Encounter for immunization: Secondary | ICD-10-CM | POA: Diagnosis not present

## 2019-12-09 MED FILL — AMLODIPINE-OLMESARTAN 5-40: 5-40 | 90 days supply | Qty: 90 | Fill #2

## 2019-12-09 MED FILL — CLOPIDOGREL 75 MG TABLET: 75 | 90 days supply | Qty: 90 | Fill #2

## 2019-12-25 DIAGNOSIS — N183 Chronic kidney disease, stage 3 unspecified: Secondary | ICD-10-CM | POA: Diagnosis not present

## 2020-01-01 DIAGNOSIS — N183 Chronic kidney disease, stage 3 unspecified: Secondary | ICD-10-CM | POA: Diagnosis not present

## 2020-01-01 DIAGNOSIS — I10 Essential (primary) hypertension: Secondary | ICD-10-CM | POA: Diagnosis not present

## 2020-01-01 DIAGNOSIS — Z905 Acquired absence of kidney: Secondary | ICD-10-CM | POA: Diagnosis not present

## 2020-01-01 DIAGNOSIS — D631 Anemia in chronic kidney disease: Secondary | ICD-10-CM | POA: Diagnosis not present

## 2020-01-01 DIAGNOSIS — E559 Vitamin D deficiency, unspecified: Secondary | ICD-10-CM | POA: Diagnosis not present

## 2020-01-01 DIAGNOSIS — E669 Obesity, unspecified: Secondary | ICD-10-CM | POA: Diagnosis not present

## 2020-01-07 MED FILL — ALLOPURINOL 300 MG TABS: 300 | 90 days supply | Qty: 90 | Fill #0

## 2020-01-07 MED FILL — ESCITALOPRAM 20 MG TABLET: 20 | 90 days supply | Qty: 90 | Fill #2

## 2020-02-12 MED FILL — OMEGA-3 ETHYL ESTERS 1 GM C: 1 | 90 days supply | Qty: 360 | Fill #0

## 2020-02-12 MED FILL — FENOFIBRATE 160 MG TABLET: 160 | 90 days supply | Qty: 90 | Fill #0

## 2020-02-12 MED FILL — ROSUVASTATIN CALCIUM 40 MG: 40 | 80 days supply | Qty: 80 | Fill #0

## 2020-03-19 ENCOUNTER — Other Ambulatory Visit (HOSPITAL_COMMUNITY): Payer: Self-pay | Admitting: Family Medicine

## 2020-03-19 MED FILL — CLOPIDOGREL 75 MG TABLET: 75 | 90 days supply | Qty: 90 | Fill #0

## 2020-03-19 MED FILL — AMLODIPINE-OLMESARTAN 5-40: 5-40 | 90 days supply | Qty: 90 | Fill #0

## 2020-03-26 DIAGNOSIS — G4733 Obstructive sleep apnea (adult) (pediatric): Secondary | ICD-10-CM | POA: Diagnosis not present

## 2020-04-20 MED FILL — ALLOPURINOL 300 MG TABS: 300 | 90 days supply | Qty: 90 | Fill #1

## 2020-05-08 MED FILL — FENOFIBRATE 160 MG TABLET: 160 | 90 days supply | Qty: 90 | Fill #1

## 2020-05-12 DIAGNOSIS — Z1389 Encounter for screening for other disorder: Secondary | ICD-10-CM | POA: Diagnosis not present

## 2020-05-12 DIAGNOSIS — Z1329 Encounter for screening for other suspected endocrine disorder: Secondary | ICD-10-CM | POA: Diagnosis not present

## 2020-05-12 DIAGNOSIS — Z0000 Encounter for general adult medical examination without abnormal findings: Secondary | ICD-10-CM | POA: Diagnosis not present

## 2020-05-12 DIAGNOSIS — Z13220 Encounter for screening for lipoid disorders: Secondary | ICD-10-CM | POA: Diagnosis not present

## 2020-05-12 DIAGNOSIS — E782 Mixed hyperlipidemia: Secondary | ICD-10-CM | POA: Diagnosis not present

## 2020-05-25 ENCOUNTER — Other Ambulatory Visit (HOSPITAL_COMMUNITY): Payer: Self-pay | Admitting: Family Medicine

## 2020-05-25 MED FILL — ROSUVASTATIN CALCIUM 40 MG: 40 | 90 days supply | Qty: 90 | Fill #0

## 2020-06-16 MED FILL — AMLODIPINE-OLMESARTAN 5-40: 5-40 | 90 days supply | Qty: 90 | Fill #1

## 2020-06-16 MED FILL — CLOPIDOGREL 75 MG TABLET: 75 | 90 days supply | Qty: 90 | Fill #1

## 2020-06-24 ENCOUNTER — Ambulatory Visit: Payer: 59 | Admitting: Adult Health

## 2020-07-06 ENCOUNTER — Other Ambulatory Visit (HOSPITAL_COMMUNITY): Payer: Self-pay | Admitting: Family Medicine

## 2020-07-06 MED FILL — ALLOPURINOL 300 MG TABS: 300 | 90 days supply | Qty: 90 | Fill #0

## 2020-07-13 LAB — CBC WITH DIFFERENTIAL
Hematocrit: 26.8 % — ABNORMAL LOW (ref 39.0–51.0)
Hemoglobin: 8.3 g/dL — ABNORMAL LOW (ref 13.0–17.0)
MCV: 75 fL — ABNORMAL LOW (ref 80–100)
Platelets count: 163 10*3/uL (ref 140–400)
RBC's, nucleated: 0 /100{WBCs} (ref <=0)
RDW, RBC: 20.1 % — ABNORMAL HIGH (ref 12.0–16.5)
Red blood cells count: 3.59 M/uL — ABNORMAL LOW (ref 4.10–5.70)
WBC Count: 9.9 10*3/uL (ref 3.7–11.1)

## 2020-07-13 LAB — CBC NO DIFFERENTIAL
Hematocrit: 29.9 % — ABNORMAL LOW (ref 39.0–51.0)
Hemoglobin: 9.2 g/dL — ABNORMAL LOW (ref 13.0–17.0)
MCV: 76 fL — ABNORMAL LOW (ref 80–100)
Platelets count: 195 10*3/uL (ref 140–400)
RBC's, nucleated: 0 /100{WBCs} (ref <=0)
RDW, RBC: 20.6 % — ABNORMAL HIGH (ref 12.0–16.5)
Red blood cells count: 3.95 M/uL — ABNORMAL LOW (ref 4.10–5.70)
WBC Count: 14.6 10*3/uL — ABNORMAL HIGH (ref 3.7–11.1)

## 2020-07-13 LAB — BILIRUBIN TOTAL: Bilirubin Total: 0.5 mg/dL (ref 0.2–1.2)

## 2020-07-13 LAB — MAGNESIUM (MG): Magnesium (Mg): 1.7 mg/dL (ref 1.7–2.3)

## 2020-07-13 LAB — COVID-2019 RNA, QUAL: COVID-19 Results (Outside Organization): NOT DETECTED

## 2020-07-13 LAB — WBC AUTO DIFF (OUTSIDE ORGANIZATION)
Basophils % Auto: 0 % (ref 0–1)
Eosinophils % Auto: 0 % (ref 0–7)
Immature Granulocytes % Auto: 1 % (ref 0–1)
Lymphocytes % Auto: 15 % (ref 15–47)
Monocytes % Auto: 5 % (ref 5–13)
Neutrophils % Auto: 79 % — ABNORMAL HIGH (ref 42–76)
Neutrophils, Automated Count: 7.9 10*3/uL (ref 1.8–7.9)

## 2020-07-13 LAB — CHEM 7 (NA, K, CL, CO2, BUN, GLUC, CR) (OUTSIDE ORGANIZATION)
Anion Gap: 11 meq/L (ref 5–16)
CO2: 21 meq/L — ABNORMAL LOW (ref 24–33)
Chloride: 111 meq/L (ref 100–111)
Creatinine Blood: 5.85 mg/dL — ABNORMAL HIGH (ref <=1.34)
Glomerular Filtration Rate - African American: 11 mL/min — ABNORMAL LOW (ref >=60)
Glomerular filtration rate, nonAfrican American: 9 mL/min — ABNORMAL LOW (ref >=60)
Glucose: 106 mg/dL (ref 60–159)
Potassium: 4.2 meq/L (ref 3.5–5.3)
Sodium: 143 meq/L (ref 135–145)
Urea Nitrogen, Blood (BUN): 39 mg/dL — ABNORMAL HIGH (ref 7–27)

## 2020-07-13 LAB — INR
INR: 1.1 Ratio
Prothrombin Time: 13.4 s (ref 11.7–14.3)

## 2020-07-13 LAB — PHOSPHORUS (PO4): Phosphorus (PO4): 4.9 mg/dL — ABNORMAL HIGH (ref 2.7–4.5)

## 2020-07-13 LAB — TROPONIN I
Troponin I: 0.19 ng/mL — ABNORMAL HIGH (ref 0.00–0.04)
Troponin I: 0.17 ng/mL — ABNORMAL HIGH (ref 0.00–0.04)

## 2020-07-13 LAB — ASPARTATE TRANSAMINASE (AST): Aspartate Transaminase (AST): 19 U/L (ref 10–40)

## 2020-07-13 LAB — ALKALINE PHOSPHATASE (ALP): Alkaline Phosphatase (ALP): 54 U/L (ref 37–117)

## 2020-07-13 LAB — LIPASE: Lipase: 24 U/L (ref <=90)

## 2020-07-13 LAB — CALCIUM: Calcium: 8 mg/dL — ABNORMAL LOW (ref 8.8–10.5)

## 2020-07-13 LAB — ALANINE TRANSFERASE (ALT): Alanine Transferase (ALT): 11 U/L (ref 0–47)

## 2020-07-14 LAB — URINALYSIS, FOLEY CATHETER, AUTOMATED (OUTSIDE ORGANIZATION)
Bilirubin, UA: NEGATIVE mg/dL
Glucose, UA: NEGATIVE
Ketones, UA: NEGATIVE mg/dL
Nitrite, UA: NEGATIVE mg/dL
Protein, UA: >500 mg/dL — ABNORMAL HIGH (ref 0–29)
Specific gravity, UA: 1.012 (ref 1.001–1.035)
Urobilinogen, UA: <2 mg/dL (ref 0.0–2.0)
pH, UA: 5 (ref 4.5–8.0)

## 2020-07-14 LAB — URINALYSIS W/MICROSCOPIC
RBC, urine: 17 /[HPF] — ABNORMAL HIGH (ref 0–3)
Squamous cells count, ur sed, Qn: 63 /[LPF] — ABNORMAL HIGH (ref 0–2)
WBC, urine: 34 /[HPF] — ABNORMAL HIGH (ref 0–5)

## 2020-07-14 LAB — WBC AUTO DIFF (OUTSIDE ORGANIZATION)
Basophils % Auto: 0 % (ref 0–1)
Eosinophils % Auto: 0 % (ref 0–7)
Immature Granulocytes % Auto: 0 % (ref 0–1)
Lymphocytes % Auto: 18 % (ref 15–47)
Monocytes % Auto: 6 % (ref 5–13)
Neutrophils % Auto: 75 % (ref 42–76)
Neutrophils, Automated Count: 6.1 10*3/uL (ref 1.8–7.9)

## 2020-07-14 LAB — CHEM 7 (NA, K, CL, CO2, BUN, GLUC, CR) (OUTSIDE ORGANIZATION)
Anion Gap: 13 meq/L (ref 5–16)
CO2: 19 meq/L — ABNORMAL LOW (ref 24–33)
Chloride: 111 meq/L (ref 100–111)
Creatinine Blood: 6 mg/dL — ABNORMAL HIGH (ref <=1.34)
Glucose: 102 mg/dL (ref 60–159)
Potassium: 4.1 meq/L (ref 3.5–5.3)
Sodium: 143 meq/L (ref 135–145)
Urea Nitrogen, Blood (BUN): 41 mg/dL — ABNORMAL HIGH (ref 7–27)

## 2020-07-14 LAB — CBC WITH DIFFERENTIAL
Hematocrit: 23.3 % — ABNORMAL LOW (ref 39.0–51.0)
Hemoglobin: 7.1 g/dL — ABNORMAL LOW (ref 13.0–17.0)
MCV: 75 fL — ABNORMAL LOW (ref 80–100)
Platelets count: 154 10*3/uL (ref 140–400)
RBC's, nucleated: 0 /100{WBCs} (ref <=0)
RDW, RBC: 19.9 % — ABNORMAL HIGH (ref 12.0–16.5)
Red blood cells count: 3.09 M/uL — ABNORMAL LOW (ref 4.10–5.70)
WBC Count: 8.1 10*3/uL (ref 3.7–11.1)

## 2020-07-14 LAB — PROTEIN/CREATININE RATIO, URINE (OUTSIDE ORGANIZATION)
Creatinine, Urine: 121 mg/dL
Protein Total Spot Urine: 629.6 mg/dL
Protein/Creatinine Ratio mg/mg: 5.2 mg/mg — ABNORMAL HIGH (ref <=0.19)

## 2020-07-14 LAB — TROPONIN I
Troponin I: 0.16 ng/mL — ABNORMAL HIGH (ref 0.00–0.04)
Troponin I: 0.2 ng/mL — ABNORMAL HIGH (ref 0.00–0.04)

## 2020-07-14 LAB — BILIRUBIN, TOTAL AND DIRECT (OUTSIDE ORGANIZATION)
Bilirubin Direct: 0.1 mg/dL (ref 0.0–0.3)
Bilirubin Total: 0.3 mg/dL (ref 0.2–1.2)

## 2020-07-14 LAB — INR
INR: 1.1 Ratio
Prothrombin Time: 13.8 s (ref 11.7–14.3)

## 2020-07-14 LAB — HEMOGLOBIN AND HEMATOCRIT (OUTSIDE ORGANIZATION)
Hematocrit: 22.1 % — ABNORMAL LOW (ref 39.0–51.0)
Hematocrit: 25.9 % — ABNORMAL LOW (ref 39.0–51.0)
Hematocrit: 27.3 % — ABNORMAL LOW (ref 39.0–51.0)
Hemoglobin: 6.8 g/dL — ABNORMAL LOW (ref 13.0–17.0)
Hemoglobin: 8.4 g/dL — ABNORMAL LOW (ref 13.0–17.0)
Hemoglobin: 8.7 g/dL — ABNORMAL LOW (ref 13.0–17.0)

## 2020-07-14 LAB — LIPID PANEL WITH DLDL REFLEX
Cholesterol: 232 mg/dL (ref <=239)
HDL Cholesterol: 36 mg/dL — ABNORMAL LOW (ref >=40)
LDL Cholesterol Calculation: 171 mg/dL — ABNORMAL HIGH (ref <=159)
Triglyceride: 124 mg/dL (ref <=499)

## 2020-07-14 LAB — CREATINE KINASE: Creatine Kinase: 133 U/L (ref <=200)

## 2020-07-14 LAB — PHOSPHORUS (PO4): Phosphorus (PO4): 5.5 mg/dL — ABNORMAL HIGH (ref 2.7–4.5)

## 2020-07-14 LAB — ABORH (OUTSIDE ORGANIZATION)
ABORH Blood Type: A NEG
ABORH Blood Type: A NEG

## 2020-07-14 LAB — MAGNESIUM (MG): Magnesium (Mg): 1.7 mg/dL (ref 1.7–2.3)

## 2020-07-14 LAB — CREATININE SPOT URINE: Creatinine, Urine: 121 mg/dL

## 2020-07-14 LAB — ALBUMIN: Albumin: 2.4 g/dL — ABNORMAL LOW (ref 3.7–5.7)

## 2020-07-15 LAB — CHEM 7 (NA, K, CL, CO2, BUN, GLUC, CR) (OUTSIDE ORGANIZATION)
Anion Gap: 11 meq/L (ref 5–16)
CO2: 21 meq/L — ABNORMAL LOW (ref 24–33)
Chloride: 112 meq/L — ABNORMAL HIGH (ref 100–111)
Creatinine Blood: 6.28 mg/dL — ABNORMAL HIGH (ref <=1.34)
Glucose: 94 mg/dL (ref 60–159)
Potassium: 3.7 meq/L (ref 3.5–5.3)
Sodium: 144 meq/L (ref 135–145)
Urea Nitrogen, Blood (BUN): 42 mg/dL — ABNORMAL HIGH (ref 7–27)

## 2020-07-15 LAB — HEMOGLOBIN AND HEMATOCRIT (OUTSIDE ORGANIZATION)
Hematocrit: 24.6 % — ABNORMAL LOW (ref 39.0–51.0)
Hematocrit: 25.2 % — ABNORMAL LOW (ref 39.0–51.0)
Hemoglobin: 8 g/dL — ABNORMAL LOW (ref 13.0–17.0)
Hemoglobin: 7.9 g/dL — ABNORMAL LOW (ref 13.0–17.0)

## 2020-07-15 LAB — IRON AND TOTAL IRON BINDING CAPACITY (OUTSIDE ORGANIZATION)
Iron Percent Saturation: 23 % (ref 14–57)
Iron Total: 37 ug/dL — ABNORMAL LOW (ref 50–212)
Iron binding capacity, unsaturated: 125 ug/dL (ref 110–370)
Total Iron Binding Capacity: 162 ug/dL — ABNORMAL LOW (ref 228–428)

## 2020-07-15 LAB — PHOSPHORUS (PO4): Phosphorus (PO4): 5.2 mg/dL — ABNORMAL HIGH (ref 2.7–4.5)

## 2020-07-15 LAB — CBC WITH DIFFERENTIAL
Hematocrit: 25.5 % — ABNORMAL LOW (ref 39.0–51.0)
Hemoglobin: 8.4 g/dL — ABNORMAL LOW (ref 13.0–17.0)
MCV: 75 fL — ABNORMAL LOW (ref 80–100)
Platelets count: 179 10*3/uL (ref 140–400)
RBC's, nucleated: 0 /100{WBCs} (ref <=0)
RDW, RBC: 20.3 % — ABNORMAL HIGH (ref 12.0–16.5)
Red blood cells count: 3.41 M/uL — ABNORMAL LOW (ref 4.10–5.70)
WBC Count: 8.3 10*3/uL (ref 3.7–11.1)

## 2020-07-15 LAB — PROTEIN ELECTROPHORESIS,SERUM
Albumin: 2.4 g/dL — ABNORMAL LOW (ref 3.8–5.0)
Alpha-1-globulin, electrophoresis: 0.4 g/dL (ref 0.2–0.4)
Alpha-2-globulin, electrophoresis: 0.7 g/dL (ref 0.5–1.0)
Beta globulin, electrophoresis: 0.6 g/dL (ref 0.6–1.2)
Gamma globulin, electrophoresis: 0.9 g/dL (ref 0.7–1.8)
Protein: 5 g/dL — ABNORMAL LOW (ref 6.0–7.7)
Protein: 5 g/dL — ABNORMAL LOW (ref 6.0–7.7)

## 2020-07-15 LAB — WBC AUTO DIFF (OUTSIDE ORGANIZATION)
Basophils % Auto: 1 % (ref 0–1)
Eosinophils % Auto: 1 % (ref 0–7)
Immature Granulocytes % Auto: 1 % (ref 0–1)
Lymphocytes % Auto: 18 % (ref 15–47)
Monocytes % Auto: 8 % (ref 5–13)
Neutrophils % Auto: 72 % (ref 42–76)
Neutrophils, Automated Count: 6 10*3/uL (ref 1.8–7.9)

## 2020-07-15 LAB — HEMOGLOBIN A1C
Hgb A1C,Glucose Est Avg: 82 mg/dL — ABNORMAL LOW (ref 85–126)
Hgb A1c %: 4.5 % (ref <=5.6)

## 2020-07-15 LAB — TRANSFERRIN: Transferrin: 118 mg/dL — ABNORMAL LOW (ref 200–400)

## 2020-07-15 LAB — UREA NITROGEN,URINE: Urea Nitrogen Spot Urine: 271 mg/dL

## 2020-07-15 LAB — PARATHYROID HORMONE INTACT: Parathyroid Hormone Intact: 414 pg/mL — ABNORMAL HIGH (ref 26–115)

## 2020-07-15 LAB — FERRITIN: Ferritin: 566 ng/mL — ABNORMAL HIGH (ref 22–365)

## 2020-07-15 LAB — MAGNESIUM (MG): Magnesium (Mg): 1.8 mg/dL (ref 1.7–2.3)

## 2020-07-16 LAB — CHEM 7 (NA, K, CL, CO2, BUN, GLUC, CR) (OUTSIDE ORGANIZATION)
Anion Gap: 13 meq/L (ref 5–16)
CO2: 20 meq/L — ABNORMAL LOW (ref 24–33)
Chloride: 110 meq/L (ref 100–111)
Creatinine Blood: 6.61 mg/dL — ABNORMAL HIGH (ref <=1.34)
Glucose: 81 mg/dL (ref 60–159)
Potassium: 3.9 meq/L (ref 3.5–5.3)
Sodium: 143 meq/L (ref 135–145)
Urea Nitrogen, Blood (BUN): 44 mg/dL — ABNORMAL HIGH (ref 7–27)

## 2020-07-16 LAB — HEMOGLOBIN AND HEMATOCRIT (OUTSIDE ORGANIZATION)
Hematocrit: 25.9 % — ABNORMAL LOW (ref 39.0–51.0)
Hematocrit: 25.3 % — ABNORMAL LOW (ref 39.0–51.0)
Hemoglobin: 8 g/dL — ABNORMAL LOW (ref 13.0–17.0)
Hemoglobin: 8 g/dL — ABNORMAL LOW (ref 13.0–17.0)

## 2020-07-16 LAB — WBC AUTO DIFF (OUTSIDE ORGANIZATION)
Basophils % Auto: 0 % (ref 0–1)
Eosinophils % Auto: 1 % (ref 0–7)
Immature Granulocytes % Auto: 1 % (ref 0–1)
Lymphocytes % Auto: 19 % (ref 15–47)
Monocytes % Auto: 9 % (ref 5–13)
Neutrophils % Auto: 69 % (ref 42–76)
Neutrophils, Automated Count: 5.6 10*3/uL (ref 1.8–7.9)

## 2020-07-16 LAB — HEPATITIS B SURFACE ANTIGEN: Hep B surface AG: NEGATIVE

## 2020-07-16 LAB — MAGNESIUM (MG): Magnesium (Mg): 1.8 mg/dL (ref 1.7–2.3)

## 2020-07-16 LAB — CBC WITH DIFFERENTIAL
Hematocrit: 25.5 % — ABNORMAL LOW (ref 39.0–51.0)
Hemoglobin: 8 g/dL — ABNORMAL LOW (ref 13.0–17.0)
MCV: 78 fL — ABNORMAL LOW (ref 80–100)
Platelets count: 199 10*3/uL (ref 140–400)
RBC's, nucleated: 0 /100{WBCs} (ref <=0)
RDW, RBC: 20.3 % — ABNORMAL HIGH (ref 12.0–16.5)
Red blood cells count: 3.25 M/uL — ABNORMAL LOW (ref 4.10–5.70)
WBC Count: 8.1 10*3/uL (ref 3.7–11.1)

## 2020-07-16 LAB — HIV AG/AB COMBO SCREEN

## 2020-07-16 LAB — HEPATITIS C AB SCREEN: Hep C AB: POSITIVE — AB

## 2020-07-16 LAB — PHOSPHORUS (PO4): Phosphorus (PO4): 5.8 mg/dL — ABNORMAL HIGH (ref 2.7–4.5)

## 2020-07-17 LAB — CBC WITH DIFFERENTIAL
Hematocrit: 26.4 % — ABNORMAL LOW (ref 39.0–51.0)
Hemoglobin: 8.3 g/dL — ABNORMAL LOW (ref 13.0–17.0)
MCV: 77 fL — ABNORMAL LOW (ref 80–100)
Platelets count: 198 10*3/uL (ref 140–400)
RBC's, nucleated: 0 /100{WBCs} (ref <=0)
RDW, RBC: 20.3 % — ABNORMAL HIGH (ref 12.0–16.5)
Red blood cells count: 3.42 M/uL — ABNORMAL LOW (ref 4.10–5.70)
WBC Count: 7.8 10*3/uL (ref 3.7–11.1)

## 2020-07-17 LAB — WBC AUTO DIFF (OUTSIDE ORGANIZATION)
Basophils % Auto: 1 % (ref 0–1)
Eosinophils % Auto: 2 % (ref 0–7)
Immature Granulocytes % Auto: 1 % (ref 0–1)
Lymphocytes % Auto: 19 % (ref 15–47)
Monocytes % Auto: 9 % (ref 5–13)
Neutrophils % Auto: 67 % (ref 42–76)
Neutrophils, Automated Count: 5.3 10*3/uL (ref 1.8–7.9)

## 2020-07-17 LAB — CHEM 7 (NA, K, CL, CO2, BUN, GLUC, CR) (OUTSIDE ORGANIZATION)
Anion Gap: 10 meq/L (ref 5–16)
CO2: 24 meq/L (ref 24–33)
Chloride: 107 meq/L (ref 100–111)
Creatinine Blood: 5.62 mg/dL — ABNORMAL HIGH (ref <=1.34)
Glucose: 85 mg/dL (ref 60–159)
Potassium: 4 meq/L (ref 3.5–5.3)
Sodium: 141 meq/L (ref 135–145)
Urea Nitrogen, Blood (BUN): 34 mg/dL — ABNORMAL HIGH (ref 7–27)

## 2020-07-17 LAB — HEPATITIS B SURFACE ANTIBODY: HEP B VIRUS SURFACE ANTIBODY: POSITIVE

## 2020-07-17 LAB — HEMOGLOBIN AND HEMATOCRIT (OUTSIDE ORGANIZATION)
Hematocrit: 27.2 % — ABNORMAL LOW (ref 39.0–51.0)
Hematocrit: 26.4 % — ABNORMAL LOW (ref 39.0–51.0)
Hemoglobin: 8.6 g/dL — ABNORMAL LOW (ref 13.0–17.0)
Hemoglobin: 8.5 g/dL — ABNORMAL LOW (ref 13.0–17.0)

## 2020-07-17 LAB — PHOSPHORUS (PO4): Phosphorus (PO4): 4.9 mg/dL — ABNORMAL HIGH (ref 2.7–4.5)

## 2020-07-17 LAB — HEPATITIS B SURFACE ANTIGEN: Hep B surface AG: NEGATIVE

## 2020-07-17 LAB — HEPATITIS C RNA PCR-QUAL(ARUP)
HCV Quant by NAAT Interpretation: DETECTED — AB
HCV Quant by NAAT Interpretation: DETECTED — AB

## 2020-07-17 LAB — MAGNESIUM (MG): Magnesium (Mg): 1.8 mg/dL (ref 1.7–2.3)

## 2020-07-17 LAB — HEPATITIS B CORE AB, TOTAL: HEP B VIRUS CORE AB: NEGATIVE

## 2020-07-17 LAB — HEPATITIS C AB SCREEN: Hep C AB: POSITIVE — AB

## 2020-07-18 LAB — WBC AUTO DIFF (OUTSIDE ORGANIZATION)
Basophils % Auto: 1 % (ref 0–1)
Eosinophils % Auto: 2 % (ref 0–7)
Immature Granulocytes % Auto: 2 % — ABNORMAL HIGH (ref 0–1)
Lymphocytes % Auto: 20 % (ref 15–47)
Monocytes % Auto: 8 % (ref 5–13)
Neutrophils % Auto: 66 % (ref 42–76)
Neutrophils, Automated Count: 6.1 10*3/uL (ref 1.8–7.9)

## 2020-07-18 LAB — CHEM 7 (NA, K, CL, CO2, BUN, GLUC, CR) (OUTSIDE ORGANIZATION)
Anion Gap: 10 meq/L (ref 5–16)
CO2: 25 meq/L (ref 24–33)
Chloride: 103 meq/L (ref 100–111)
Creatinine Blood: 4.79 mg/dL — ABNORMAL HIGH (ref <=1.34)
Creatinine Blood: 4.79 mg/dL — ABNORMAL HIGH (ref <=1.34)
Glucose: 89 mg/dL (ref 60–159)
Glucose: 89 mg/dL (ref 60–159)
Potassium: 4 meq/L (ref 3.5–5.3)
Sodium: 138 meq/L (ref 135–145)
Urea Nitrogen, Blood (BUN): 27 mg/dL (ref 7–27)

## 2020-07-18 LAB — CBC WITH DIFFERENTIAL
Hematocrit: 30 % — ABNORMAL LOW (ref 39.0–51.0)
Hemoglobin: 9.4 g/dL — ABNORMAL LOW (ref 13.0–17.0)
MCV: 78 fL — ABNORMAL LOW (ref 80–100)
Platelets count: 218 10*3/uL (ref 140–400)
RBC's, nucleated: 0 /100{WBCs} (ref <=0)
RDW, RBC: 19.9 % — ABNORMAL HIGH (ref 12.0–16.5)
Red blood cells count: 3.86 M/uL — ABNORMAL LOW (ref 4.10–5.70)
WBC Count: 9.1 10*3/uL (ref 3.7–11.1)

## 2020-07-18 LAB — HEMOGLOBIN AND HEMATOCRIT (OUTSIDE ORGANIZATION)
Hematocrit: 29.3 % — ABNORMAL LOW (ref 39.0–51.0)
Hemoglobin: 9.1 g/dL — ABNORMAL LOW (ref 13.0–17.0)

## 2020-07-18 LAB — PHOSPHORUS (PO4): Phosphorus (PO4): 3.9 mg/dL (ref 2.7–4.5)

## 2020-07-18 LAB — THYROID STIMULATING HORMONE: Thyroid Stimulating Hormone: 1.6 u[IU]/mL (ref 0.4–4.5)

## 2020-07-18 LAB — MAGNESIUM (MG): Magnesium (Mg): 1.8 mg/dL (ref 1.7–2.3)

## 2020-07-20 ENCOUNTER — Other Ambulatory Visit (HOSPITAL_COMMUNITY): Payer: Self-pay | Admitting: Family Medicine

## 2020-07-20 MED FILL — ESCITALOPRAM 20 MG TABLET: 20 | 90 days supply | Qty: 90 | Fill #0

## 2020-07-27 DIAGNOSIS — L821 Other seborrheic keratosis: Secondary | ICD-10-CM | POA: Diagnosis not present

## 2020-07-27 DIAGNOSIS — L918 Other hypertrophic disorders of the skin: Secondary | ICD-10-CM | POA: Diagnosis not present

## 2020-07-27 DIAGNOSIS — D225 Melanocytic nevi of trunk: Secondary | ICD-10-CM | POA: Diagnosis not present

## 2020-07-27 DIAGNOSIS — D485 Neoplasm of uncertain behavior of skin: Secondary | ICD-10-CM | POA: Diagnosis not present

## 2020-07-30 LAB — CHEM 7 (NA, K, CL, CO2, BUN, GLUC, CR) (OUTSIDE ORGANIZATION)
Anion Gap: 7 meq/L (ref 5–16)
CO2: 30 meq/L (ref 24–33)
Chloride: 104 meq/L (ref 100–111)
Creatinine Blood: 3.98 mg/dL — ABNORMAL HIGH (ref <=1.34)
Glomerular Filtration Rate - African American: 17 mL/min — ABNORMAL LOW (ref >=60)
Glomerular filtration rate, nonAfrican American: 15 mL/min — ABNORMAL LOW (ref >=60)
Glucose: 133 mg/dL (ref 60–159)
Potassium: 5.4 meq/L — ABNORMAL HIGH (ref 3.5–5.3)
Sodium: 141 meq/L (ref 135–145)
Urea Nitrogen, Blood (BUN): 21 mg/dL (ref 7–27)

## 2020-08-04 LAB — CHEM 7 (NA, K, CL, CO2, BUN, GLUC, CR) (OUTSIDE ORGANIZATION)
Anion Gap: 8 meq/L (ref 5–16)
CO2: 29 meq/L (ref 24–33)
Chloride: 108 meq/L (ref 100–111)
Creatinine Blood: 4.56 mg/dL — ABNORMAL HIGH (ref <=1.34)
Glomerular Filtration Rate - African American: 15 mL/min — ABNORMAL LOW (ref >=60)
Glomerular filtration rate, nonAfrican American: 13 mL/min — ABNORMAL LOW (ref >=60)
Glucose: 131 mg/dL (ref 60–159)
Potassium: 3.8 meq/L (ref 3.5–5.3)
Sodium: 145 meq/L (ref 135–145)
Urea Nitrogen, Blood (BUN): 29 mg/dL — ABNORMAL HIGH (ref 7–27)

## 2020-08-04 MED FILL — OMEGA-3 ETHYL ESTERS 1 GM C: 1 | 90 days supply | Qty: 360 | Fill #1

## 2020-08-04 MED FILL — FENOFIBRATE 160 MG TABLET: 160 | 90 days supply | Qty: 90 | Fill #2

## 2020-08-06 MED FILL — ROSUVASTATIN CALCIUM 40 MG: 40 | 90 days supply | Qty: 90 | Fill #1

## 2020-08-11 LAB — CHEM 7 (NA, K, CL, CO2, BUN, GLUC, CR) (OUTSIDE ORGANIZATION)
Anion Gap: 8 meq/L (ref 5–16)
CO2: 29 meq/L (ref 24–33)
Chloride: 107 meq/L (ref 100–111)
Creatinine Blood: 4.32 mg/dL — ABNORMAL HIGH (ref <=1.34)
Glomerular Filtration Rate - African American: 16 mL/min — ABNORMAL LOW (ref >=60)
Glomerular filtration rate, nonAfrican American: 14 mL/min — ABNORMAL LOW (ref >=60)
Glucose: 139 mg/dL (ref 60–159)
Potassium: 4 meq/L (ref 3.5–5.3)
Sodium: 144 meq/L (ref 135–145)
Urea Nitrogen, Blood (BUN): 29 mg/dL — ABNORMAL HIGH (ref 7–27)

## 2020-08-18 ENCOUNTER — Other Ambulatory Visit: Payer: Self-pay

## 2020-08-18 LAB — BASIC METABOLIC PANEL
Calcium: 8.4 mg/dL — ABNORMAL LOW (ref 8.6–10.5)
Carbon Dioxide Total: 24 mmol/L (ref 24–32)
Chloride: 107 mmol/L (ref 95–110)
Creatinine Serum: 4.53 mg/dL — ABNORMAL HIGH (ref 0.44–1.27)
E-GFR Creatinine (Male): 13 mL/min/{1.73_m2}
Glucose: 116 mg/dL — ABNORMAL HIGH (ref 70–99)
Potassium: 4.1 mmol/L (ref 3.3–5.0)
Sodium: 143 mmol/L (ref 135–145)
Urea Nitrogen, Blood (BUN): 23 mg/dL — ABNORMAL HIGH (ref 8–22)

## 2020-08-19 LAB — CBC NO DIFFERENTIAL
Hematocrit: 29.4 % — ABNORMAL LOW (ref 39.0–51.0)
Hemoglobin: 8.7 g/dL — ABNORMAL LOW (ref 13.0–17.0)
MCV: 79 fL — ABNORMAL LOW (ref 80–100)
Platelets count: 247 10*3/uL (ref 140–400)
RBC's, nucleated: 0 /100{WBCs} (ref <=0)
RDW, RBC: 18.4 % — ABNORMAL HIGH (ref 12.0–16.5)
Red blood cells count: 3.74 M/uL — ABNORMAL LOW (ref 4.10–5.70)
WBC Count: 8.1 10*3/uL (ref 3.7–11.1)

## 2020-08-19 LAB — BILIRUBIN, TOTAL AND DIRECT (OUTSIDE ORGANIZATION)
Bilirubin Direct: 0.1 mg/dL (ref 0.0–0.3)
Bilirubin Total: 0.3 mg/dL (ref 0.2–1.2)

## 2020-08-19 LAB — ALANINE TRANSFERASE (ALT): Alanine Transferase (ALT): 8 U/L (ref 0–47)

## 2020-08-19 LAB — CREATININE/E-GFR
Creatinine Blood: 2.06 mg/dL — ABNORMAL HIGH (ref <=1.34)
Glomerular Filtration Rate - African American: 39 mL/min — ABNORMAL LOW (ref >=60)
Glomerular filtration rate, nonAfrican American: 33 mL/min — ABNORMAL LOW (ref >=60)

## 2020-08-19 LAB — HEPATITIS C VIRUS RNA, QUANTITATIVE, PCR (OUTSIDE ORGANIZATION)
HCV Quant by NAAT (IU/mL): 660000 [IU]/mL — ABNORMAL HIGH (ref <=14)
HCV RNA Interpretation: DETECTED — AB
HEP C VIRUS RNA VIRAL LOAD PCR: 5.82 {Log}

## 2020-08-19 LAB — AFP CANCER MARKER: Alpha Fetoprotein, Total: 2.7 ng/mL (ref 0.0–8.3)

## 2020-08-19 LAB — INR
INR: 1 Ratio
Prothrombin Time: 12.4 s (ref 11.7–14.9)

## 2020-08-19 LAB — ALKALINE PHOSPHATASE (ALP): Alkaline Phosphatase (ALP): 60 U/L (ref 37–117)

## 2020-08-19 LAB — ALBUMIN: Albumin: 3.7 g/dL (ref 3.7–5.7)

## 2020-08-19 LAB — SODIUM: Sodium: 143 meq/L (ref 135–145)

## 2020-08-19 LAB — ASPARTATE TRANSAMINASE (AST): Aspartate Transaminase (AST): 14 U/L (ref 10–40)

## 2020-08-20 LAB — CHEM 7 (NA, K, CL, CO2, BUN, GLUC, CR) (OUTSIDE ORGANIZATION)
Anion Gap: 6 meq/L (ref 5–16)
CO2: 30 meq/L (ref 24–33)
Chloride: 105 meq/L (ref 100–111)
Creatinine Blood: 4.66 mg/dL — ABNORMAL HIGH (ref <=1.34)
Glomerular Filtration Rate - African American: 14 mL/min — ABNORMAL LOW (ref >=60)
Glomerular filtration rate, nonAfrican American: 12 mL/min — ABNORMAL LOW (ref >=60)
Glucose: 126 mg/dL (ref 60–159)
Potassium: 4.3 meq/L (ref 3.5–5.3)
Sodium: 141 meq/L (ref 135–145)
Urea Nitrogen, Blood (BUN): 30 mg/dL — ABNORMAL HIGH (ref 7–27)

## 2020-08-23 LAB — HCV GENOTYPE

## 2020-08-25 LAB — CHEM 7 (NA, K, CL, CO2, BUN, GLUC, CR) (OUTSIDE ORGANIZATION)
Anion Gap: 9 meq/L (ref 5–16)
CO2: 30 meq/L (ref 24–33)
Chloride: 103 meq/L (ref 100–111)
Creatinine Blood: 4.67 mg/dL — ABNORMAL HIGH (ref <=1.34)
Glomerular Filtration Rate - African American: 14 mL/min — ABNORMAL LOW (ref >=60)
Glomerular filtration rate, nonAfrican American: 12 mL/min — ABNORMAL LOW (ref >=60)
Glucose: 123 mg/dL (ref 60–159)
Potassium: 4 meq/L (ref 3.5–5.3)
Sodium: 142 meq/L (ref 135–145)
Urea Nitrogen, Blood (BUN): 30 mg/dL — ABNORMAL HIGH (ref 7–27)

## 2020-09-01 LAB — CHEM 7 (NA, K, CL, CO2, BUN, GLUC, CR) (OUTSIDE ORGANIZATION)
Anion Gap: 9 meq/L (ref 5–16)
CO2: 29 meq/L (ref 24–33)
Chloride: 103 meq/L (ref 100–111)
Creatinine Blood: 4.83 mg/dL — ABNORMAL HIGH (ref <=1.34)
Glomerular Filtration Rate - African American: 14 mL/min — ABNORMAL LOW (ref >=60)
Glomerular filtration rate, nonAfrican American: 12 mL/min — ABNORMAL LOW (ref >=60)
Glucose: 104 mg/dL (ref 60–159)
Potassium: 4.3 meq/L (ref 3.5–5.3)
Sodium: 141 meq/L (ref 135–145)
Urea Nitrogen, Blood (BUN): 45 mg/dL — ABNORMAL HIGH (ref 7–27)

## 2020-09-08 LAB — CHEM 7 (NA, K, CL, CO2, BUN, GLUC, CR) (OUTSIDE ORGANIZATION)
Anion Gap: 9 meq/L (ref 5–16)
CO2: 27 meq/L (ref 24–33)
Chloride: 106 meq/L (ref 100–111)
Creatinine Blood: 5.03 mg/dL — ABNORMAL HIGH (ref <=1.34)
Glomerular Filtration Rate - African American: 13 mL/min — ABNORMAL LOW (ref >=60)
Glomerular filtration rate, nonAfrican American: 11 mL/min — ABNORMAL LOW (ref >=60)
Glucose: 126 mg/dL (ref 60–159)
Potassium: 4.4 meq/L (ref 3.5–5.3)
Sodium: 142 meq/L (ref 135–145)
Urea Nitrogen, Blood (BUN): 44 mg/dL — ABNORMAL HIGH (ref 7–27)

## 2020-09-15 LAB — CHEM 7 (NA, K, CL, CO2, BUN, GLUC, CR) (OUTSIDE ORGANIZATION)
Anion Gap: 10 meq/L (ref 5–16)
CO2: 27 meq/L (ref 24–33)
Chloride: 103 meq/L (ref 100–111)
Creatinine Blood: 5.35 mg/dL — ABNORMAL HIGH (ref <=1.34)
Glomerular Filtration Rate - African American: 12 mL/min — ABNORMAL LOW (ref >=60)
Glomerular filtration rate, nonAfrican American: 10 mL/min — ABNORMAL LOW (ref >=60)
Glucose: 115 mg/dL (ref 60–159)
Potassium: 5.1 meq/L (ref 3.5–5.3)
Sodium: 140 meq/L (ref 135–145)
Urea Nitrogen, Blood (BUN): 45 mg/dL — ABNORMAL HIGH (ref 7–27)

## 2020-09-22 LAB — CHEM 7 (NA, K, CL, CO2, BUN, GLUC, CR) (OUTSIDE ORGANIZATION)
Anion Gap: 11 meq/L (ref 5–16)
CO2: 28 meq/L (ref 24–33)
Chloride: 104 meq/L (ref 100–111)
Creatinine Blood: 5.64 mg/dL — ABNORMAL HIGH (ref <=1.34)
Glomerular Filtration Rate - African American: 11 mL/min — ABNORMAL LOW (ref >=60)
Glomerular filtration rate, nonAfrican American: 10 mL/min — ABNORMAL LOW (ref >=60)
Glucose: 106 mg/dL (ref 60–159)
Potassium: 4.1 meq/L (ref 3.5–5.3)
Sodium: 143 meq/L (ref 135–145)
Urea Nitrogen, Blood (BUN): 50 mg/dL — ABNORMAL HIGH (ref 7–27)

## 2020-09-23 DIAGNOSIS — Z20822 Contact with and (suspected) exposure to covid-19: Secondary | ICD-10-CM | POA: Diagnosis not present

## 2020-09-23 DIAGNOSIS — Z03818 Encounter for observation for suspected exposure to other biological agents ruled out: Secondary | ICD-10-CM | POA: Diagnosis not present

## 2020-09-24 MED FILL — CLOPIDOGREL 75 MG TABLET: 75 | 90 days supply | Qty: 90 | Fill #2

## 2020-09-24 MED FILL — AMLODIPINE-OLMESARTAN 5-40: 5-40 | 90 days supply | Qty: 90 | Fill #2

## 2020-09-29 LAB — CHEM 7 (NA, K, CL, CO2, BUN, GLUC, CR) (OUTSIDE ORGANIZATION)
Anion Gap: 12 meq/L (ref 5–16)
CO2: 30 meq/L (ref 24–33)
Chloride: 99 meq/L — ABNORMAL LOW (ref 100–111)
Creatinine Blood: 6.94 mg/dL — ABNORMAL HIGH (ref <=1.34)
Glomerular Filtration Rate - African American: 9 mL/min — ABNORMAL LOW (ref >=60)
Glomerular filtration rate, nonAfrican American: 8 mL/min — ABNORMAL LOW (ref >=60)
Glucose: 114 mg/dL (ref 60–159)
Potassium: 4.6 meq/L (ref 3.5–5.3)
Sodium: 141 meq/L (ref 135–145)
Urea Nitrogen, Blood (BUN): 47 mg/dL — ABNORMAL HIGH (ref 7–27)

## 2020-10-05 ENCOUNTER — Other Ambulatory Visit: Payer: Self-pay

## 2020-10-05 ENCOUNTER — Ambulatory Visit: Payer: 59 | Admitting: Adult Health

## 2020-10-05 ENCOUNTER — Encounter: Payer: Self-pay | Admitting: Adult Health

## 2020-10-05 VITALS — BP 128/80 | Ht 70.0 in | Wt 254.2 lb

## 2020-10-05 DIAGNOSIS — G4733 Obstructive sleep apnea (adult) (pediatric): Secondary | ICD-10-CM

## 2020-10-05 DIAGNOSIS — Z9989 Dependence on other enabling machines and devices: Secondary | ICD-10-CM | POA: Diagnosis not present

## 2020-10-05 NOTE — Patient Instructions (Signed)

## 2020-10-05 NOTE — Progress Notes (Signed)
PATIENT: [name] DOB: [birth year]  REASON FOR VISIT: follow up HISTORY FROM: patient  HISTORY OF PRESENT ILLNESS: Today 10/05/20:  [name] is a 63 year old male with a history of obstructive sleep apnea on CPAP.  His download indicates that he uses his machine 29 out of 30 days for compliance of 97%.  He uses his machine greater than 4 hours each night.  On average he uses his machine 7 hours and 19 minutes.  His residual AHI is 3.8 on 5 to 17 cm of water with EPR 3.  Leak in the 95th percentile is 7.2.  He reports that the CPAP is working well for him.  He returns today for an evaluation.  HISTORY Today 06/25/19:  [name] is a 65 year old male with a history of obstructive sleep apnea on CPAP.  He returns today for follow-up.  His download indicates that he uses machine 28 out of 30 days for compliance of 93%.  He uses machine greater than 4 hours each night.  On average he uses machine 6 hours and 51 minutes.  His residual AHI is 5.5 on 5 to 15 cm of water with EPR 3.  His leak in the 95th percentile is 3.9.  His pressure in the 95th percentile is 12.8.  He reports that the CPAP continues to work well for him.  He denies any new issues.  He joins me today for follow-up.   REVIEW OF SYSTEMS: Out of a complete 14 system review of symptoms, the patient complains only of the following symptoms, and all other reviewed systems are negative.  ESS 9  ALLERGIES: No Known Allergies  HOME MEDICATIONS: Outpatient Medications Prior to Visit  Medication Sig Dispense Refill  . allopurinol (ZYLOPRIM) 300 MG tablet Take 300 mg by mouth daily.    Marland Kitchen amLODipine-olmesartan (AZOR) 5-40 MG per tablet Take 1 tablet by mouth every morning.     . citalopram (CELEXA) 20 MG tablet Take 20 mg by mouth daily.    . clopidogrel (PLAVIX) 75 MG tablet TAKE 1 TABLET EVERY DAY 90 tablet 1  . colchicine (COLCRYS) 0.6 MG tablet Take 0.6 mg by mouth daily.    . fenofibrate 160 MG tablet Take 160 mg  by mouth daily.    . rosuvastatin (CRESTOR) 40 MG tablet Take by mouth.    . Suvorexant 20 MG TABS Take by mouth.     No facility-administered medications prior to visit.    PAST MEDICAL HISTORY: Past Medical History:  Diagnosis Date  . Arthritis   . Depression   . High cholesterol   . Hypertension   . Renal cell carcinoma   . Seizures (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 Dr Einar Gip stopped med, no seizures since  . Sleep apnea    CPAP every night  . Stroke ([place]) 11/03/2011   no deficits    PAST SURGICAL HISTORY: Past Surgical History:  Procedure Laterality Date  . ANTERIOR CRUCIATE LIGAMENT REPAIR    . BICEPS TENDON REPAIR    . CARPAL TUNNEL RELEASE    . CARPAL TUNNEL RELEASE Left 02/26/2016   Procedure: LEFT LIMITED OPEN CARPAL TUNNEL RELEASE;  Surgeon: Roseanne Kaufman, MD;  Location: [facility];  Service: Orthopedics;  Laterality: Left;  . KNEE ARTHROSCOPY    . NEPHRECTOMY    . TOTAL KNEE ARTHROPLASTY  11/16/2012   Procedure: TOTAL KNEE ARTHROPLASTY;  Surgeon: Mcarthur Rossetti, MD;  [facility];  Service: Orthopedics;  Laterality: Left;  Left Total Knee Arthroplasty    FAMILY HISTORY: Family History  Problem Relation Age of Onset  . Coronary artery disease Father     SOCIAL HISTORY: Social History   Socioeconomic History  . Marital status: Married    Spouse name: [name]  . Number of children: 2  . Years of education: College  . Highest education level: Not on file  Occupational History  . Occupation: Psychologist, sport and exercise    Employer: [employer]     Comment: Self Employed  Tobacco Use  . Smoking status: Former Smoker    Years: 2.00    Types: Cigarettes    Quit date: 12/05/1988    Years since quitting: 31.8  . Smokeless tobacco: Never Used  . Tobacco comment: QUIT SMOKING OVER 32 YEAR'S AGO  ( 1970 'S)  Substance and Sexual Activity  . Alcohol use: Yes    Comment: socially  . Drug use: No  . Sexual activity: Yes    Other Topics Concern  . 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   social drinker, never a smoker.    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    Social Determinants of Health   Financial Resource Strain:   . Difficulty of Paying Living Expenses: Not on file  Food Insecurity:   . Worried About Charity fundraiser in the Last Year: Not on file  . Ran Out of Food in the Last Year: Not on file  Transportation Needs:   . Lack of Transportation (Medical): Not on file  . Lack of Transportation (Non-Medical): Not on file  Physical Activity:   . Days of Exercise per Week: Not on file  . Minutes of Exercise per Session: Not on file  Stress:   . Feeling of Stress : Not on file  Social Connections:   . Frequency of Communication with Friends and Family: Not on file  . Frequency of Social Gatherings with Friends and Family: Not on file  . Attends Religious Services: Not on file  . Active Member of Clubs or Organizations: Not on file  . Attends Archivist Meetings: Not on file  . Marital Status: Not on file  Intimate Partner Violence:   . Fear of Current or Ex-Partner: Not on file  . Emotionally Abused: Not on file  . Physically Abused: Not on file  . Sexually Abused: Not on file      PHYSICAL EXAM  Vitals:   10/05/20 1112  BP: 128/80  Weight: 254 lb 3.2 oz (115.3 kg)  Height: 5\' 10"  (1.778 m)   Body mass index is 36.47 kg/m.  Generalized: Well developed, in no acute distress  Chest: Lungs clear to auscultation bilaterally  Neurological examination  Mentation: Alert oriented to time, place, history taking. Follows all commands speech and language fluent Cranial nerve II-XII: Extraocular movements were full, visual field were full on confrontational test Head turning and shoulder shrug   were normal and symmetric. Motor: The motor testing reveals 5 over 5 strength of all 4 extremities. Good symmetric motor tone is noted throughout.  Sensory: Sensory testing is intact to soft touch on all 4 extremities. No evidence of extinction is noted.  Gait and station: Gait is normal.    DIAGNOSTIC DATA (LABS, IMAGING, TESTING) - I reviewed patient records, labs,  notes, testing and imaging myself where available.  Lab Results  Component Value Date   WBC 8.5 11/21/2012   HGB 8.1 (L) 11/21/2012   HCT 23.6 (L) 11/21/2012   MCV 82.8 11/21/2012   PLT 211 11/21/2012      Component Value Date/Time   NA 136 11/21/2012 0350   K 4.2 11/21/2012 0350   CL 103 11/21/2012 0350   CO2 24 11/21/2012 0350   GLUCOSE 98 11/21/2012 0350   BUN 23 11/21/2012 0350   CREATININE 1.55 (H) 11/21/2012 0350   CALCIUM 8.4 11/21/2012 0350   PROT 5.9 (L) 11/20/2012 0415   ALBUMIN 2.7 (L) 11/20/2012 0415   AST 14 11/20/2012 0415   ALT 9 11/20/2012 0415   ALKPHOS 67 11/20/2012 0415   BILITOT 0.3 11/20/2012 0415   GFRNONAA 49 (L) 11/21/2012 0350   GFRAA 57 (L) 11/21/2012 0350   Lab Results  Component Value Date   CHOL 103 11/18/2012   HDL 31 (L) 11/18/2012   LDLCALC 39 11/18/2012   TRIG 166 (H) 11/18/2012   CHOLHDL 3.3 11/18/2012   Lab Results  Component Value Date   HGBA1C 5.4 11/18/2012   No results found for: VITAMINB12 Lab Results  Component Value Date   TSH 3.714 11/20/2012      ASSESSMENT AND PLAN 64 y.o. year old male  has a past medical history of Arthritis, Depression, High cholesterol, Hypertension, Renal cell carcinoma, Seizures ([place]), Sleep apnea, and Stroke ([place]) (11/03/2011). here with:  1. OSA on CPAP  - CPAP compliance excellent - Good treatment of AHI  - Encourage patient to use CPAP nightly and > 4 hours each night - F/U in 1 year or sooner if needed   I spent 20 minutes of face-to-face and non-face-to-face time with patient.  This included previsit chart review,  lab review, study review, order entry, electronic health record documentation, patient education.  Ward Givens, MSN, NP-C 10/05/2020, 11:16 AM [facility] [address] [place], [place] [address] [phone]

## 2020-10-13 LAB — CHEM 7 (NA, K, CL, CO2, BUN, GLUC, CR) (OUTSIDE ORGANIZATION)
Anion Gap: 14 meq/L (ref 5–16)
CO2: 28 meq/L (ref 24–33)
Chloride: 97 meq/L — ABNORMAL LOW (ref 100–111)
Creatinine Blood: 8.04 mg/dL — AB (ref <=1.34)
Glomerular Filtration Rate - African American: 7 mL/min — ABNORMAL LOW (ref >=60)
Glomerular filtration rate, nonAfrican American: 6 mL/min — ABNORMAL LOW (ref >=60)
Glucose: 107 mg/dL (ref 60–159)
Potassium: 4.4 meq/L (ref 3.5–5.3)
Sodium: 139 meq/L (ref 135–145)
Urea Nitrogen, Blood (BUN): 63 mg/dL — ABNORMAL HIGH (ref 7–27)

## 2020-10-16 MED FILL — ALLOPURINOL 300 MG TABS: 300 | 90 days supply | Qty: 90 | Fill #1

## 2020-10-16 MED FILL — ESCITALOPRAM 20 MG TABLET: 20 | 90 days supply | Qty: 90 | Fill #1

## 2020-11-11 LAB — CBC NO DIFFERENTIAL
Hematocrit: 46.8 % (ref 39.0–51.0)
Hemoglobin: 14 g/dL (ref 13.0–17.0)
MCV: 79 fL — ABNORMAL LOW (ref 80–100)
Platelets count: 249 10*3/uL (ref 140–400)
RBC's, nucleated: 0 /100{WBCs} (ref <=0)
RDW, RBC: 19 % — ABNORMAL HIGH (ref 12.0–16.5)
Red blood cells count: 5.95 M/uL — ABNORMAL HIGH (ref 4.10–5.70)
WBC Count: 7.7 10*3/uL (ref 3.7–11.1)

## 2020-11-11 LAB — CREATININE/E-GFR
Creatinine Blood: 7.57 mg/dL — AB (ref <=1.34)
Glomerular Filtration Rate - African American: 8 mL/min — ABNORMAL LOW (ref >=60)
Glomerular filtration rate, nonAfrican American: 7 mL/min — ABNORMAL LOW (ref >=60)

## 2020-11-11 LAB — WBC AUTO DIFF (OUTSIDE ORGANIZATION)
Basophils % Auto: 1 % (ref 0–1)
Eosinophils % Auto: 2 % (ref 0–7)
Immature Granulocytes % Auto: 0 % (ref 0–1)
Lymphocytes % Auto: 32 % (ref 15–47)
Monocytes % Auto: 9 % (ref 5–13)
Neutrophils % Auto: 56 % (ref 42–76)
Neutrophils, Automated Count: 4.3 10*3/uL (ref 1.8–7.9)

## 2020-11-11 LAB — ALANINE TRANSFERASE (ALT): Alanine Transferase (ALT): 7 U/L (ref 0–47)

## 2020-11-11 LAB — ASPARTATE TRANSAMINASE (AST): Aspartate Transaminase (AST): 12 U/L (ref 10–40)

## 2020-11-12 LAB — HEPATITIS C VIRUS RNA, QUANTITATIVE, PCR (OUTSIDE ORGANIZATION): HCV RNA Interpretation: NOT DETECTED

## 2020-11-13 MED FILL — ROSUVASTATIN CALCIUM 40 MG: 40 | 90 days supply | Qty: 90 | Fill #2

## 2020-11-13 MED FILL — FENOFIBRATE 160 MG TABLET: 160 | 90 days supply | Qty: 90 | Fill #3

## 2020-12-01 DIAGNOSIS — J069 Acute upper respiratory infection, unspecified: Secondary | ICD-10-CM | POA: Diagnosis not present

## 2020-12-01 DIAGNOSIS — Z20822 Contact with and (suspected) exposure to covid-19: Secondary | ICD-10-CM | POA: Diagnosis not present

## 2020-12-23 ENCOUNTER — Other Ambulatory Visit (HOSPITAL_COMMUNITY): Payer: Self-pay | Admitting: Family Medicine

## 2020-12-23 MED FILL — AMLODIPINE-OLMESARTAN 5-40: 5-40 | 90 days supply | Qty: 90 | Fill #0

## 2020-12-23 MED FILL — CLOPIDOGREL 75 MG TABLET: 75 | 90 days supply | Qty: 90 | Fill #0

## 2021-01-28 LAB — CBC NO DIFFERENTIAL
Hematocrit: 36.7 % — ABNORMAL LOW (ref 39.0–51.0)
Hemoglobin: 11.3 g/dL — ABNORMAL LOW (ref 13.0–17.0)
MCV: 79 fL — ABNORMAL LOW (ref 80–100)
Platelets count: 218 10*3/uL (ref 140–400)
RBC's, nucleated: 0 /100{WBCs} (ref <=0)
RDW, RBC: 16.3 % (ref 12.0–16.5)
Red blood cells count: 4.65 M/uL (ref 4.10–5.70)
WBC Count: 7.9 10*3/uL (ref 3.7–11.1)

## 2021-01-28 LAB — WBC AUTO DIFF (OUTSIDE ORGANIZATION)
Basophils % Auto: 1 % (ref 0–1)
Eosinophils % Auto: 4 % (ref 0–7)
Immature Granulocytes % Auto: 0 % (ref 0–1)
Lymphocytes % Auto: 36 % (ref 15–47)
Monocytes % Auto: 8 % (ref 5–13)
Neutrophils % Auto: 51 % (ref 42–76)
Neutrophils, Automated Count: 4.1 10*3/uL (ref 1.8–7.9)

## 2021-01-28 LAB — APTT STUDIES: aPTT: 29.3 s (ref 23.8–36.1)

## 2021-01-29 ENCOUNTER — Other Ambulatory Visit (HOSPITAL_COMMUNITY): Payer: Self-pay | Admitting: Family Medicine

## 2021-01-29 LAB — CREATININE/E-GFR
Creatinine Blood: 4.97 mg/dL — ABNORMAL HIGH (ref <=1.34)
Glomerular Filtration Rate - African American: 13 mL/min — ABNORMAL LOW (ref >=60)
Glomerular filtration rate, nonAfrican American: 11 mL/min — ABNORMAL LOW (ref >=60)

## 2021-01-29 LAB — ELECTROLYTE PANEL
CO2: 26 meq/L (ref 24–33)
Chloride: 101 meq/L (ref 100–111)
Potassium: 3.5 meq/L (ref 3.5–5.3)
Sodium: 141 meq/L (ref 135–145)

## 2021-01-29 LAB — INR
INR: 1 Ratio
Prothrombin Time: 12.5 s (ref 11.7–14.9)

## 2021-01-29 LAB — UREA NITROGEN, BLOOD (BUN): Urea Nitrogen, Blood (BUN): 28 mg/dL — ABNORMAL HIGH (ref 7–27)

## 2021-01-29 MED FILL — ESCITALOPRAM 20 MG TABLET: 20 | 90 days supply | Qty: 90 | Fill #0

## 2021-01-29 MED FILL — ALLOPURINOL 300 MG TABS: 300 | 90 days supply | Qty: 90 | Fill #0

## 2021-01-30 LAB — PROTEIN ELECTROPHORESIS,SERUM
Albumin: 3.9 g/dL (ref 3.8–5.0)
Alpha-1-globulin, electrophoresis: 0.4 g/dL (ref 0.2–0.4)
Alpha-2-globulin, electrophoresis: 0.9 g/dL (ref 0.5–1.0)
Beta globulin, electrophoresis: 0.7 g/dL (ref 0.6–1.2)
Gamma globulin, electrophoresis: 1.6 g/dL (ref 0.7–1.8)
Protein: 7.5 g/dL (ref 6.0–7.7)
Protein: 7.5 g/dL (ref 6.0–7.7)

## 2021-02-02 LAB — CHEM 7 (NA, K, CL, CO2, BUN, GLUC, CR) (OUTSIDE ORGANIZATION)
Anion Gap: 13 meq/L (ref 5–16)
CO2: 25 meq/L (ref 24–33)
Chloride: 101 meq/L (ref 100–111)
Creatinine Blood: 8.46 mg/dL — AB (ref <=1.34)
Glomerular Filtration Rate - African American: 7 mL/min — ABNORMAL LOW (ref >=60)
Glomerular filtration rate, nonAfrican American: 6 mL/min — ABNORMAL LOW (ref >=60)
Glucose: 95 mg/dL (ref 60–159)
Potassium: 4.4 meq/L (ref 3.5–5.3)
Sodium: 139 meq/L (ref 135–145)
Urea Nitrogen, Blood (BUN): 65 mg/dL — ABNORMAL HIGH (ref 7–27)

## 2021-02-09 LAB — CHEM 7 (NA, K, CL, CO2, BUN, GLUC, CR) (OUTSIDE ORGANIZATION)
Anion Gap: 10 meq/L (ref 5–16)
CO2: 28 meq/L (ref 24–33)
Chloride: 102 meq/L (ref 100–111)
Creatinine Blood: 7.8 mg/dL — AB (ref <=1.34)
Glomerular Filtration Rate - African American: 8 mL/min — ABNORMAL LOW (ref >=60)
Glomerular filtration rate, nonAfrican American: 7 mL/min — ABNORMAL LOW (ref >=60)
Glucose: 91 mg/dL (ref 60–159)
Potassium: 4.2 meq/L (ref 3.5–5.3)
Sodium: 140 meq/L (ref 135–145)
Urea Nitrogen, Blood (BUN): 50 mg/dL — ABNORMAL HIGH (ref 7–27)

## 2021-02-16 LAB — CHEM 7 (NA, K, CL, CO2, BUN, GLUC, CR) (OUTSIDE ORGANIZATION)
Anion Gap: 12 meq/L (ref 5–16)
CO2: 31 meq/L (ref 24–33)
Chloride: 100 meq/L (ref 100–111)
Creatinine Blood: 8 mg/dL — AB (ref <=1.34)
Glomerular Filtration Rate - African American: 7 mL/min — ABNORMAL LOW (ref >=60)
Glomerular filtration rate, nonAfrican American: 6 mL/min — ABNORMAL LOW (ref >=60)
Glucose: 110 mg/dL (ref 60–159)
Potassium: 3.9 meq/L (ref 3.5–5.3)
Sodium: 143 meq/L (ref 135–145)
Urea Nitrogen, Blood (BUN): 55 mg/dL — ABNORMAL HIGH (ref 7–27)

## 2021-02-16 LAB — HAPPY TOGETHER UNMAPPED RESULTS: COVID-19 Results (Outside Organization): NOT DETECTED

## 2021-03-01 ENCOUNTER — Other Ambulatory Visit (HOSPITAL_COMMUNITY): Payer: Self-pay | Admitting: Family Medicine

## 2021-03-01 MED FILL — OMEGA-3 ETHYL ESTERS 1 GM C: 1 | 90 days supply | Qty: 360 | Fill #0

## 2021-03-01 MED FILL — FENOFIBRATE 160 MG TABLET: 160 | 90 days supply | Qty: 90 | Fill #0

## 2021-03-01 MED FILL — ROSUVASTATIN CALCIUM 40 MG: 40 | 90 days supply | Qty: 90 | Fill #3

## 2021-03-16 DIAGNOSIS — G4733 Obstructive sleep apnea (adult) (pediatric): Secondary | ICD-10-CM | POA: Diagnosis not present

## 2021-03-17 LAB — CBC WITH DIFFERENTIAL
Hematocrit: 34.5 % — ABNORMAL LOW (ref 39.0–51.0)
Hemoglobin: 11 g/dL — ABNORMAL LOW (ref 13.0–17.0)
MCV: 79 fL — ABNORMAL LOW (ref 80–100)
Platelets count: 194 10*3/uL (ref 140–400)
RBC's, nucleated: 0 /100{WBCs} (ref <=0)
RDW, RBC: 15.5 % (ref 12.0–16.5)
Red blood cells count: 4.38 M/uL (ref 4.10–5.70)
WBC Count: 8.7 10*3/uL (ref 3.7–11.1)

## 2021-03-17 LAB — WBC AUTO DIFF (OUTSIDE ORGANIZATION)
Basophils % Auto: 1 % (ref 0–1)
Eosinophils % Auto: 4 % (ref 0–7)
Immature Granulocytes % Auto: 0 % (ref 0–1)
Lymphocytes % Auto: 31 % (ref 15–47)
Monocytes % Auto: 8 % (ref 5–13)
Neutrophils % Auto: 56 % (ref 42–76)
Neutrophils, Automated Count: 4.9 10*3/uL (ref 1.8–7.9)

## 2021-03-17 LAB — INR
INR: 1.1 Ratio
Prothrombin Time: 13.3 s (ref 11.7–14.9)

## 2021-03-18 LAB — ALANINE TRANSFERASE (ALT): Alanine Transferase (ALT): 9 U/L (ref 0–47)

## 2021-03-18 LAB — GLUCOSE: Glucose: 100 mg/dL (ref 60–159)

## 2021-03-18 LAB — CARBON DIOXIDE TOTAL: CO2: 28 meq/L (ref 24–33)

## 2021-03-18 LAB — CHLORIDE: Chloride: 102 meq/L (ref 100–111)

## 2021-03-18 LAB — SODIUM: Sodium: 143 meq/L (ref 135–145)

## 2021-03-18 LAB — ASPARTATE TRANSAMINASE (AST): Aspartate Transaminase (AST): 10 U/L (ref 10–40)

## 2021-03-18 LAB — POTASSIUM: Potassium: 4.2 meq/L (ref 3.5–5.3)

## 2021-03-18 LAB — CREATININE/E-GFR
Creatinine Blood: 5.56 mg/dL — ABNORMAL HIGH (ref <=1.34)
Glomerular Filtration Rate - African American: 12 mL/min — ABNORMAL LOW (ref >=60)
Glomerular filtration rate, nonAfrican American: 10 mL/min — ABNORMAL LOW (ref >=60)

## 2021-03-18 LAB — UREA NITROGEN, BLOOD (BUN): Urea Nitrogen, Blood (BUN): 25 mg/dL (ref 7–27)

## 2021-03-19 LAB — HEPATITIS C VIRUS RNA, QUANTITATIVE, PCR (OUTSIDE ORGANIZATION): HCV RNA Interpretation: NOT DETECTED

## 2021-04-05 ENCOUNTER — Other Ambulatory Visit (HOSPITAL_COMMUNITY): Payer: Self-pay

## 2021-04-05 MED FILL — Clopidogrel Bisulfate Tab 75 MG (Base Equiv): ORAL | 90 days supply | Qty: 90 | Fill #0 | Status: AC

## 2021-04-05 MED FILL — Amlodipine Besylate-Olmesartan Medoxomil Tab 5-40 MG: ORAL | 90 days supply | Qty: 90 | Fill #0 | Status: AC

## 2021-04-08 ENCOUNTER — Other Ambulatory Visit (HOSPITAL_COMMUNITY): Payer: Self-pay

## 2021-04-12 ENCOUNTER — Other Ambulatory Visit (HOSPITAL_COMMUNITY): Payer: Self-pay

## 2021-04-13 ENCOUNTER — Other Ambulatory Visit (HOSPITAL_COMMUNITY): Payer: Self-pay

## 2021-04-14 ENCOUNTER — Other Ambulatory Visit (HOSPITAL_COMMUNITY): Payer: Self-pay

## 2021-04-16 ENCOUNTER — Other Ambulatory Visit (HOSPITAL_COMMUNITY): Payer: Self-pay

## 2021-04-20 ENCOUNTER — Other Ambulatory Visit (HOSPITAL_COMMUNITY): Payer: Self-pay

## 2021-04-21 ENCOUNTER — Other Ambulatory Visit (HOSPITAL_COMMUNITY): Payer: Self-pay

## 2021-04-23 ENCOUNTER — Other Ambulatory Visit (HOSPITAL_COMMUNITY): Payer: Self-pay

## 2021-04-23 LAB — URIC ACID, BLOOD: Uric Acid, Blood: 3.6 mg/dL (ref 2.0–8.5)

## 2021-04-23 LAB — CREATININE/E-GFR
Creatinine Blood: 5.67 mg/dL — ABNORMAL HIGH (ref <=1.34)
Glomerular Filtration Rate - African American: 11 mL/min — ABNORMAL LOW (ref >=60)
Glomerular filtration rate, nonAfrican American: 10 mL/min — ABNORMAL LOW (ref >=60)

## 2021-04-23 LAB — CALCIUM: Calcium: 9.5 mg/dL (ref 8.8–10.5)

## 2021-04-23 LAB — GLUCOSE: Glucose: 83 mg/dL (ref 60–159)

## 2021-04-23 LAB — CBC NO DIFFERENTIAL
Hematocrit: 39.1 % (ref 39.0–51.0)
Hemoglobin: 11.8 g/dL — ABNORMAL LOW (ref 13.0–17.0)
MCV: 82 fL (ref 80–100)
Platelets count: 157 10*3/uL (ref 140–400)
RBC's, nucleated: 0 /100{WBCs} (ref <=0)
RDW, RBC: 15.8 % (ref 12.0–16.5)
Red blood cells count: 4.78 M/uL (ref 4.10–5.70)
WBC Count: 7.1 10*3/uL (ref 3.7–11.1)

## 2021-04-23 LAB — POTASSIUM: Potassium: 4.4 meq/L (ref 3.5–5.3)

## 2021-04-23 LAB — WBC AUTO DIFF (OUTSIDE ORGANIZATION)
Basophils % Auto: 1 % (ref 0–1)
Eosinophils % Auto: 4 % (ref 0–7)
Immature Granulocytes % Auto: 0 % (ref 0–1)
Lymphocytes % Auto: 32 % (ref 15–47)
Monocytes % Auto: 6 % (ref 5–13)
Neutrophils % Auto: 56 % (ref 42–76)
Neutrophils, Automated Count: 3.9 10*3/uL (ref 1.8–7.9)

## 2021-04-23 LAB — BILIRUBIN TOTAL: Bilirubin Total: 0.3 mg/dL (ref 0.2–1.2)

## 2021-04-23 LAB — SODIUM: Sodium: 141 meq/L (ref 135–145)

## 2021-04-23 LAB — ALANINE TRANSFERASE (ALT): Alanine Transferase (ALT): 6 U/L (ref 0–47)

## 2021-04-23 LAB — ALKALINE PHOSPHATASE (ALP): Alkaline Phosphatase (ALP): 57 U/L (ref 37–117)

## 2021-04-23 LAB — TROPONIN I: Troponin I: <0.02 ng/mL (ref 0.00–0.04)

## 2021-04-23 LAB — UREA NITROGEN, BLOOD (BUN): Urea Nitrogen, Blood (BUN): 26 mg/dL (ref 7–27)

## 2021-04-23 LAB — ASPARTATE TRANSAMINASE (AST): Aspartate Transaminase (AST): 10 U/L (ref 10–40)

## 2021-04-23 LAB — CARBON DIOXIDE TOTAL: CO2: 31 meq/L (ref 24–33)

## 2021-04-23 LAB — CHLORIDE: Chloride: 102 meq/L (ref 100–111)

## 2021-04-24 LAB — HEPATITIS B SURFACE ANTIGEN: Hep B surface AG: NEGATIVE

## 2021-04-24 LAB — HEPATITIS B CORE AB, TOTAL: HEP B VIRUS CORE AB: NEGATIVE

## 2021-04-24 LAB — HEPATITIS C AB SCREEN: Hep C AB: POSITIVE — AB

## 2021-04-25 LAB — HEPATITIS C RNA PCR-QUAL(ARUP): HCV Quant by NAAT Interpretation: NOT DETECTED

## 2021-04-27 LAB — PROTEIN ELECTROPHORESIS,SERUM
Albumin: 4.5 g/dL (ref 3.8–5.0)
Alpha-1-globulin, electrophoresis: 0.2 g/dL (ref 0.2–0.4)
Alpha-2-globulin, electrophoresis: 0.8 g/dL (ref 0.5–1.0)
Beta globulin, electrophoresis: 0.7 g/dL (ref 0.6–1.2)
Gamma globulin, electrophoresis: 1.7 g/dL (ref 0.7–1.8)
Protein: 7.9 g/dL — ABNORMAL HIGH (ref 6.0–7.7)
Protein: 7.9 g/dL — ABNORMAL HIGH (ref 6.0–7.7)

## 2021-04-27 LAB — HEMOGLOBIN A1C
Hgb A1C,Glucose Est Avg: 103 mg/dL (ref 85–126)
Hgb A1c %: 5.2 % (ref <=5.6)

## 2021-05-06 ENCOUNTER — Other Ambulatory Visit (HOSPITAL_COMMUNITY): Payer: Self-pay

## 2021-05-06 MED FILL — Escitalopram Oxalate Tab 20 MG (Base Equiv): ORAL | 90 days supply | Qty: 90 | Fill #0 | Status: AC

## 2021-05-07 ENCOUNTER — Other Ambulatory Visit (HOSPITAL_COMMUNITY): Payer: Self-pay

## 2021-05-13 ENCOUNTER — Other Ambulatory Visit (HOSPITAL_COMMUNITY): Payer: Self-pay

## 2021-05-13 DIAGNOSIS — I872 Venous insufficiency (chronic) (peripheral): Secondary | ICD-10-CM | POA: Diagnosis not present

## 2021-05-13 DIAGNOSIS — Z0001 Encounter for general adult medical examination with abnormal findings: Secondary | ICD-10-CM | POA: Diagnosis not present

## 2021-05-13 DIAGNOSIS — I10 Essential (primary) hypertension: Secondary | ICD-10-CM | POA: Diagnosis not present

## 2021-05-13 DIAGNOSIS — E782 Mixed hyperlipidemia: Secondary | ICD-10-CM | POA: Diagnosis not present

## 2021-05-13 DIAGNOSIS — Z0000 Encounter for general adult medical examination without abnormal findings: Secondary | ICD-10-CM | POA: Diagnosis not present

## 2021-05-13 DIAGNOSIS — Z79899 Other long term (current) drug therapy: Secondary | ICD-10-CM | POA: Diagnosis not present

## 2021-05-13 DIAGNOSIS — M1A09X0 Idiopathic chronic gout, multiple sites, without tophus (tophi): Secondary | ICD-10-CM | POA: Diagnosis not present

## 2021-05-13 DIAGNOSIS — Z87891 Personal history of nicotine dependence: Secondary | ICD-10-CM | POA: Diagnosis not present

## 2021-05-13 DIAGNOSIS — Z125 Encounter for screening for malignant neoplasm of prostate: Secondary | ICD-10-CM | POA: Diagnosis not present

## 2021-05-13 DIAGNOSIS — F3342 Major depressive disorder, recurrent, in full remission: Secondary | ICD-10-CM | POA: Diagnosis not present

## 2021-05-13 MED ORDER — ROSUVASTATIN CALCIUM 40 MG PO TABS
40.0000 mg | ORAL_TABLET | Freq: Every day | ORAL | 3 refills | Status: DC
  Filled 2021-05-13: qty 90, 90d supply, fill #0
  Filled 2021-08-17: qty 90, 90d supply, fill #1
  Filled 2021-12-10: qty 90, 90d supply, fill #2
  Filled 2022-03-17: qty 90, 90d supply, fill #3

## 2021-05-13 MED ORDER — TRIAMCINOLONE ACETONIDE 0.1 % EX CREA
TOPICAL_CREAM | CUTANEOUS | 5 refills | Status: DC
  Filled 2021-05-13: qty 80, 30d supply, fill #0
  Filled 2022-04-14: qty 80, 30d supply, fill #1

## 2021-05-13 MED ORDER — ALLOPURINOL 300 MG PO TABS
300.0000 mg | ORAL_TABLET | Freq: Every day | ORAL | 3 refills | Status: DC
  Filled 2021-05-13: qty 90, 90d supply, fill #0
  Filled 2021-08-02: qty 90, 90d supply, fill #1
  Filled 2021-11-05: qty 90, 90d supply, fill #2
  Filled 2022-02-09: qty 90, 90d supply, fill #3

## 2021-05-13 MED ORDER — ESCITALOPRAM OXALATE 20 MG PO TABS
1.0000 | ORAL_TABLET | Freq: Every day | ORAL | 3 refills | Status: DC
  Filled 2021-05-13 – 2021-07-25 (×2): qty 90, 90d supply, fill #0
  Filled 2021-08-02 – 2021-11-05 (×2): qty 90, 90d supply, fill #1
  Filled 2022-02-09: qty 90, 90d supply, fill #2

## 2021-05-14 ENCOUNTER — Other Ambulatory Visit (HOSPITAL_COMMUNITY): Payer: Self-pay

## 2021-05-14 MED FILL — Allopurinol Tab 300 MG: ORAL | 90 days supply | Qty: 90 | Fill #0 | Status: CN

## 2021-06-05 MED FILL — Fenofibrate Tab 160 MG: ORAL | 90 days supply | Qty: 90 | Fill #0 | Status: AC

## 2021-06-08 ENCOUNTER — Other Ambulatory Visit (HOSPITAL_COMMUNITY): Payer: Self-pay

## 2021-06-09 ENCOUNTER — Other Ambulatory Visit (HOSPITAL_COMMUNITY): Payer: Self-pay

## 2021-06-28 ENCOUNTER — Other Ambulatory Visit (HOSPITAL_COMMUNITY): Payer: Self-pay

## 2021-06-28 MED FILL — Clopidogrel Bisulfate Tab 75 MG (Base Equiv): ORAL | 90 days supply | Qty: 90 | Fill #1 | Status: AC

## 2021-06-28 MED FILL — Amlodipine Besylate-Olmesartan Medoxomil Tab 5-40 MG: ORAL | 90 days supply | Qty: 90 | Fill #1 | Status: AC

## 2021-07-26 ENCOUNTER — Other Ambulatory Visit (HOSPITAL_COMMUNITY): Payer: Self-pay

## 2021-08-02 ENCOUNTER — Other Ambulatory Visit (HOSPITAL_COMMUNITY): Payer: Self-pay

## 2021-08-02 MED FILL — Omega-3-acid Ethyl Esters Cap 1 GM: ORAL | 90 days supply | Qty: 360 | Fill #0 | Status: AC

## 2021-08-05 ENCOUNTER — Other Ambulatory Visit (HOSPITAL_COMMUNITY): Payer: Self-pay

## 2021-08-17 ENCOUNTER — Other Ambulatory Visit (HOSPITAL_COMMUNITY): Payer: Self-pay

## 2021-08-25 ENCOUNTER — Emergency Department (HOSPITAL_COMMUNITY): Payer: 59

## 2021-08-25 ENCOUNTER — Other Ambulatory Visit (HOSPITAL_COMMUNITY): Payer: Self-pay

## 2021-08-25 ENCOUNTER — Emergency Department (HOSPITAL_COMMUNITY)
Admission: EM | Admit: 2021-08-25 | Discharge: 2021-08-25 | Disposition: A | Discharge status: Home / Self Care | Payer: 59 | Attending: Emergency Medicine | Admitting: Emergency Medicine

## 2021-08-25 DIAGNOSIS — S0990XA Unspecified injury of head, initial encounter: Secondary | ICD-10-CM

## 2021-08-25 DIAGNOSIS — S0100XA Unspecified open wound of scalp, initial encounter: Secondary | ICD-10-CM | POA: Diagnosis not present

## 2021-08-25 DIAGNOSIS — S0181XA Laceration without foreign body of other part of head, initial encounter: Secondary | ICD-10-CM | POA: Insufficient documentation

## 2021-08-25 DIAGNOSIS — Z7901 Long term (current) use of anticoagulants: Secondary | ICD-10-CM | POA: Insufficient documentation

## 2021-08-25 DIAGNOSIS — Z87891 Personal history of nicotine dependence: Secondary | ICD-10-CM | POA: Insufficient documentation

## 2021-08-25 DIAGNOSIS — S199XXA Unspecified injury of neck, initial encounter: Secondary | ICD-10-CM | POA: Diagnosis not present

## 2021-08-25 DIAGNOSIS — W208XXA Other cause of strike by thrown, projected or falling object, initial encounter: Secondary | ICD-10-CM | POA: Insufficient documentation

## 2021-08-25 DIAGNOSIS — R519 Headache, unspecified: Secondary | ICD-10-CM | POA: Diagnosis not present

## 2021-08-25 DIAGNOSIS — I129 Hypertensive chronic kidney disease with stage 1 through stage 4 chronic kidney disease, or unspecified chronic kidney disease: Secondary | ICD-10-CM | POA: Insufficient documentation

## 2021-08-25 DIAGNOSIS — N182 Chronic kidney disease, stage 2 (mild): Secondary | ICD-10-CM | POA: Diagnosis not present

## 2021-08-25 DIAGNOSIS — Z85528 Personal history of other malignant neoplasm of kidney: Secondary | ICD-10-CM | POA: Insufficient documentation

## 2021-08-25 DIAGNOSIS — Z79899 Other long term (current) drug therapy: Secondary | ICD-10-CM | POA: Diagnosis not present

## 2021-08-25 DIAGNOSIS — Z23 Encounter for immunization: Secondary | ICD-10-CM | POA: Diagnosis not present

## 2021-08-25 DIAGNOSIS — Z96652 Presence of left artificial knee joint: Secondary | ICD-10-CM | POA: Diagnosis not present

## 2021-08-25 DIAGNOSIS — S0101XA Laceration without foreign body of scalp, initial encounter: Secondary | ICD-10-CM

## 2021-08-25 DIAGNOSIS — I10 Essential (primary) hypertension: Secondary | ICD-10-CM | POA: Diagnosis not present

## 2021-08-25 MED ORDER — TRAMADOL HCL 50 MG PO TABS
50.0000 mg | ORAL_TABLET | Freq: Four times a day (QID) | ORAL | 1 refills | Status: DC | PRN
  Filled 2021-08-25: qty 30, 4d supply, fill #0

## 2021-08-25 MED ORDER — LIDOCAINE HCL (PF) 1 % IJ SOLN
INTRAMUSCULAR | Status: AC
  Filled 2021-08-25: qty 30

## 2021-08-25 MED ORDER — TETANUS-DIPHTH-ACELL PERTUSSIS 5-2.5-18.5 LF-MCG/0.5 IM SUSY
0.5000 mL | PREFILLED_SYRINGE | Freq: Once | INTRAMUSCULAR | Status: AC
  Administered 2021-08-25: 0.5 mL via INTRAMUSCULAR
  Filled 2021-08-25: qty 0.5
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
## 2021-08-25 NOTE — ED Notes (Signed)
Pt tolerated lac repair without incident. Given TDAP and d/c instructions. Pt and wife verbalized understanding of d/c instructions, meds, and followup care. No questions. VSS. Pt declined w/c, steady gait to exit with wife.
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
## 2021-08-25 NOTE — Discharge Instructions (Addendum)
Ok to shower  Remove staple is 10 days  Ice pack and tylenol also for pain  Hold tomorrow mornings plavix then resume on Friday
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
## 2021-08-25 NOTE — ED Provider Notes (Deleted)
Patient presented to the emergency department with a complaint of a head injury.  A piece of metal machinery came down onto the patient's head resulting in a laceration.  The patient denied loss of consciousness, dizziness or visual changes.  Patient is on Plavix.  Patient's spouse is a surgical nurse who got in touch with Dr. Ninfa Linden with general surgery.  He came down to evaluate the patient and ultimately provided all care and wound repair.  Please see his note for further information.   Rhae Hammock, PA-C 08/25/21 1342

## 2021-08-31 ENCOUNTER — Other Ambulatory Visit (HOSPITAL_COMMUNITY): Payer: Self-pay

## 2021-08-31 MED FILL — Fenofibrate Tab 160 MG: ORAL | 90 days supply | Qty: 90 | Fill #1 | Status: AC

## 2021-09-24 ENCOUNTER — Other Ambulatory Visit (HOSPITAL_COMMUNITY): Payer: Self-pay

## 2021-09-24 DIAGNOSIS — R197 Diarrhea, unspecified: Secondary | ICD-10-CM | POA: Diagnosis not present

## 2021-09-24 DIAGNOSIS — J40 Bronchitis, not specified as acute or chronic: Secondary | ICD-10-CM | POA: Diagnosis not present

## 2021-09-24 MED ORDER — PROMETHAZINE-DM 6.25-15 MG/5ML PO SYRP
5.0000 mL | ORAL_SOLUTION | Freq: Four times a day (QID) | ORAL | 0 refills | Status: AC | PRN
  Filled 2021-09-24: qty 118, 6d supply, fill #0

## 2021-09-24 MED ORDER — CULTURELLE PO CAPS: ORAL_CAPSULE | ORAL | 0 refills | Status: DC

## 2021-09-24 MED ORDER — AZITHROMYCIN 250 MG PO TABS
ORAL_TABLET | ORAL | 0 refills | Status: DC
  Filled 2021-09-24: qty 6, 5d supply, fill #0

## 2021-09-24 MED ORDER — DICYCLOMINE HCL 20 MG PO TABS
20.0000 mg | ORAL_TABLET | Freq: Four times a day (QID) | ORAL | 0 refills | Status: DC
  Filled 2021-09-24: qty 28, 7d supply, fill #0

## 2021-10-04 NOTE — Progress Notes (Signed)
PATIENT: [name] DOB: Aug 12, 1956  REASON FOR VISIT: follow up HISTORY FROM: patient PRIMARY NEUROLOGIST:   Virtual Visit via Video Note  I connected with [name] on 10/05/21 at 10:30 AM EDT by a video enabled telemedicine application located remotely at [facility] and verified that I am speaking with the correct person using two identifiers who was located at their own home.   I discussed the limitations of evaluation and management by telemedicine and the availability of in person appointments. The patient expressed understanding and agreed to proceed.   PATIENT: [name] DOB: 26-Sep-1956  REASON FOR VISIT: follow up HISTORY FROM: patient  HISTORY OF PRESENT ILLNESS: Today 10/05/21:  [name] is a 65 year old male with a history of obstructive sleep apnea on CPAP.  His download is below.  He reports that the CPAP is working well.  He returns today for an evaluation.    HISTORY 10/05/20:   [name] is a 65 year old male with a history of obstructive sleep apnea on CPAP.  His download indicates that he uses his machine 29 out of 30 days for compliance of 97%.  He uses his machine greater than 4 hours each night.  On average he uses his machine 7 hours and 19 minutes.  His residual AHI is 3.8 on 5 to 17 cm of water with EPR 3.  Leak in the 95th percentile is 7.2.  He reports that the CPAP is working well for him.  He returns today for an evaluation.   REVIEW OF SYSTEMS: Out of a complete 14 system review of symptoms, the patient complains only of the following symptoms, and all other reviewed systems are negative.  ALLERGIES: No Known Allergies  HOME MEDICATIONS: Outpatient Medications Prior to Visit  Medication Sig Dispense Refill   allopurinol (ZYLOPRIM) 300 MG tablet Take 300 mg by mouth daily.     allopurinol (ZYLOPRIM) 300 MG tablet TAKE 1 TABLET BY MOUTH ONCE DAILY AS DIRECTED. 90 tablet 1   allopurinol (ZYLOPRIM)  300 MG tablet TAKE 1 TABLET BY MOUTH ONCE DAILY AS DIRECTED 90 tablet 1   allopurinol (ZYLOPRIM) 300 MG tablet Take 1 tablet (300 mg total) by mouth daily. 90 tablet 3   amLODipine-olmesartan (AZOR) 5-40 MG per tablet Take 1 tablet by mouth every morning.      amLODipine-olmesartan (AZOR) 5-40 MG tablet TAKE 1 TABLET BY MOUTH ONCE DAILY. 90 tablet 2   amLODipine-olmesartan (AZOR) 5-40 MG tablet TAKE 1 TABLET BY MOUTH ONCE DAILY 90 tablet 2   azithromycin (ZITHROMAX) 250 MG tablet Take two tablets on the first day and then one tablet every day after. 6 tablet 0   citalopram (CELEXA) 20 MG tablet Take 20 mg by mouth daily.     clopidogrel (PLAVIX) 75 MG tablet TAKE 1 TABLET EVERY DAY 90 tablet 1   clopidogrel (PLAVIX) 75 MG tablet TAKE 1 TABLET (75 MG TOTAL) BY MOUTH DAILY. 90 tablet 2   colchicine (COLCRYS) 0.6 MG tablet Take 0.6 mg by mouth daily.     dicyclomine (BENTYL) 20 MG tablet Take 1 tablet (20 mg total) by mouth 4 (four) times daily for 7 days. 28 tablet 0   escitalopram (LEXAPRO) 20 MG tablet TAKE 1 TABLET (20 MG TOTAL) BY MOUTH DAILY. 90 tablet 1   escitalopram (LEXAPRO) 20 MG tablet Take 1 tablet (20 mg total) by mouth daily. 90 tablet 3   fenofibrate 160 MG tablet Take 160 mg by mouth daily.  fenofibrate 160 MG tablet TAKE 1 TABLET BY MOUTH ONCE DAILY 90 tablet 3   Lactobacillus Rhamnosus, GG, (CULTURELLE) CAPS Take 1 capsule by mouth daily for 30 days. 30 capsule 0   omega-3 acid ethyl esters (LOVAZA) 1 g capsule TAKE 2 CAPSULES BY MOUTH TWICE A DAY 360 capsule 3   rosuvastatin (CRESTOR) 40 MG tablet Take by mouth.     rosuvastatin (CRESTOR) 40 MG tablet TAKE 1 TABLET BY MOUTH ONCE A DAY 90 tablet 3   rosuvastatin (CRESTOR) 40 MG tablet Take 1 tablet (40 mg total) by mouth daily. 90 tablet 3   traMADol (ULTRAM) 50 MG tablet Take 1-2 tablets (50-100 mg total) by mouth every 6 (six) hours as needed. 30 tablet 1   triamcinolone cream (KENALOG) 0.1 % Apply topically to affected area  twice daily. 80 g 5   No facility-administered medications prior to visit.    PAST MEDICAL HISTORY: Past Medical History:  Diagnosis Date   Arthritis    Depression    High cholesterol    Hypertension    Renal cell carcinoma    Seizures ([place])    had partial seizures after CVA,on Keppra. Dr Einar Gip stopped med, no seizures since   Sleep apnea    CPAP every night   Stroke ([place]) 11/03/2011   no deficits    PAST SURGICAL HISTORY: Past Surgical History:  Procedure Laterality Date   ANTERIOR CRUCIATE LIGAMENT REPAIR     BICEPS TENDON REPAIR     CARPAL TUNNEL RELEASE     CARPAL TUNNEL RELEASE Left 02/26/2016   Procedure: LEFT LIMITED OPEN CARPAL TUNNEL RELEASE;  Surgeon: Roseanne Kaufman, MD;  Location: [facility];  Service: Orthopedics;  Laterality: Left;   KNEE ARTHROSCOPY     NEPHRECTOMY     TOTAL KNEE ARTHROPLASTY  11/16/2012   Procedure: TOTAL KNEE ARTHROPLASTY;  Surgeon: Mcarthur Rossetti, MD;  [facility];  Service: Orthopedics;  Laterality: Left;  Left Total Knee Arthroplasty    FAMILY HISTORY: Family History  Problem Relation Age of Onset   Coronary artery disease Father     SOCIAL HISTORY: Social History   Socioeconomic History   Marital status: Married    Spouse name: [name]   Number of children: 2   Years of education: College   Highest education level: Not on file  Occupational History   Occupation: IT sales professional: [employer]     Comment: Self Employed  Tobacco Use   Smoking status: Former    Years: 2.00    Types: Cigarettes    Quit date: 12/05/1988    Years since quitting: 32.8   Smokeless tobacco: Never   Tobacco comments:    [place] 35 YEAR'S AGO  ( 1970 'S)  Substance and Sexual Activity   Alcohol use: Yes    Comment: socially   Drug use: No   Sexual activity: Yes  Other Topics Concern   Not on file  Social History Narrative   Caucasian male patient, right handed, self employed (farmer), 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   social drinker, never a smoker.    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    Social Determinants of Health   Financial Resource Strain: Not on file  Food Insecurity: Not on file  Transportation Needs: Not on file  Physical Activity: Not on file  Stress: Not on file  Social Connections: Not on file  Intimate Partner Violence: Not on file      PHYSICAL EXAM Generalized: Well developed, in no acute distress   Neurological examination  Mentation: Alert oriented to time, place, history taking. Follows all commands speech and language fluent Cranial nerve II-XII:Extraocular movements were full. Facial symmetry noted.Head turning and shoulder shrug  were normal and symmetric. Reflexes: UTA  DIAGNOSTIC DATA (LABS, IMAGING, TESTING) - I reviewed patient records, labs, notes, testing and imaging myself where available.  Lab Results  Component Value Date   WBC 8.5 11/21/2012   HGB 8.1 (L) 11/21/2012   HCT 23.6 (L) 11/21/2012   MCV 82.8 11/21/2012   PLT 211 11/21/2012      Component Value Date/Time   NA 136 11/21/2012 0350   K 4.2 11/21/2012 0350   CL 103 11/21/2012 0350   CO2 24 11/21/2012 0350   GLUCOSE 98 11/21/2012 0350   BUN 23 11/21/2012 0350   CREATININE 1.55 (H) 11/21/2012 0350   CALCIUM 8.4 11/21/2012 0350   PROT 5.9 (L) 11/20/2012 0415   ALBUMIN 2.7 (L) 11/20/2012 0415   AST 14 11/20/2012 0415   ALT 9 11/20/2012 0415   ALKPHOS 67 11/20/2012 0415   BILITOT 0.3 11/20/2012 0415   GFRNONAA 49 (L) 11/21/2012 0350   GFRAA 57 (L) 11/21/2012 0350   Lab Results  Component Value Date   CHOL 103 11/18/2012   HDL 31 (L) 11/18/2012   LDLCALC 39 11/18/2012   TRIG 166 (H) 11/18/2012   CHOLHDL 3.3 11/18/2012   Lab Results  Component Value Date   HGBA1C 5.4 11/18/2012   No results found for: VITAMINB12 Lab Results  Component Value Date   TSH 3.714  11/20/2012      ASSESSMENT AND PLAN 65 y.o. year old male  has a past medical history of Arthritis, Depression, High cholesterol, Hypertension, Renal cell carcinoma, Seizures ([place]), Sleep apnea, and Stroke ([place]) (11/03/2011). here with:  OSA on CPAP  CPAP compliance excellent Residual AHI is good Encouraged patient to continue using CPAP nightly and > 4 hours each night F/U in 1 year or sooner if needed   Ward Givens, MSN, NP-C 10/05/2021, 10:28 AM [facility] [address] [place], [place] [address] [phone]

## 2021-10-05 ENCOUNTER — Telehealth: Payer: 59 | Admitting: Adult Health

## 2021-10-05 DIAGNOSIS — Z9989 Dependence on other enabling machines and devices: Secondary | ICD-10-CM

## 2021-10-05 DIAGNOSIS — G4733 Obstructive sleep apnea (adult) (pediatric): Secondary | ICD-10-CM | POA: Diagnosis not present

## 2021-10-12 ENCOUNTER — Telehealth: Payer: 59 | Admitting: Adult Health

## 2021-10-14 ENCOUNTER — Other Ambulatory Visit (HOSPITAL_COMMUNITY): Payer: Self-pay

## 2021-10-14 MED ORDER — AMLODIPINE-OLMESARTAN 5-40 MG PO TABS
1.0000 | ORAL_TABLET | Freq: Every day | ORAL | 2 refills | Status: DC
  Filled 2021-10-14: qty 90, 90d supply, fill #0
  Filled 2022-01-22: qty 90, 90d supply, fill #1
  Filled 2022-05-10: qty 90, 90d supply, fill #2

## 2021-10-14 MED ORDER — CLOPIDOGREL BISULFATE 75 MG PO TABS
75.0000 mg | ORAL_TABLET | Freq: Every day | ORAL | 2 refills | Status: DC
  Filled 2021-10-14: qty 90, 90d supply, fill #0
  Filled 2022-01-22: qty 90, 90d supply, fill #1
  Filled 2022-05-10: qty 90, 90d supply, fill #2

## 2021-10-21 ENCOUNTER — Other Ambulatory Visit (HOSPITAL_COMMUNITY): Payer: Self-pay

## 2021-11-05 ENCOUNTER — Other Ambulatory Visit (HOSPITAL_COMMUNITY): Payer: Self-pay

## 2021-11-08 ENCOUNTER — Encounter: Payer: Self-pay | Admitting: Internal Medicine

## 2021-11-08 ENCOUNTER — Telehealth: Payer: Self-pay | Admitting: Internal Medicine

## 2021-11-08 DIAGNOSIS — N186 End stage renal disease: Secondary | ICD-10-CM

## 2021-11-08 NOTE — Telephone Encounter (Signed)
Referral received from: [facility]     BMI: 22.65    Unverified Architectural technologist insurer: [employer]   ID: [id number]

## 2021-11-09 NOTE — Telephone Encounter (Signed)
Tried calling patient regarding referral for kidney transplant evaluation. Verified 3 ids. Pt asked me to call him back tomorrow as he was still at Dialysis when he received my call.

## 2021-11-12 NOTE — Telephone Encounter (Signed)
The following information has been obtained directly from the patient    Demographics    Legal name: Lewi Drost  Mailing address  [address] [place] [address]    Phone numbers  Home: No  Mobile: [phone]    SSN: [id number]  Email: [email or web address]  Race: African American  Ethnicity:   Marital status: Married/Separated  Employment status: working but not able to work as much as he'd like, likely will get on disability  If working, name of employer: [employer]    Emergency contact  Name: Makhai Fulco  Phone #: [phone]  Type: cell  Relation to patient: wife  Notify person if admitted to hospital?: yes  Authorize person to receive letters regarding you?: yes    Is the patient on dialysis: yes  If yes,  Location: [facility]  Phone: [phone] Fax: [phone]  Address:   [place] [address]  Method:   Days & start time: TTS 0930      Doctors    PCP  Name: Veatrice Bourbon of identifying information (If provider not already in patient's chart)  Clinic name, phone number, address, etc:     Nephrologist  Name: de Milbert Coulter, Beulah Gandy, MD  Piece of identifying information (If provider not already in patient's chart)  Clinic name, phone number, address, etc:       Are you using a wheelchair, walker, or scooter?: No      Insurance    Primary InsurerIvar Bury  ID: [id number]  Exact name on insurance card: Clinton Gallant    Secondary Insurer:   ID:   Exact name on insurance card:     If the subscriber is not the patient  Subsciber name: Oney, Folz  Subscriber relation to patient:   Subscriber DOB: 07/18/1966  Subscriber SS:   Subscriber employer: [employer]  Employment Status: full-time      I received verbal approval from this patient to send the transplant education video link to the email address provided by the patient and the email will be encrypted. Patient is aware that ongoing communication regarding status in the transplant program and/or medical  inquiries must be established through our on-line portal, [facility].

## 2021-11-17 NOTE — Telephone Encounter (Addendum)
Referral closed. Patient stated they do not want to proceed with a kidney transplant evaluation.

## 2021-11-17 NOTE — Telephone Encounter (Signed)
Called pt to follow up on pending HHQ. Pt stated that he wants to close referral at this time, but he hopes to re-open when he feels he's ready to move forward with transplant. Advised pt that he can request re-openning referral w/in 1 year's time and if beyond 1 year of referral date, he can request a new referral from his nephrologist. Pt verbalized understanding.

## 2021-11-19 DIAGNOSIS — G4733 Obstructive sleep apnea (adult) (pediatric): Secondary | ICD-10-CM | POA: Diagnosis not present

## 2021-12-10 ENCOUNTER — Other Ambulatory Visit (HOSPITAL_COMMUNITY): Payer: Self-pay

## 2021-12-10 MED FILL — Fenofibrate Tab 160 MG: ORAL | 90 days supply | Qty: 90 | Fill #2 | Status: AC

## 2021-12-14 ENCOUNTER — Other Ambulatory Visit (HOSPITAL_COMMUNITY): Payer: Self-pay

## 2022-01-24 ENCOUNTER — Other Ambulatory Visit (HOSPITAL_COMMUNITY): Payer: Self-pay

## 2022-02-09 ENCOUNTER — Other Ambulatory Visit (HOSPITAL_COMMUNITY): Payer: Self-pay

## 2022-02-21 ENCOUNTER — Other Ambulatory Visit (HOSPITAL_COMMUNITY): Payer: Self-pay

## 2022-03-17 ENCOUNTER — Other Ambulatory Visit (HOSPITAL_COMMUNITY): Payer: Self-pay

## 2022-03-18 ENCOUNTER — Other Ambulatory Visit (HOSPITAL_COMMUNITY): Payer: Self-pay

## 2022-03-21 ENCOUNTER — Other Ambulatory Visit (HOSPITAL_COMMUNITY): Payer: Self-pay

## 2022-03-23 ENCOUNTER — Other Ambulatory Visit (HOSPITAL_COMMUNITY): Payer: Self-pay

## 2022-03-23 MED ORDER — OMEGA-3-ACID ETHYL ESTERS 1 G PO CAPS
2.0000 g | ORAL_CAPSULE | Freq: Two times a day (BID) | ORAL | 3 refills | Status: AC
  Filled 2022-03-23: qty 360, 90d supply, fill #0
  Filled 2022-09-27: qty 360, 90d supply, fill #1

## 2022-03-23 MED ORDER — FENOFIBRATE 160 MG PO TABS
160.0000 mg | ORAL_TABLET | Freq: Every day | ORAL | 3 refills | Status: DC
  Filled 2022-03-23: qty 90, 90d supply, fill #0
  Filled 2022-07-07: qty 90, 90d supply, fill #1
  Filled 2022-09-27: qty 90, 90d supply, fill #2
  Filled 2023-01-22: qty 90, 90d supply, fill #3

## 2022-03-25 ENCOUNTER — Other Ambulatory Visit (HOSPITAL_COMMUNITY): Payer: Self-pay

## 2022-04-14 ENCOUNTER — Other Ambulatory Visit (HOSPITAL_COMMUNITY): Payer: Self-pay

## 2022-05-10 ENCOUNTER — Other Ambulatory Visit (HOSPITAL_COMMUNITY): Payer: Self-pay

## 2022-06-05 ENCOUNTER — Other Ambulatory Visit (HOSPITAL_COMMUNITY): Payer: Self-pay

## 2022-06-06 ENCOUNTER — Other Ambulatory Visit (HOSPITAL_COMMUNITY): Payer: Self-pay

## 2022-06-06 MED ORDER — ALLOPURINOL 300 MG PO TABS
300.0000 mg | ORAL_TABLET | Freq: Every day | ORAL | 3 refills | Status: DC
  Filled 2022-06-06: qty 90, 90d supply, fill #0
  Filled 2022-09-09: qty 90, 90d supply, fill #1
  Filled 2023-01-22: qty 90, 90d supply, fill #2
  Filled 2023-04-19 (×2): qty 90, 90d supply, fill #3

## 2022-06-06 MED ORDER — ESCITALOPRAM OXALATE 20 MG PO TABS
20.0000 mg | ORAL_TABLET | Freq: Every day | ORAL | 3 refills | Status: DC
  Filled 2022-06-06: qty 90, 90d supply, fill #0
  Filled 2022-09-09: qty 90, 90d supply, fill #1

## 2022-06-13 DIAGNOSIS — J449 Chronic obstructive pulmonary disease, unspecified: Secondary | ICD-10-CM | POA: Diagnosis not present

## 2022-06-13 DIAGNOSIS — G4733 Obstructive sleep apnea (adult) (pediatric): Secondary | ICD-10-CM | POA: Diagnosis not present

## 2022-06-21 ENCOUNTER — Other Ambulatory Visit (HOSPITAL_COMMUNITY): Payer: Self-pay

## 2022-06-21 DIAGNOSIS — Z8673 Personal history of transient ischemic attack (TIA), and cerebral infarction without residual deficits: Secondary | ICD-10-CM | POA: Diagnosis not present

## 2022-06-21 DIAGNOSIS — R42 Dizziness and giddiness: Secondary | ICD-10-CM | POA: Diagnosis not present

## 2022-06-21 MED ORDER — MECLIZINE HCL 25 MG PO TABS
25.0000 mg | ORAL_TABLET | Freq: Three times a day (TID) | ORAL | 0 refills | Status: DC | PRN
  Filled 2022-06-21: qty 20, 7d supply, fill #0

## 2022-06-23 ENCOUNTER — Other Ambulatory Visit (HOSPITAL_COMMUNITY): Payer: Self-pay

## 2022-06-24 DIAGNOSIS — E782 Mixed hyperlipidemia: Secondary | ICD-10-CM | POA: Diagnosis not present

## 2022-06-24 DIAGNOSIS — I129 Hypertensive chronic kidney disease with stage 1 through stage 4 chronic kidney disease, or unspecified chronic kidney disease: Secondary | ICD-10-CM | POA: Diagnosis not present

## 2022-06-24 DIAGNOSIS — N183 Chronic kidney disease, stage 3 unspecified: Secondary | ICD-10-CM | POA: Diagnosis not present

## 2022-07-05 DIAGNOSIS — G4733 Obstructive sleep apnea (adult) (pediatric): Secondary | ICD-10-CM | POA: Diagnosis not present

## 2022-07-05 DIAGNOSIS — J449 Chronic obstructive pulmonary disease, unspecified: Secondary | ICD-10-CM | POA: Diagnosis not present

## 2022-07-05 DIAGNOSIS — Z0000 Encounter for general adult medical examination without abnormal findings: Secondary | ICD-10-CM | POA: Diagnosis not present

## 2022-07-05 DIAGNOSIS — Z23 Encounter for immunization: Secondary | ICD-10-CM | POA: Diagnosis not present

## 2022-07-05 DIAGNOSIS — Z789 Other specified health status: Secondary | ICD-10-CM | POA: Diagnosis not present

## 2022-07-05 DIAGNOSIS — Z125 Encounter for screening for malignant neoplasm of prostate: Secondary | ICD-10-CM | POA: Diagnosis not present

## 2022-07-07 ENCOUNTER — Other Ambulatory Visit (HOSPITAL_COMMUNITY): Payer: Self-pay

## 2022-07-07 MED ORDER — ROSUVASTATIN CALCIUM 40 MG PO TABS
40.0000 mg | ORAL_TABLET | Freq: Every day | ORAL | 3 refills | Status: DC
  Filled 2022-07-07: qty 90, 90d supply, fill #0
  Filled 2022-09-27: qty 90, 90d supply, fill #1
  Filled 2023-02-01: qty 90, 90d supply, fill #2
  Filled 2023-05-01: qty 90, 90d supply, fill #3

## 2022-07-08 ENCOUNTER — Other Ambulatory Visit (HOSPITAL_COMMUNITY): Payer: Self-pay

## 2022-07-14 DIAGNOSIS — G4733 Obstructive sleep apnea (adult) (pediatric): Secondary | ICD-10-CM | POA: Diagnosis not present

## 2022-07-14 DIAGNOSIS — J449 Chronic obstructive pulmonary disease, unspecified: Secondary | ICD-10-CM | POA: Diagnosis not present

## 2022-07-28 ENCOUNTER — Other Ambulatory Visit (HOSPITAL_COMMUNITY): Payer: Self-pay

## 2022-07-28 DIAGNOSIS — Z20822 Contact with and (suspected) exposure to covid-19: Secondary | ICD-10-CM | POA: Diagnosis not present

## 2022-07-28 DIAGNOSIS — J101 Influenza due to other identified influenza virus with other respiratory manifestations: Secondary | ICD-10-CM | POA: Diagnosis not present

## 2022-07-28 DIAGNOSIS — Z789 Other specified health status: Secondary | ICD-10-CM | POA: Diagnosis not present

## 2022-07-28 MED ORDER — PSEUDOEPH-BROMPHEN-DM 30-2-10 MG/5ML PO SYRP
ORAL_SOLUTION | ORAL | 0 refills | Status: DC
  Filled 2022-07-28: qty 118, 10d supply, fill #0

## 2022-07-28 MED ORDER — IPRATROPIUM BROMIDE 0.03 % NA SOLN
2.0000 | Freq: Two times a day (BID) | NASAL | 1 refills | Status: DC
  Filled 2022-07-28: qty 30, 43d supply, fill #0

## 2022-07-28 MED ORDER — OSELTAMIVIR PHOSPHATE 30 MG PO CAPS
ORAL_CAPSULE | ORAL | 0 refills | Status: AC
  Filled 2022-07-28: qty 10, 5d supply, fill #0
  Filled 2022-07-28: qty 2, 1d supply, fill #0

## 2022-07-28 MED ORDER — ONDANSETRON 4 MG PO TBDP
4.0000 mg | ORAL_TABLET | Freq: Three times a day (TID) | ORAL | 0 refills | Status: DC | PRN
  Filled 2022-07-28: qty 20, 7d supply, fill #0

## 2022-07-28 MED ORDER — ALBUTEROL SULFATE HFA 108 (90 BASE) MCG/ACT IN AERS
2.0000 | INHALATION_SPRAY | Freq: Four times a day (QID) | RESPIRATORY_TRACT | 3 refills | Status: DC | PRN
  Filled 2022-07-28: qty 6.7, 25d supply, fill #0

## 2022-08-05 DIAGNOSIS — G4733 Obstructive sleep apnea (adult) (pediatric): Secondary | ICD-10-CM | POA: Diagnosis not present

## 2022-08-05 DIAGNOSIS — J449 Chronic obstructive pulmonary disease, unspecified: Secondary | ICD-10-CM | POA: Diagnosis not present

## 2022-08-14 DIAGNOSIS — J449 Chronic obstructive pulmonary disease, unspecified: Secondary | ICD-10-CM | POA: Diagnosis not present

## 2022-08-14 DIAGNOSIS — G4733 Obstructive sleep apnea (adult) (pediatric): Secondary | ICD-10-CM | POA: Diagnosis not present

## 2022-08-16 ENCOUNTER — Other Ambulatory Visit (HOSPITAL_COMMUNITY): Payer: Self-pay

## 2022-08-16 MED ORDER — AMLODIPINE-OLMESARTAN 5-40 MG PO TABS
1.0000 | ORAL_TABLET | Freq: Every day | ORAL | 2 refills | Status: DC
  Filled 2022-08-16: qty 90, 90d supply, fill #0
  Filled 2022-11-24: qty 90, 90d supply, fill #1
  Filled 2023-06-10: qty 90, 90d supply, fill #2

## 2022-08-16 MED ORDER — CLOPIDOGREL BISULFATE 75 MG PO TABS
75.0000 mg | ORAL_TABLET | Freq: Every day | ORAL | 2 refills | Status: DC
  Filled 2022-08-16: qty 90, 90d supply, fill #0
  Filled 2022-11-24: qty 90, 90d supply, fill #1
  Filled 2023-06-10: qty 90, 90d supply, fill #2

## 2022-09-04 DIAGNOSIS — J449 Chronic obstructive pulmonary disease, unspecified: Secondary | ICD-10-CM | POA: Diagnosis not present

## 2022-09-04 DIAGNOSIS — G4733 Obstructive sleep apnea (adult) (pediatric): Secondary | ICD-10-CM | POA: Diagnosis not present

## 2022-09-09 ENCOUNTER — Other Ambulatory Visit (HOSPITAL_COMMUNITY): Payer: Self-pay

## 2022-09-13 DIAGNOSIS — G4733 Obstructive sleep apnea (adult) (pediatric): Secondary | ICD-10-CM | POA: Diagnosis not present

## 2022-09-13 DIAGNOSIS — J449 Chronic obstructive pulmonary disease, unspecified: Secondary | ICD-10-CM | POA: Diagnosis not present

## 2022-09-27 ENCOUNTER — Other Ambulatory Visit (HOSPITAL_COMMUNITY): Payer: Self-pay

## 2022-09-28 ENCOUNTER — Telehealth: Payer: Self-pay | Admitting: Adult Health

## 2022-09-28 NOTE — Telephone Encounter (Signed)
LVM and sent mychart msg informing pt of need to reschedule 10/31 appointment - NP out

## 2022-10-04 ENCOUNTER — Telehealth: Payer: 59 | Admitting: Adult Health

## 2022-10-05 DIAGNOSIS — G4733 Obstructive sleep apnea (adult) (pediatric): Secondary | ICD-10-CM | POA: Diagnosis not present

## 2022-10-05 DIAGNOSIS — J449 Chronic obstructive pulmonary disease, unspecified: Secondary | ICD-10-CM | POA: Diagnosis not present

## 2022-10-14 DIAGNOSIS — J449 Chronic obstructive pulmonary disease, unspecified: Secondary | ICD-10-CM | POA: Diagnosis not present

## 2022-10-14 DIAGNOSIS — G4733 Obstructive sleep apnea (adult) (pediatric): Secondary | ICD-10-CM | POA: Diagnosis not present

## 2022-10-31 ENCOUNTER — Encounter: Payer: Self-pay | Admitting: *Deleted

## 2022-10-31 NOTE — Progress Notes (Unsigned)
PATIENT: [name] DOB: [birth year]  REASON FOR VISIT: follow up HISTORY FROM: patient PRIMARY NEUROLOGIST:   Virtual Visit via Video Note  I connected with [name] on 11/01/22 at  3:00 PM EST by a video enabled telemedicine application located remotely at [facility] and verified that I am speaking with the correct person using two identifiers who was located at their own home.   I discussed the limitations of evaluation and management by telemedicine and the availability of in person appointments. The patient expressed understanding and agreed to proceed.   PATIENT: [name] DOB: Sep 29, 1956  REASON FOR VISIT: follow up HISTORY FROM: patient  HISTORY OF PRESENT ILLNESS: Today 11/01/22:  [name] is a 66 year old male with a history of obstructive sleep apnea on CPAP.  He returns today for follow-up.  He reports that the CPAP is working well for him.  Continues to notice the benefit.  Denies significant daytime sleepiness.  Download is below.     10/05/21: [name] is a 66 year old male with a history of obstructive sleep apnea on CPAP.  His download is below.  He reports that the CPAP is working well.  He returns today for an evaluation.    HISTORY 10/05/20:   [name] is a 66 year old male with a history of obstructive sleep apnea on CPAP.  His download indicates that he uses his machine 29 out of 30 days for compliance of 97%.  He uses his machine greater than 4 hours each night.  On average he uses his machine 7 hours and 19 minutes.  His residual AHI is 3.8 on 5 to 17 cm of water with EPR 3.  Leak in the 95th percentile is 7.2.  He reports that the CPAP is working well for him.  He returns today for an evaluation.   REVIEW OF SYSTEMS: Out of a complete 14 system review of symptoms, the patient complains only of the following symptoms, and all other reviewed systems are negative.  ALLERGIES: No Known  Allergies  HOME MEDICATIONS: Outpatient Medications Prior to Visit  Medication Sig Dispense Refill   albuterol (VENTOLIN HFA) 108 (90 Base) MCG/ACT inhaler Inhale 2 puffs into the lungs every 6 (six) hours as needed for wheezing 6.7 g 3   allopurinol (ZYLOPRIM) 300 MG tablet Take 300 mg by mouth daily.     allopurinol (ZYLOPRIM) 300 MG tablet TAKE 1 TABLET BY MOUTH ONCE DAILY AS DIRECTED. 90 tablet 1   allopurinol (ZYLOPRIM) 300 MG tablet TAKE 1 TABLET BY MOUTH ONCE DAILY AS DIRECTED 90 tablet 1   allopurinol (ZYLOPRIM) 300 MG tablet Take 1 tablet (300 mg total) by mouth daily. 90 tablet 3   amLODipine-olmesartan (AZOR) 5-40 MG per tablet Take 1 tablet by mouth every morning.      amLODipine-olmesartan (AZOR) 5-40 MG tablet TAKE 1 TABLET BY MOUTH ONCE DAILY 90 tablet 2   amLODipine-olmesartan (AZOR) 5-40 MG tablet Take 1 tablet by mouth daily. 90 tablet 2   brompheniramine-pseudoephedrine-DM 30-2-10 MG/5ML syrup Take 5 mLs by mouth every 4 hours for 10 days. 118 mL 0   citalopram (CELEXA) 20 MG tablet Take 20 mg by mouth daily.     clopidogrel (PLAVIX) 75 MG tablet TAKE 1 TABLET EVERY DAY 90 tablet 1   clopidogrel (PLAVIX) 75 MG tablet Take 1 tablet (75 mg total) by mouth daily. 90 tablet 2   colchicine (COLCRYS) 0.6 MG tablet Take 0.6 mg by mouth daily.  dicyclomine (BENTYL) 20 MG tablet Take 1 tablet (20 mg total) by mouth 4 (four) times daily for 7 days. 28 tablet 0   escitalopram (LEXAPRO) 20 MG tablet TAKE 1 TABLET (20 MG TOTAL) BY MOUTH DAILY. 90 tablet 1   escitalopram (LEXAPRO) 20 MG tablet Take 1 tablet (20 mg total) by mouth daily. 90 tablet 3   fenofibrate 160 MG tablet Take 160 mg by mouth daily.     fenofibrate 160 MG tablet TAKE 1 TABLET BY MOUTH ONCE DAILY 90 tablet 3   fenofibrate 160 MG tablet Take 1 tablet (160 mg total) by mouth daily. 90 tablet 3   ipratropium (ATROVENT) 0.03 % nasal spray Place 2 sprays into both nostrils every 12 (twelve) hours. 30 mL 1    Lactobacillus Rhamnosus, GG, (CULTURELLE) CAPS Take 1 capsule by mouth daily for 30 days. 30 capsule 0   meclizine (ANTIVERT) 25 MG tablet Take 1 tablet (25 mg total) by mouth 3 (three) times daily as needed for up to 7 days 20 tablet 0   omega-3 acid ethyl esters (LOVAZA) 1 g capsule TAKE 2 CAPSULES BY MOUTH TWICE A DAY 360 capsule 3   omega-3 acid ethyl esters (LOVAZA) 1 g capsule Take 2 capsules (2 g total) by mouth 2 (two) times daily. 360 capsule 3   ondansetron (ZOFRAN-ODT) 4 MG disintegrating tablet Take 1 tablet (4 mg total) by mouth every 8 (eight) hours as needed for nausea 20 tablet 0   rosuvastatin (CRESTOR) 40 MG tablet Take by mouth.     rosuvastatin (CRESTOR) 40 MG tablet TAKE 1 TABLET BY MOUTH ONCE A DAY 90 tablet 3   rosuvastatin (CRESTOR) 40 MG tablet Take 1 tablet (40 mg total) by mouth daily. 90 tablet 3   traMADol (ULTRAM) 50 MG tablet Take 1-2 tablets (50-100 mg total) by mouth every 6 (six) hours as needed. 30 tablet 1   triamcinolone cream (KENALOG) 0.1 % Apply topically to affected area twice daily. 80 g 5   No facility-administered medications prior to visit.    PAST MEDICAL HISTORY: Past Medical History:  Diagnosis Date   Arthritis    Depression    High cholesterol    Hypertension    Renal cell carcinoma    Seizures (HCC)    had partial seizures after CVA,on Keppra. Dr Jacinto Halim stopped med, no seizures since   Sleep apnea    CPAP every night   Stroke (HCC) 11/03/2011   no deficits    PAST SURGICAL HISTORY: Past Surgical History:  Procedure Laterality Date   ANTERIOR CRUCIATE LIGAMENT REPAIR     BICEPS TENDON REPAIR     CARPAL TUNNEL RELEASE     CARPAL TUNNEL RELEASE Left 02/26/2016   Procedure: LEFT LIMITED OPEN CARPAL TUNNEL RELEASE;  Surgeon: Dominica Severin, MD;  Location: [facility];  Service: Orthopedics;  Laterality: Left;   KNEE ARTHROSCOPY     NEPHRECTOMY     TOTAL KNEE ARTHROPLASTY  11/16/2012   Procedure: TOTAL KNEE ARTHROPLASTY;   Surgeon: Kathryne Hitch, MD;  Location: [facility];  Service: Orthopedics;  Laterality: Left;  Left Total Knee Arthroplasty    FAMILY HISTORY: Family History  Problem Relation Age of Onset   Coronary artery disease Father     SOCIAL HISTORY: Social History   Socioeconomic History   Marital status: Married    Spouse name: Nolon Lennert   Number of children: 2   Years of education: College   Highest education level: Not on  file  Occupational History   Occupation: Office manager: [employer]     Comment: Self Employed  Tobacco Use   Smoking status: Former    Years: 2.00    Types: Cigarettes    Quit date: 12/05/1988    Years since quitting: 33.9   Smokeless tobacco: Never   Tobacco comments:    QUIT SMOKING OVER 35 YEAR'S AGO  ( 1970 'S)  Substance and Sexual Activity   Alcohol use: Yes    Comment: socially   Drug use: No   Sexual activity: Yes  Other Topics Concern   Not on file  Social History Narrative   Caucasian male patient, right handed, self employed (farmer), 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   social drinker, never a smoker.    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    Social Determinants of Health   Financial Resource Strain: Not on file  Food Insecurity: Not on file  Transportation Needs: Not on file  Physical Activity: Not on file  Stress: Not on file  Social Connections: Not on file  Intimate Partner Violence: Not on file      PHYSICAL EXAM Generalized: Well developed, in no acute distress   Neurological examination  Mentation: Alert oriented to time, place, history taking. Follows all commands speech and language fluent Cranial nerve II-XII:Extraocular movements were full. Facial symmetry noted.Head turning and shoulder shrug  were normal and symmetric. Reflexes: UTA  DIAGNOSTIC DATA (LABS, IMAGING, TESTING) - I reviewed patient records, labs,  notes, testing and imaging myself where available.  Lab Results  Component Value Date   WBC 8.5 11/21/2012   HGB 8.1 (L) 11/21/2012   HCT 23.6 (L) 11/21/2012   MCV 82.8 11/21/2012   PLT 211 11/21/2012      Component Value Date/Time   NA 136 11/21/2012 0350   K 4.2 11/21/2012 0350   CL 103 11/21/2012 0350   CO2 24 11/21/2012 0350   GLUCOSE 98 11/21/2012 0350   BUN 23 11/21/2012 0350   CREATININE 1.55 (H) 11/21/2012 0350   CALCIUM 8.4 11/21/2012 0350   PROT 5.9 (L) 11/20/2012 0415   ALBUMIN 2.7 (L) 11/20/2012 0415   AST 14 11/20/2012 0415   ALT 9 11/20/2012 0415   ALKPHOS 67 11/20/2012 0415   BILITOT 0.3 11/20/2012 0415   GFRNONAA 49 (L) 11/21/2012 0350   GFRAA 57 (L) 11/21/2012 0350   Lab Results  Component Value Date   CHOL 103 11/18/2012   HDL 31 (L) 11/18/2012   LDLCALC 39 11/18/2012   TRIG 166 (H) 11/18/2012   CHOLHDL 3.3 11/18/2012   Lab Results  Component Value Date   HGBA1C 5.4 11/18/2012   No results found for: "VITAMINB12" Lab Results  Component Value Date   TSH 3.714 11/20/2012      ASSESSMENT AND PLAN 66 y.o. year old male  has a past medical history of Arthritis, Depression, High cholesterol, Hypertension, Renal cell carcinoma, Seizures (HCC), Sleep apnea, and Stroke (HCC) (11/03/2011). here with:  OSA on CPAP  CPAP compliance excellent Residual AHI is good Encouraged patient to continue using CPAP nightly and > 4 hours each night F/U in 1 year or sooner if needed   Butch Penny, MSN, NP-C 11/01/2022, 2:55 PM [facility] [address] [place], [place] [address] [phone]

## 2022-11-01 ENCOUNTER — Telehealth (INDEPENDENT_AMBULATORY_CARE_PROVIDER_SITE_OTHER): Payer: 59 | Admitting: Adult Health

## 2022-11-01 DIAGNOSIS — G4733 Obstructive sleep apnea (adult) (pediatric): Secondary | ICD-10-CM | POA: Diagnosis not present

## 2022-11-04 DIAGNOSIS — G4733 Obstructive sleep apnea (adult) (pediatric): Secondary | ICD-10-CM | POA: Diagnosis not present

## 2022-11-04 DIAGNOSIS — J449 Chronic obstructive pulmonary disease, unspecified: Secondary | ICD-10-CM | POA: Diagnosis not present

## 2022-11-13 DIAGNOSIS — G4733 Obstructive sleep apnea (adult) (pediatric): Secondary | ICD-10-CM | POA: Diagnosis not present

## 2022-11-13 DIAGNOSIS — J449 Chronic obstructive pulmonary disease, unspecified: Secondary | ICD-10-CM | POA: Diagnosis not present

## 2022-11-16 DIAGNOSIS — G4733 Obstructive sleep apnea (adult) (pediatric): Secondary | ICD-10-CM | POA: Diagnosis not present

## 2022-11-24 ENCOUNTER — Other Ambulatory Visit (HOSPITAL_COMMUNITY): Payer: Self-pay

## 2022-12-21 ENCOUNTER — Other Ambulatory Visit (HOSPITAL_COMMUNITY): Payer: Self-pay

## 2022-12-21 MED ORDER — CLOPIDOGREL BISULFATE 75 MG PO TABS
75.0000 mg | ORAL_TABLET | Freq: Every day | ORAL | 2 refills | Status: AC
  Filled 2022-12-21: qty 90, 90d supply, fill #0

## 2022-12-21 MED ORDER — ROSUVASTATIN CALCIUM 40 MG PO TABS
40.0000 mg | ORAL_TABLET | Freq: Every day | ORAL | 3 refills | Status: AC
  Filled 2022-12-21: qty 90, 90d supply, fill #0

## 2022-12-21 MED ORDER — AMLODIPINE-OLMESARTAN 5-40 MG PO TABS
1.0000 | ORAL_TABLET | Freq: Every day | ORAL | 2 refills | Status: AC
  Filled 2022-12-21: qty 90, 90d supply, fill #0

## 2022-12-21 MED ORDER — FENOFIBRATE 160 MG PO TABS
160.0000 mg | ORAL_TABLET | Freq: Every day | ORAL | 3 refills | Status: DC
  Filled 2022-12-21: qty 90, 90d supply, fill #0

## 2022-12-21 MED ORDER — ESCITALOPRAM OXALATE 20 MG PO TABS
20.0000 mg | ORAL_TABLET | Freq: Every day | ORAL | 3 refills | Status: AC
  Filled 2022-12-21 – 2022-12-30 (×2): qty 90, 90d supply, fill #0
  Filled 2023-04-04 (×2): qty 90, 90d supply, fill #1

## 2022-12-21 MED ORDER — ALLOPURINOL 300 MG PO TABS
300.0000 mg | ORAL_TABLET | Freq: Every day | ORAL | 3 refills | Status: AC
  Filled 2022-12-21: qty 90, 90d supply, fill #0

## 2022-12-22 ENCOUNTER — Other Ambulatory Visit (HOSPITAL_COMMUNITY): Payer: Self-pay

## 2022-12-29 ENCOUNTER — Other Ambulatory Visit (HOSPITAL_COMMUNITY): Payer: Self-pay

## 2022-12-30 ENCOUNTER — Other Ambulatory Visit (HOSPITAL_COMMUNITY): Payer: Self-pay

## 2022-12-30 MED ORDER — TRIAMCINOLONE ACETONIDE 0.1 % EX CREA
TOPICAL_CREAM | Freq: Two times a day (BID) | CUTANEOUS | 5 refills | Status: DC
  Filled 2022-12-30: qty 80, 30d supply, fill #0

## 2023-01-19 ENCOUNTER — Other Ambulatory Visit (HOSPITAL_COMMUNITY): Payer: Self-pay

## 2023-01-23 ENCOUNTER — Other Ambulatory Visit (HOSPITAL_COMMUNITY): Payer: Self-pay

## 2023-02-02 ENCOUNTER — Other Ambulatory Visit (HOSPITAL_COMMUNITY): Payer: Self-pay

## 2023-02-20 ENCOUNTER — Other Ambulatory Visit (HOSPITAL_COMMUNITY): Payer: Self-pay

## 2023-02-20 MED ORDER — METHOCARBAMOL 500 MG PO TABS
500.0000 mg | ORAL_TABLET | Freq: Three times a day (TID) | ORAL | 0 refills | Status: DC
  Filled 2023-02-20: qty 21, 7d supply, fill #0

## 2023-03-03 ENCOUNTER — Other Ambulatory Visit (HOSPITAL_COMMUNITY): Payer: Self-pay

## 2023-04-04 ENCOUNTER — Other Ambulatory Visit (HOSPITAL_COMMUNITY): Payer: Self-pay

## 2023-04-19 ENCOUNTER — Other Ambulatory Visit (HOSPITAL_COMMUNITY): Payer: Self-pay

## 2023-04-20 ENCOUNTER — Other Ambulatory Visit (HOSPITAL_COMMUNITY): Payer: Self-pay

## 2023-05-01 ENCOUNTER — Other Ambulatory Visit (HOSPITAL_COMMUNITY): Payer: Self-pay

## 2023-05-02 ENCOUNTER — Other Ambulatory Visit: Payer: Self-pay

## 2023-05-02 ENCOUNTER — Other Ambulatory Visit (HOSPITAL_COMMUNITY): Payer: Self-pay

## 2023-05-02 MED ORDER — FENOFIBRATE 160 MG PO TABS
160.0000 mg | ORAL_TABLET | Freq: Every day | ORAL | 0 refills | Status: AC
  Filled 2023-05-02: qty 90, 90d supply, fill #0

## 2023-05-03 ENCOUNTER — Other Ambulatory Visit (HOSPITAL_COMMUNITY): Payer: Self-pay

## 2023-10-30 ENCOUNTER — Encounter: Payer: Self-pay | Admitting: *Deleted

## 2023-10-31 ENCOUNTER — Telehealth: Payer: Medicare HMO | Admitting: Adult Health

## 2023-10-31 DIAGNOSIS — G4733 Obstructive sleep apnea (adult) (pediatric): Secondary | ICD-10-CM | POA: Diagnosis not present

## 2023-12-20 ENCOUNTER — Other Ambulatory Visit (HOSPITAL_COMMUNITY): Payer: Self-pay

## 2023-12-25 ENCOUNTER — Other Ambulatory Visit (HOSPITAL_COMMUNITY): Payer: Self-pay

## 2023-12-25 ENCOUNTER — Other Ambulatory Visit (INDEPENDENT_AMBULATORY_CARE_PROVIDER_SITE_OTHER): Payer: Self-pay

## 2023-12-25 ENCOUNTER — Ambulatory Visit (INDEPENDENT_AMBULATORY_CARE_PROVIDER_SITE_OTHER): Payer: Self-pay | Admitting: Physician Assistant

## 2023-12-25 ENCOUNTER — Other Ambulatory Visit: Payer: Self-pay

## 2023-12-25 ENCOUNTER — Encounter: Payer: Self-pay | Admitting: Physician Assistant

## 2023-12-25 DIAGNOSIS — R221 Localized swelling, mass and lump, neck: Secondary | ICD-10-CM

## 2023-12-25 DIAGNOSIS — M542 Cervicalgia: Secondary | ICD-10-CM

## 2023-12-25 DIAGNOSIS — M25511 Pain in right shoulder: Secondary | ICD-10-CM

## 2023-12-25 MED ORDER — ZOSTER VAC RECOMB ADJUVANTED 50 MCG/0.5ML IM SUSR
0.5000 mL | Freq: Once | INTRAMUSCULAR | 0 refills | Status: AC
  Filled 2023-12-25 – 2024-06-19 (×4): qty 0.5, 1d supply, fill #0

## 2023-12-25 MED ORDER — INFLUENZA VAC A&B SURF ANT ADJ 0.5 ML IM SUSY
0.5000 mL | PREFILLED_SYRINGE | Freq: Once | INTRAMUSCULAR | 0 refills | Status: AC
  Filled 2023-12-25: qty 0.5, 1d supply, fill #0

## 2023-12-25 MED ORDER — GABAPENTIN 300 MG PO CAPS
300.0000 mg | ORAL_CAPSULE | Freq: Every day | ORAL | 1 refills | Status: DC
  Filled 2023-12-25: qty 30, 30d supply, fill #0
  Filled 2024-01-07 – 2024-01-08 (×2): qty 30, 30d supply, fill #1

## 2023-12-25 MED ORDER — ZOSTER VAC RECOMB ADJUVANTED 50 MCG/0.5ML IM SUSR
0.5000 mL | Freq: Once | INTRAMUSCULAR | 0 refills | Status: AC
  Filled 2023-12-25 (×2): qty 0.5, 1d supply, fill #0

## 2023-12-25 NOTE — Progress Notes (Signed)
Office Visit Note   Patient: [name]           Date of Birth: 1955-12-26           MRN: [id number] Visit Date: 12/25/2023              Requested by: [place] [address] [place],  [place] [address] PCP: Selina Cooley, MD   Assessment & Plan: Visit Diagnoses:  1. Neck pain   2. Acute pain of right shoulder     Plan: Given the mass right trapezius region recommend MRI with and without contrast to evaluate this.  Will contact radiology to see how the imaging should be ordered to make sure that we include the area of the mass.  Will call patient with the results of the MRI.  In regards to the stabbing sharp pain we will place him on Neurontin 300 mg nightly.  Further recommendations once imaging is complete.  He may benefit from physical therapy for his neck in the future.  Follow-Up Instructions: Return for Pending MRI.   Orders:  Orders Placed This Encounter  Procedures   XR Cervical Spine 2 or 3 views   XR Shoulder 1V Right   Meds ordered this encounter  Medications   gabapentin (NEURONTIN) 300 MG capsule    Sig: Take 1 capsule (300 mg total) by mouth at bedtime.    Dispense:  30 capsule    Refill:  1      Procedures: No procedures performed   Clinical Data: No additional findings.   Subjective: Chief Complaint  Patient presents with   Neck - Pain    HPI [name] comes in today due to neck and right shoulder pain that is been ongoing for the last 2 weeks.  He denies any numbness or tingling down the arm on the right side.  Pain is mostly about the neck and in the trapezius region on the right side.  Looking up does trigger the pain.  He has noticed a knot that he does not feel or is there 2 weeks ago.  He states that he has tried ice heat and Tylenol.  Ranks his pain to be 7 out of 10 pain at worst.  Describes the pain as stabbing stinging at times. Review of Systems Negative for fevers or chills  Objective: Vital Signs: There were no  vitals taken for this visit.  Physical Exam General: Well-developed well-nourished male no acute distress mood affect appropriate. Psych: Alert and oriented x 3 Ortho Exam Upper extremities 5 out of 5 strength throughout against resistance.  No tenderness medial joint of either scapula.  Right trapezius region near the midline there is palpable mass that could represent lipoma.  Slight tenderness with palpation over this area.  There is no abnormal warmth erythema in the area of the mass. Cervical spine: Full flexion without pain.  Full extension with discomfort.  Specialty Comments:  No specialty comments available.  Imaging: XR Cervical Spine 2 or 3 views Result Date: 12/25/2023 Cervical spine 2 views: Loss of lordotic curvature.  Multiple levels endplate spurring.  Disc space overall well-maintained.  No spondylolisthesis.  XR Shoulder 1V Right Result Date: 12/25/2023 Right shoulder single AP view shows no soft tissue abnormalities.  Right shoulder is well located.  AC joint arthritis.  Inferior osteophyte off the humeral head.    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 Dr Jacinto Halim stopped med, no seizures since   Sleep apnea    CPAP every night   Stroke (HCC) 11/03/2011   no deficits    Family History  Problem Relation Age of Onset   Coronary artery disease Father     Past Surgical History:  Procedure Laterality Date   ANTERIOR CRUCIATE LIGAMENT REPAIR     BICEPS TENDON REPAIR     CARPAL TUNNEL RELEASE     CARPAL TUNNEL RELEASE Left 02/26/2016   Procedure: LEFT LIMITED OPEN CARPAL TUNNEL RELEASE;  Surgeon: Dominica Severin, MD;  Location: [facility];  Service: Orthopedics;  Laterality: Left;   KNEE ARTHROSCOPY     NEPHRECTOMY     TOTAL KNEE ARTHROPLASTY  11/16/2012   Procedure: TOTAL KNEE ARTHROPLASTY;  Surgeon: Kathryne Hitch, MD;  Location: [facility];  Service: Orthopedics;  Laterality: Left;  Left Total Knee Arthroplasty   Social History   Occupational History   Occupation: Office manager: [employer]     Comment: Self Employed  Tobacco Use   Smoking status: Former    Current packs/day: 0.00    Types: Cigarettes    Start date: 12/05/1986    Quit date: 12/05/1988    Years since quitting: 35.0   Smokeless tobacco: Never   Tobacco comments:    QUIT SMOKING OVER 35 YEAR'S AGO  ( 1970 'S)  Substance and Sexual Activity   Alcohol use: Yes    Comment: socially   Drug use: No   Sexual activity: Yes

## 2024-01-04 ENCOUNTER — Ambulatory Visit
Admission: RE | Admit: 2024-01-04 | Discharge: 2024-01-04 | Disposition: A | Discharge status: Home / Self Care | Payer: Medicare HMO | Source: Ambulatory Visit | Attending: Physician Assistant | Admitting: Physician Assistant

## 2024-01-04 ENCOUNTER — Inpatient Hospital Stay: Admission: RE | Admit: 2024-01-04 | Payer: Self-pay | Source: Ambulatory Visit

## 2024-01-04 DIAGNOSIS — M542 Cervicalgia: Secondary | ICD-10-CM

## 2024-01-04 DIAGNOSIS — R221 Localized swelling, mass and lump, neck: Secondary | ICD-10-CM

## 2024-01-04 MED ORDER — GADOPICLENOL 0.5 MMOL/ML IV SOLN
10.0000 mL | Freq: Once | INTRAVENOUS | Status: AC | PRN
  Administered 2024-01-04: 10 mL via INTRAVENOUS

## 2024-01-08 ENCOUNTER — Other Ambulatory Visit (HOSPITAL_COMMUNITY): Payer: Self-pay

## 2024-01-11 ENCOUNTER — Other Ambulatory Visit: Payer: Self-pay | Admitting: Physician Assistant

## 2024-01-11 ENCOUNTER — Other Ambulatory Visit: Payer: Self-pay

## 2024-01-11 DIAGNOSIS — R221 Localized swelling, mass and lump, neck: Secondary | ICD-10-CM

## 2024-01-11 MED ORDER — GABAPENTIN 300 MG PO CAPS: 300.0000 mg | ORAL_CAPSULE | Freq: Three times a day (TID) | ORAL | 1 refills | Status: DC

## 2024-01-12 ENCOUNTER — Other Ambulatory Visit (HOSPITAL_COMMUNITY): Payer: Self-pay

## 2024-01-12 MED ORDER — OZEMPIC (0.25 OR 0.5 MG/DOSE) 2 MG/3ML ~~LOC~~ SOPN
PEN_INJECTOR | SUBCUTANEOUS | 0 refills | Status: DC
  Filled 2024-01-12: qty 6, 84d supply, fill #0
  Filled 2024-01-15: qty 6, 56d supply, fill #0

## 2024-01-15 ENCOUNTER — Other Ambulatory Visit (HOSPITAL_COMMUNITY): Payer: Self-pay

## 2024-01-16 ENCOUNTER — Other Ambulatory Visit: Payer: Self-pay | Admitting: Radiology

## 2024-01-16 ENCOUNTER — Telehealth (INDEPENDENT_AMBULATORY_CARE_PROVIDER_SITE_OTHER): Payer: Self-pay | Admitting: Physician Assistant

## 2024-01-16 DIAGNOSIS — M792 Neuralgia and neuritis, unspecified: Secondary | ICD-10-CM

## 2024-01-16 NOTE — Telephone Encounter (Signed)
Patient's radicular symptoms down the right arm are becoming worse he is having to take increased amounts of Neurontin now taking 3 times daily due to the radicular pain.  Therefore given his radicular pain and neck pain recommend MRI of the cervical spine to rule out HNP as a source of his radicular pain.  He has scheduled to see Dr. Jenne Pane in the near future for the mass in his trapezius region.

## 2024-01-23 ENCOUNTER — Ambulatory Visit
Admission: RE | Admit: 2024-01-23 | Discharge: 2024-01-23 | Disposition: A | Discharge status: Home / Self Care | Payer: Medicare HMO | Source: Ambulatory Visit | Attending: Physician Assistant | Admitting: Physician Assistant

## 2024-01-23 DIAGNOSIS — M792 Neuralgia and neuritis, unspecified: Secondary | ICD-10-CM

## 2024-01-30 ENCOUNTER — Other Ambulatory Visit: Payer: Self-pay

## 2024-01-30 DIAGNOSIS — M792 Neuralgia and neuritis, unspecified: Secondary | ICD-10-CM

## 2024-01-30 DIAGNOSIS — M542 Cervicalgia: Secondary | ICD-10-CM

## 2024-02-05 ENCOUNTER — Ambulatory Visit: Payer: Medicare HMO | Admitting: Physical Medicine and Rehabilitation

## 2024-02-05 ENCOUNTER — Ambulatory Visit: Payer: Medicare HMO | Admitting: Physician Assistant

## 2024-02-05 ENCOUNTER — Encounter: Payer: Self-pay | Admitting: Physical Medicine and Rehabilitation

## 2024-02-05 VITALS — BP 155/90 | HR 57

## 2024-02-05 DIAGNOSIS — M47812 Spondylosis without myelopathy or radiculopathy, cervical region: Secondary | ICD-10-CM

## 2024-02-05 DIAGNOSIS — M25511 Pain in right shoulder: Secondary | ICD-10-CM | POA: Diagnosis not present

## 2024-02-05 DIAGNOSIS — M4802 Spinal stenosis, cervical region: Secondary | ICD-10-CM | POA: Diagnosis not present

## 2024-02-05 DIAGNOSIS — G8929 Other chronic pain: Secondary | ICD-10-CM

## 2024-02-05 DIAGNOSIS — M542 Cervicalgia: Secondary | ICD-10-CM | POA: Diagnosis not present

## 2024-02-05 NOTE — Progress Notes (Unsigned)
 [name] - 68 y.o. male MRN [id number]  Date of birth: Sep 02, 1956  Office Visit Note: Visit Date: 02/05/2024 PCP: Selina Cooley, MD Referred by: Selina Cooley, MD  Subjective: Chief Complaint  Patient presents with   Neck - Pain   HPI: [name] is a 68 y.o. male who comes in today per the request of Richardean Canal, PA for evaluation of chronic right sided neck pain radiating up to occipital region of head. Pain ongoing for over 2 months ago. His pain worsened with activity and movement. He describes pain as sharp and stabbing sensation, currently denies pain today. Some relief of pain with home exercise regimen, rest and use of medications. Some relief of pain with Tylenol and Gabapentin. No history of formal physical therapy. Recent cervical MRI imaging exhibits widespread advanced cervical spine degeneration, multifactorial spinal stenosis from C3-C4 through C6-C7. Up to moderate associated spinal cord mass effect (C3-C4 and C5-C6) but no spinal cord signal abnormality. He also reports issues with mass to right neck/shoulder. He was evaluated by Dr. 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 Patient denies focal weakness, numbness and tingling. No recent trauma or falls.      Review of Systems  Musculoskeletal:  Positive for neck pain.  Neurological:  Negative for tingling, sensory change, focal weakness and weakness.  All other systems reviewed and are negative.  Otherwise per HPI.  Assessment & Plan: Visit Diagnoses: No diagnosis found.   Plan: Findings:  Chronic right sided neck pain radiating up to occipital region of head. Patient is pain free today, he continues with conservative therapies such as home exercise regimen, rest and use of medications. I discussed recent cervical MRI with him today using imaging and spine model.  There is multi level spinal canal stenosis and facet arthropathy. His clinical presentation and exam are complex, differentials include both facet mediated pain and cervical radiculopathy. We discussed treatment plan in detail today, given the resolution of his pain and ability to manage his discomfort at home would hold on any type of interventional spine procedures. Should his pain return, worsen or declare itself as more radicular in nature would recommend diagnostic cervical injection. He was instructed to follow up with ENT as needed for management of lipoma. I do not think there is any connection between lipoma and his right sided neck pain. He does have several findings on recent MRI imaging that could certainly be a pain generator for him. I do think he would benefit from short course of formal physical therapy, he would like to wait on therapy at this time. His exam today is non-focal, good strength noted to bilateral upper extremities. No myelopathic symptoms noted. No red flag symptoms noted upon exam today.     Meds & Orders: No orders of the defined types were placed in this encounter.  No orders of the defined types were placed in this encounter.   Follow-up: Return if symptoms worsen or fail to improve.   Procedures: No procedures performed      Clinical History: MRI CERVICAL SPINE WITHOUT CONTRAST   TECHNIQUE: Multiplanar, multisequence MR imaging of the cervical spine was performed. No intravenous contrast was administered.   COMPARISON:  Cervical spine radiographs 12/25/2023. Cervical spine CT 08/25/2021.   FINDINGS: Alignment: Improved cervical lordosis since the prior CT, stable from the radiographs last  month. No significant spondylolisthesis.   Vertebrae: Normal background bone marrow signal. Widespread degenerative endplate spurring. No marrow edema or evidence of acute osseous abnormality.   Cord: Multilevel degenerative cervical spinal cord mass  effect, detailed below. No spinal cord signal abnormality identified.   Posterior Fossa, vertebral arteries, paraspinal tissues: Cervicomedullary junction is within normal limits. Negative visible posterior fossa. Preserved major vascular flow voids in the bilateral neck. Negative visible neck soft tissues, lung apices.   Disc levels:   C2-C3: Right side facet ankylosis at this level confirmed on prior CT. No stenosis.   C3-C4: Chronic severe disc space loss, vacuum disc here in 2022. Bulky circumferential disc osteophyte complex asymmetric to the left and mild to moderate facet hypertrophy. Mild to moderate spinal stenosis and spinal cord mass effect (series 105, image 8 and series 102, image 7). Severe left and moderate to severe right C4 foraminal stenosis.   C4-C5: Less pronounced chronic disc space loss. Circumferential disc osteophyte complex. Mild to moderate facet hypertrophy. Mild spinal stenosis, mild spinal cord mass effect. Severe left and moderate to severe right C5 foraminal stenosis.   C5-C6: Chronic disc space loss and circumferential disc osteophyte complex. Broad-based posterior component. Mild to moderate facet hypertrophy. Mild to moderate spinal stenosis and spinal cord mass effect (series 102, image 7). Moderate to severe bilateral C6 foraminal stenosis.   C6-C7: Severe chronic disc space loss. Circumferential disc osteophyte complex with a broad-based posterior component. Mild spinal stenosis and ventral cord mass effect. Mild to moderate facet hypertrophy. Severe bilateral C7 foraminal stenosis.   C7-T1: Better preserved disc space. Mild to moderate facet hypertrophy. Mild endplate spurring. Mild spinal stenosis without spinal cord mass effect. No foraminal stenosis.   Upper thoracic spine degeneration with borderline spinal stenosis visible at T1-T2.   IMPRESSION: 1. Widespread advanced cervical spine degeneration superimposed on right side facet  ankylosis at C2-C3. No acute osseous abnormality identified. 2. Multifactorial spinal stenosis from C3-C4 through C6-C7. Up to moderate associated spinal cord mass effect (C3-C4 and C5-C6) but no spinal cord signal abnormality. 3. Associated bilateral moderate to severe degenerative neural foraminal stenosis C4 through C7 nerve levels.     Electronically Signed   By: Odessa Fleming M.D.   On: 01/29/2024 09:09   He reports that he quit smoking about 35 years ago. His smoking use included cigarettes. He started smoking about 37 years ago. He has never used smokeless tobacco. No results for input(s): "HGBA1C", "LABURIC" in the last 8760 hours.  Objective:  VS:  HT:    WT:   BMI:     BP:(!) 155/90  HR:(!) 57bpm  TEMP: ( )  RESP:  Physical Exam Vitals and nursing note reviewed.  HENT:     Head: Normocephalic and atraumatic.     Right Ear: External ear normal.     Left Ear: External ear normal.     Nose: Nose normal.     Mouth/Throat:     Mouth: Mucous membranes are moist.  Eyes:     Extraocular Movements: Extraocular movements intact.  Cardiovascular:     Rate and Rhythm: Normal rate.     Pulses: Normal pulses.  Pulmonary:     Effort: Pulmonary effort is normal.  Abdominal:     General: Abdomen is flat. There is no distension.  Musculoskeletal:        General: Normal range of motion.     Cervical back: Normal range of motion.     Comments: No discomfort noted with  flexion, extension and side-to-side rotation. Patient has good strength in the upper extremities including 5 out of 5 strength in wrist extension, long finger flexion and APB. Shoulder range of motion is full bilaterally without any sign of impingement. There is no atrophy of the hands intrinsically. Sensation intact bilaterally. Negative Hoffman's sign. Negative Spurling's sign. Soft palpable mass noted to right trapezius region. Mass moves easily with palpation.     Skin:    General: Skin is warm and dry.     Capillary  Refill: Capillary refill takes less than 2 seconds.  Neurological:     General: No focal deficit present.     Mental Status: He is alert and oriented to person, place, and time.  Psychiatric:        Mood and Affect: Mood normal.        Behavior: Behavior normal.     Ortho Exam  Imaging: No results found.  Past Medical/Family/Surgical/Social History: Medications & Allergies reviewed per EMR, new medications updated. Patient Active Problem List   Diagnosis Date Noted   Coronary artery calcification 01/07/2014   Abnormal stress test 01/07/2014   Obesity, unspecified 01/07/2014   Generalized ischemic cerebrovascular disease 06/03/2013   ? Acute blood loss anemia 11/20/2012   Orthostasis 11/20/2012   CKD (chronic kidney disease) stage 2, GFR 60-89 ml/min 11/20/2012   Seizure disorder-partial 11/20/2012   Dehydration 11/20/2012   History of CVA (cerebrovascular accident) 11/20/2012   Hyperlipidemia 11/19/2012   Depression 11/19/2012   Arthritis of knee, left- post arthroscopy 11/16/2012   Focal epilepsy with impairment of consciousness (HCC) 11/05/2011   Transient ischemic attack 11/05/2011   Middle cerebral artery stenosis with occlusion 11/03/2011   Past Medical History:  Diagnosis Date   Arthritis    Depression    High cholesterol    Hypertension    Renal cell carcinoma    Seizures (HCC)    had partial seizures after CVA,on Keppra. Dr Jacinto Halim stopped med, no seizures since   Sleep apnea    CPAP every night   Stroke (HCC) 11/03/2011   no deficits   Family History  Problem Relation Age of Onset   Coronary artery disease Father    Past Surgical History:  Procedure Laterality Date   ANTERIOR CRUCIATE LIGAMENT REPAIR     BICEPS TENDON REPAIR     CARPAL TUNNEL RELEASE     CARPAL TUNNEL RELEASE Left 02/26/2016   Procedure: LEFT LIMITED OPEN CARPAL TUNNEL RELEASE;  Surgeon: Dominica Severin, MD;  Location: [facility];  Service: Orthopedics;  Laterality: Left;    KNEE ARTHROSCOPY     NEPHRECTOMY     TOTAL KNEE ARTHROPLASTY  11/16/2012   Procedure: TOTAL KNEE ARTHROPLASTY;  Surgeon: Kathryne Hitch, MD;  Location: [facility];  Service: Orthopedics;  Laterality: Left;  Left Total Knee Arthroplasty   Social History   Occupational History   Occupation: Office manager: [employer]     Comment: Self Employed  Tobacco Use   Smoking status: Former    Current packs/day: 0.00    Types: Cigarettes    Start date: 12/05/1986    Quit date: 12/05/1988    Years since quitting: 35.1   Smokeless tobacco: Never   Tobacco comments:    QUIT SMOKING OVER 27 YEAR'S AGO  ( 1970 'S)  Substance and Sexual Activity   Alcohol use: Yes    Comment: socially   Drug use: No   Sexual activity: Yes

## 2024-02-05 NOTE — Progress Notes (Unsigned)
 Pain Score---1

## 2024-03-30 ENCOUNTER — Other Ambulatory Visit: Payer: Self-pay

## 2024-03-30 LAB — CBC NO DIFFERENTIAL
Hematocrit: 37.9 % — ABNORMAL LOW (ref 41.0–53.0)
Hemoglobin: 12.5 g/dL — ABNORMAL LOW (ref 13.5–17.5)
MCH: 25.5 pg — ABNORMAL LOW (ref 27.0–33.0)
MCHC: 32.9 % (ref 32.0–36.0)
MCV: 77.4 fL — ABNORMAL LOW (ref 80.0–100.0)
MPV: 8.6 fL (ref 6.8–10.0)
Platelet Count: 174 10*3/uL (ref 130–400)
RDW: 16.7 % — ABNORMAL HIGH (ref 0.0–14.7)
Red Blood Cell Count: 4.89 10*6/uL (ref 4.50–5.90)
White Blood Cell Count: 6.2 10*3/uL (ref 4.5–11.0)

## 2024-03-30 LAB — RENAL FUNCTION PANEL
Albumin: 4.4 g/dL (ref 4.0–4.9)
Anion Gap: 14 mmol/L (ref 7–15)
Calcium: 9.3 mg/dL (ref 8.6–10.0)
Carbon Dioxide Total: 23 mmol/L (ref 22–29)
Chloride: 103 mmol/L (ref 98–107)
Creatinine Serum: 8.02 mg/dL — ABNORMAL HIGH (ref 0.51–1.17)
E-GFR Creatinine (Male): 7 mL/min/{1.73_m2}
Glucose: 115 mg/dL — ABNORMAL HIGH (ref 74–109)
Phosphorus (PO4): 4.8 mg/dL — ABNORMAL HIGH (ref 2.5–4.5)
Potassium: 4.8 mmol/L (ref 3.4–5.1)
Sodium: 140 mmol/L (ref 136–145)
Urea Nitrogen, Blood (BUN): 39 mg/dL — ABNORMAL HIGH (ref 6–20)

## 2024-03-30 LAB — UREA NITROGEN, BLOOD (BUN): Urea Nitrogen, Blood (BUN): 12 mg/dL (ref 6–20)

## 2024-03-31 LAB — HEPATITIS B SURFACE ANTIBODY
HBsAb Value: 100.17 m[IU]/mL
HEPATITIS B SURFACE Ab: REACTIVE

## 2024-03-31 LAB — HEPATITIS B SURFACE ANTIGEN: Hepatitis B Surface Antigen: NONREACTIVE

## 2024-04-21 ENCOUNTER — Other Ambulatory Visit: Payer: Self-pay | Admitting: Physician Assistant

## 2024-06-19 ENCOUNTER — Other Ambulatory Visit (HOSPITAL_BASED_OUTPATIENT_CLINIC_OR_DEPARTMENT_OTHER): Payer: Self-pay

## 2024-07-05 DEATH — deceased

## 2024-08-07 ENCOUNTER — Other Ambulatory Visit (HOSPITAL_BASED_OUTPATIENT_CLINIC_OR_DEPARTMENT_OTHER): Payer: Self-pay

## 2024-10-07 ENCOUNTER — Encounter: Payer: Self-pay | Admitting: Radiology

## 2024-10-23 ENCOUNTER — Telehealth: Payer: Self-pay | Admitting: Adult Health

## 2024-10-23 NOTE — Telephone Encounter (Signed)
 LVM and sent mychart msg informing pt of need to reschedule 11/12/24 VV - NP out   If patient calls back you can offer an appt with Megan for 12/10 in the afternoon

## 2024-10-29 ENCOUNTER — Other Ambulatory Visit (HOSPITAL_BASED_OUTPATIENT_CLINIC_OR_DEPARTMENT_OTHER): Payer: Self-pay

## 2024-10-29 MED ORDER — BUDESONIDE-FORMOTEROL FUMARATE 160-4.5 MCG/ACT IN AERO
2.0000 | INHALATION_SPRAY | Freq: Two times a day (BID) | RESPIRATORY_TRACT | 3 refills | Status: AC
  Filled 2024-10-29: qty 10.2, 30d supply, fill #0

## 2024-10-29 MED ORDER — DOXYCYCLINE HYCLATE 100 MG PO CAPS
100.0000 mg | ORAL_CAPSULE | Freq: Two times a day (BID) | ORAL | 0 refills | Status: AC
  Filled 2024-10-29: qty 20, 10d supply, fill #0

## 2024-10-29 MED ORDER — ESOMEPRAZOLE MAGNESIUM 40 MG PO CPDR
40.0000 mg | DELAYED_RELEASE_CAPSULE | Freq: Every day | ORAL | 3 refills | Status: AC
  Filled 2024-10-29: qty 30, 30d supply, fill #0

## 2024-10-29 MED ORDER — BENZONATATE 200 MG PO CAPS
200.0000 mg | ORAL_CAPSULE | Freq: Three times a day (TID) | ORAL | 3 refills | Status: AC | PRN
Start: 2024-10-29 — End: ?
  Filled 2024-10-29: qty 90, 30d supply, fill #0

## 2024-10-29 MED ORDER — FLUTICASONE PROPIONATE 50 MCG/ACT NA SUSP
1.0000 | Freq: Two times a day (BID) | NASAL | 3 refills | Status: AC
  Filled 2024-10-29: qty 16, 30d supply, fill #0

## 2024-11-12 ENCOUNTER — Telehealth: Payer: Medicare HMO | Admitting: Adult Health

## 2024-11-13 ENCOUNTER — Other Ambulatory Visit (HOSPITAL_BASED_OUTPATIENT_CLINIC_OR_DEPARTMENT_OTHER): Payer: Self-pay

## 2024-11-13 MED ORDER — BUDESON-GLYCOPYRROL-FORMOTEROL 160-9-4.8 MCG/ACT IN AERO
2.0000 | INHALATION_SPRAY | Freq: Two times a day (BID) | RESPIRATORY_TRACT | 3 refills | Status: AC
  Filled 2024-11-13: qty 10.7, 30d supply, fill #0

## 2024-11-13 NOTE — Telephone Encounter (Signed)
 Pt appt Is scheduled  and wanted to Know if he can get VV ,   Informed Pt that I will check with Duwaine to see if Pt can get VV due to [employer]

## 2024-11-14 NOTE — Telephone Encounter (Signed)
 Called  Pt  informed VV was okay . Appt Changed

## 2024-11-19 ENCOUNTER — Telehealth: Payer: Self-pay | Admitting: Adult Health

## 2024-11-19 NOTE — Telephone Encounter (Signed)
 Patient called to confirm MyChart Video appointment

## 2024-11-26 NOTE — Progress Notes (Signed)
 SABRA

## 2024-11-27 ENCOUNTER — Telehealth: Admitting: Adult Health

## 2024-11-27 DIAGNOSIS — G4733 Obstructive sleep apnea (adult) (pediatric): Secondary | ICD-10-CM

## 2024-11-27 NOTE — Patient Instructions (Signed)
 Your Plan:  Continue using the CPAP nightly Home sleep test ordered Will discuss Zepbound with Dr. Chalice     Thank you for coming to see us  at [facility]. I hope we have been able to provide you high quality care today.  You may receive a patient satisfaction survey over the next few weeks. We would appreciate your feedback and comments so that we may continue to improve ourselves and the health of our patients.

## 2024-11-27 NOTE — Progress Notes (Signed)
 "    PATIENT: [name] DOB: 08/22/1956  REASON FOR VISIT: follow up HISTORY FROM: patient   Virtual Visit via Video Note  I connected with [name] on 11/27/2024 at  9:30 AM EST by a video enabled telemedicine application located remotely at [facility] and verified that I am speaking with the correct person using two identifiers who was located at their own home in [place]   I discussed the limitations of evaluation and management by telemedicine and the availability of in person appointments. The patient expressed understanding and agreed to proceed.   PATIENT: [name] DOB: [birth year]  REASON FOR VISIT: follow up HISTORY FROM: patient  HISTORY OF PRESENT ILLNESS: Today 11/27/2024:  [name] is a 68 y.o. male with a history of obstructive sleep apnea on CPAP. Returns today for follow-up.  He reports that the CPAP is working well.  His download is below.  Uses the DreamWear mask.  He would like a new machine.  Has not had a sleep study in over 5 years.  He reports that his primary care also manages sleep apnea and they mentioned Zepbound.  However the patient does not want to transfer his care to his primary care provider.     10/31/23: [name] is a 68 y.o. male with a history of OSA on CPAP. Returns today for follow-up.  Reports that CPAP is working well.  Denies any new issues.  He joins me today for virtual visit       [name] is a 68 year old male with a history of obstructive sleep apnea on CPAP.  He returns today for follow-up.  He reports that the CPAP is working well for him.  Continues to notice the benefit.  Denies significant daytime sleepiness.  Download is below.     10/05/21: [name] is a 68 year old male with a history of obstructive sleep apnea on CPAP.  His download is below.  He reports that the CPAP is working well.  He returns today for an evaluation.    HISTORY 10/05/20:   [name] is a 67 year old male with a history of obstructive sleep apnea on CPAP.  His download indicates that he uses his machine 29 out of 30 days for compliance of 97%.  He uses his machine greater than 4 hours each night.  On average he uses his machine 7 hours and 19 minutes.  His residual AHI is 3.8 on 5 to 17 cm of water  with EPR 3.  Leak in the 95th percentile is 7.2.  He reports that the CPAP is working well for him.  He returns today for an evaluation.   REVIEW OF SYSTEMS: Out of a complete 14 system review of symptoms, the patient complains only of the following symptoms, and all other reviewed systems are negative.  ALLERGIES: No Known Allergies  HOME MEDICATIONS: Outpatient Medications Prior to Visit  Medication Sig Dispense Refill   allopurinol  (ZYLOPRIM ) 300 MG tablet Take 1 tablet (300 mg total) by mouth daily. 90 tablet 3   amLODipine -olmesartan  (AZOR ) 5-40 MG tablet Take 1 tablet by mouth daily. 90 tablet 2   benzonatate  (TESSALON ) 200 MG capsule Take 1 capsule (200 mg total) by mouth 3 (three) times daily as needed. 90 capsule 3   budesonide -formoterol  (SYMBICORT ) 160-4.5 MCG/ACT inhaler Inhale 2 puffs into the lungs 2 (two) times daily. 10.2 g 3   budesonide -glycopyrrolate -formoterol  (BREZTRI ) 160-9-4.8 MCG/ACT AERO inhaler Inhale 2 puffs into the lungs  2 (two) times daily. 10.7 g 3   citalopram  (CELEXA ) 20 MG tablet Take 20 mg by mouth daily.     clopidogrel  (PLAVIX ) 75 MG tablet Take 1 tablet (75 mg total) by mouth daily. 90 tablet 2   doxycycline  (VIBRAMYCIN ) 100 MG capsule Take 1 capsule (100 mg total) by mouth 2 (two) times daily. 20 capsule 0   escitalopram  (LEXAPRO ) 20 MG tablet Take 1 tablet (20 mg total) by mouth daily. 90 tablet 3   esomeprazole  (NEXIUM ) 40 MG capsule Take 1 capsule (40 mg total) by mouth daily. ok to use generic per dr mardee nurse 11/25 cma 30 capsule 3   fenofibrate  160 MG tablet Take 1 tablet (160 mg total) by mouth daily. 90 tablet 0    fluticasone  (FLONASE ) 50 MCG/ACT nasal spray Place 1 spray into both nostrils 2 (two) times daily. 16 g 3   gabapentin  (NEURONTIN ) 300 MG capsule TAKE 1 CAPSULE BY MOUTH THREE TIMES A DAY 90 capsule 1   omega-3 acid ethyl esters (LOVAZA ) 1 g capsule Take 2 capsules (2 g total) by mouth 2 (two) times daily. 360 capsule 3   rosuvastatin  (CRESTOR ) 40 MG tablet Take 1 tablet (40 mg total) by mouth daily. 90 tablet 3   No facility-administered medications prior to visit.    PAST MEDICAL HISTORY: Past Medical History:  Diagnosis Date   Arthritis    Depression    High cholesterol    Hypertension    Renal cell carcinoma    Seizures (HCC)    had partial seizures after CVA,on Keppra . Dr Ladona stopped med, no seizures since   Sleep apnea    CPAP every night   Stroke (HCC) 11/03/2011   no deficits    PAST SURGICAL HISTORY: Past Surgical History:  Procedure Laterality Date   ANTERIOR CRUCIATE LIGAMENT REPAIR     BICEPS TENDON REPAIR     CARPAL TUNNEL RELEASE     CARPAL TUNNEL RELEASE Left 02/26/2016   Procedure: LEFT LIMITED OPEN CARPAL TUNNEL RELEASE;  Surgeon: Elsie Mussel, MD;  Location: [facility];  Service: Orthopedics;  Laterality: Left;   KNEE ARTHROSCOPY     NEPHRECTOMY     TOTAL KNEE ARTHROPLASTY  11/16/2012   Procedure: TOTAL KNEE ARTHROPLASTY;  Surgeon: Lonni CINDERELLA Poli, MD;  Location: [facility];  Service: Orthopedics;  Laterality: Left;  Left Total Knee Arthroplasty    FAMILY HISTORY: Family History  Problem Relation Age of Onset   Coronary artery disease Father     SOCIAL HISTORY: Social History   Socioeconomic History   Marital status: Married    Spouse name: Rock Degree   Number of children: 2   Years of education: College   Highest education level: Not on file  Occupational History   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[employer]     Comment: Self Employed  Tobacco Use   Smoking status: Former    Current packs/day: 0.00    Types:  Cigarettes    Start date: 12/05/1986    Quit date: 12/05/1988    Years since quitting: 36.0   Smokeless tobacco: Never   Tobacco comments:    QUIT SMOKING OVER 35 YEAR'S AGO  ( 1970 'S)  Substance and Sexual Activity   Alcohol use: Yes    Comment: socially   Drug use: No   Sexual activity: Yes  Other Topics Concern   Not on file  Social History Narrative   Caucasian male patient, right handed, self  employed (farmer), former education officer, 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   social drinker, never a smoker.    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    Social Drivers of Health   Tobacco Use: Medium Risk (10/18/2024)   Received from [facility]   Patient History    Smoking Tobacco Use: Former    Smokeless Tobacco Use: Never    Passive Exposure: Not on file  Financial Resource Strain: Not on file  Food Insecurity: Low Risk (09/09/2024)   Received from [facility]   Epic    Within the past 12 months, you worried that your food would run out before you got money to buy more: Never true    Within the past 12 months, the food you bought just didn't last and you didn't have money to get more. : Never true  Transportation Needs: No Transportation Needs (09/09/2024)   Received from [facility]    In the past 12 months, has lack of reliable transportation kept you from medical appointments, meetings, work or from getting things needed for daily living? : No  Physical Activity: Not on file  Stress: Not on file  Social Connections: Not on file  Intimate Partner Violence: Not on file  Depression (EYV7-0): Not on file  Alcohol Screen: Not on file  Housing: Low Risk (09/09/2024)   Received from [facility]   Epic    What is your living situation today?: I have a steady place to live    Think about the place you live. Do you have problems with any of the following? Choose all that apply:: None/None on this  list  Utilities: Low Risk (09/09/2024)   Received from [facility]   Utilities    In the past 12 months has the electric, gas, oil, or water  company threatened to shut off services in your home? : No  Health Literacy: Not on file      PHYSICAL EXAM Generalized: Well developed, in no acute distress   Neurological examination  Mentation: Alert oriented to time, place, history taking. Follows all commands speech and language fluent Cranial nerve II-XII: Facial symmetry noted.  DIAGNOSTIC DATA (LABS, IMAGING, TESTING) - I reviewed patient records, labs, notes, testing and imaging myself where available.  Lab Results  Component Value Date   WBC 8.5 11/21/2012   HGB 8.1 (L) 11/21/2012   HCT 23.6 (L) 11/21/2012   MCV 82.8 11/21/2012   PLT 211 11/21/2012      Component Value Date/Time   NA 136 11/21/2012 0350   K 4.2 11/21/2012 0350   CL 103 11/21/2012 0350   CO2 24 11/21/2012 0350   GLUCOSE 98 11/21/2012 0350   BUN 23 11/21/2012 0350   CREATININE 1.55 (H) 11/21/2012 0350   CALCIUM  8.4 11/21/2012 0350   PROT 5.9 (L) 11/20/2012 0415   ALBUMIN 2.7 (L) 11/20/2012 0415   AST 14 11/20/2012 0415   ALT 9 11/20/2012 0415   ALKPHOS 67 11/20/2012 0415   BILITOT 0.3 11/20/2012 0415   GFRNONAA 49 (L) 11/21/2012 0350   GFRAA 57 (L) 11/21/2012 0350   Lab Results  Component Value Date   CHOL 103 11/18/2012   HDL 31 (L) 11/18/2012   LDLCALC 39 11/18/2012   TRIG 166 (H) 11/18/2012   CHOLHDL 3.3 11/18/2012   Lab Results  Component Value Date   HGBA1C 5.4 11/18/2012   No results found for: VITAMINB12 Lab  Results  Component Value Date   TSH 3.714 11/20/2012      ASSESSMENT AND PLAN 68 y.o. year old male  has a past medical history of Arthritis, Depression, High cholesterol, Hypertension, Renal cell carcinoma, Seizures (HCC), Sleep apnea, and Stroke (HCC) (11/03/2011). here with:  OSA on CPAP  CPAP compliance excellent Residual AHI is good Encouraged patient to  continue using CPAP nightly and > 4 hours each night. Repeat home sleep test Will discuss Zepbound with Dr. Chalice F/U in 1 year or sooner if needed   Duwaine Russell, MSN, NP-C 11/27/2024, 8:48 AM [facility] [address], [address] [place], [place] [address] [phone] "

## 2024-12-03 ENCOUNTER — Ambulatory Visit: Admitting: Neurology

## 2024-12-03 DIAGNOSIS — G4733 Obstructive sleep apnea (adult) (pediatric): Secondary | ICD-10-CM

## 2024-12-03 DIAGNOSIS — G40909 Epilepsy, unspecified, not intractable, without status epilepticus: Secondary | ICD-10-CM

## 2024-12-03 DIAGNOSIS — I6602 Occlusion and stenosis of left middle cerebral artery: Secondary | ICD-10-CM

## 2024-12-03 DIAGNOSIS — G459 Transient cerebral ischemic attack, unspecified: Secondary | ICD-10-CM

## 2024-12-12 ENCOUNTER — Telehealth: Payer: Self-pay | Admitting: *Deleted

## 2024-12-12 NOTE — Telephone Encounter (Signed)
 Spoke to patient aware to call PCP to discuss weight loss medication

## 2024-12-12 NOTE — Progress Notes (Unsigned)
" ° ° ° ° ° ° ° ° ° ° °"

## 2024-12-12 NOTE — Telephone Encounter (Signed)
-----   Message from Duwaine Russell, NP sent at 12/12/2024  2:13 PM EST ----- Please let patient know that I did discuss with Dr. Chalice and she prefers that any weight loss medication come from patient's PCP ----- Message ----- From: Chalice Saunas, MD Sent: 12/02/2024   1:19 PM EST To: Duwaine Russell, NP  I will not order Zepbound, but I can help to get it under FDA approved diagnosis for OSA, - its the PCP or endocrinologist or weight loss physician h who orders. This is based on a recommendation by [place]> ----- Message ----- From: Russell Duwaine, NP Sent: 11/27/2024   9:02 AM EST To: Saunas Chalice, MD  Would you be willing for this office to prescribe Zepbound for the patient.  He states that his primary care mentioned that however his insurance will not cover unless it comes from a sleep specialist.  I am repeating a home sleep study as he is due for new machine

## 2024-12-18 ENCOUNTER — Ambulatory Visit: Payer: Self-pay | Admitting: Adult Health

## 2024-12-18 NOTE — Procedures (Signed)
 "      [facility]  [name] 69 year old male 03-Jan-1956   HOME SLEEP TEST REPORT ( by Elene  mail -out device )      [facility] chest-worn sensor measures eight physiological channels,  including blood oxygen  saturation (measured via PPG [photoplethysmography]), EKG-derived heart rate, respiratory effort, chest movement (measured via accelerometer), snoring, body position, and actigraphy.  STUDY DATE:  12-03-3024 Data received :  12-12-2024    ORDERING CLINICIAN:  Duwaine Russell, NP REFERRING CLINICIAN:  Dedra Gores, MD    CLINICAL INFORMATION/HISTORY: last seen 11-27-2024 by MM,NP in a Video visit. [name] is a 69 y.o. male with a history of obstructive sleep apnea on CPAP.  He is followed at [facility] for  over 12 years,  originally after a CVA for which he was seen by Dr Rosemarie in 2012. He reports that the CPAP is working well. His download is below. ( 5-17 cm water  pressure, 3 cm EPR , residual AHI 6.6/h, 95% pressure is 13 cm water  )   ResMed device-  Uses the DreamWear mask.  He would like a new machine.  Has not had a sleep study in over 5 years.   He reports that his primary care also manages sleep apnea and they mentioned Zepbound.  However the patient does not want to transfer his care to his primary care provider.  OSA, High cholesterol, Hypertension, Renal cell carcinoma, Seizures (HCC), Sleep apnea, and Stroke (HCC) (11/03/2011).     Epworth Sleepiness Scale 0= would never doze 1= slight chance of dozing 2= moderate chance of dozing 3= high chance of dozing   Sitting and reading: Watching TV: Sitting inactive in a public place (ex. Theater or meeting): As a passenger in a car for an hour without a break: Lying down to rest in the afternoon: Sitting and talking to someone: Sitting quietly after lunch (no alcohol): In a car, while stopped in traffic:   The patient endorsed the Epworth sleepiness score: X  /24.  FFS at   X/ 63  points   BMI:  35 kg/m , Neck Circumference: X   Sleep Summary:   Total Recording Time (hours, min):  12-08-2024 at 23:42 hours      Total Sleep Time (hours, min):  5 h 30 minutes   Sleep efficiency %;71%  REM sleep captured : 57 minutes                                        Respiratory Indices ( AHI )  by AASM / by CMS criteria of scoring;    Calculated pAHI (per hour):   5.6/h                                               Positional  respiratory activity  / snoring : loud snoring in supine sleep.  Highly fragmented sleep as the patient is reporting poor sleep when not on CPAP ( CPAP dependent).  Oxygen  Saturation during Sleep:   Oxygen  Saturation (%) Mean at  94.4 % with an  O2 Saturation Range (%) between 82.4% and 98.6  %  O2 Saturation time (minutes) <89%:  0 minutes         Pulse Rate during Sleep :   Pulse Mean (bpm)  52 bpm in  regular rhythm,  Pulse Range between 47  bpm and  82  bpm.,                 IMPRESSION:  This HST confirms the presence of mild obstructive sleep apnea and loud snoring , without clinically significant hypoxia.  In conjunction with the patients BMI exceeding 35  it will be in the judgment of his PCP to use weight loss medications.    RECOMMENDATION: given the longstanding use of CPAP and the patients reported clinical benefit,this therapy should be continued: I will ask NP Millikan to write for a new autotitration capable CPAP device with heated humidity  : 5-17 cm water  pressure, 3 cm EPR , residual AHI 6.6/h, 95% pressure is 13 cm water  , on ResMed device-  Uses the DreamWear mask.  Any Patient endorsing a high level of sleepiness should be cautioned not to drive, work at heights, or operate dangerous machinery or heavy equipment when tired or sleepy.  Review of good sleep hygiene measures took place in the initial consultation but should be revisited ( Your guide to better sleep  a publication by the NIH is  a good source of information).   The referring provider will be notified of the test results.    I certify that I have reviewed the raw data recording prior to the issuance of this report in accordance with the standards of the American Academy of Sleep Medicine (AASM).    INTERPRETING PHYSICIAN: Dedra Gores, MD  [facility] [address] [facility] Sleep Board certified by The [employer] and Diplomate of the [employer] of [employer]. Board certified In Neurology through the ABPN, Fellow of the [employer] of [employer].       "

## 2024-12-26 NOTE — Telephone Encounter (Signed)
-----   Message from Duwaine Russell, NP sent at 12/18/2024  2:48 PM EST ----- Order placed for new CPAP by Dr. Chalice

## 2024-12-26 NOTE — Telephone Encounter (Signed)
 Spoke to patient aware sent order for new cpap machine this afternoon to adapt .initial f/u with Megan,NP 03/2025

## 2025-01-03 ENCOUNTER — Other Ambulatory Visit (HOSPITAL_BASED_OUTPATIENT_CLINIC_OR_DEPARTMENT_OTHER): Payer: Self-pay

## 2025-01-03 MED ORDER — PHENTERMINE HCL 37.5 MG PO TABS
37.5000 mg | ORAL_TABLET | Freq: Every day | ORAL | 0 refills | Status: AC
  Filled 2025-01-03: qty 30, 30d supply, fill #0

## 2025-01-07 ENCOUNTER — Other Ambulatory Visit (HOSPITAL_BASED_OUTPATIENT_CLINIC_OR_DEPARTMENT_OTHER): Payer: Self-pay

## 2025-03-25 ENCOUNTER — Encounter: Admitting: Adult Health
